# Patient Record
Sex: Female | Born: 1950 | Race: White | Hispanic: No | Marital: Married | Smoking: Never smoker
Health system: Southern US, Community
[De-identification: ages and names within clinical notes are randomized; demographics above are authoritative.]

## PROBLEM LIST (undated history)

## (undated) DIAGNOSIS — E079 Disorder of thyroid, unspecified: Secondary | ICD-10-CM

## (undated) DIAGNOSIS — M722 Plantar fascial fibromatosis: Secondary | ICD-10-CM

## (undated) DIAGNOSIS — F419 Anxiety disorder, unspecified: Secondary | ICD-10-CM

## (undated) DIAGNOSIS — K219 Gastro-esophageal reflux disease without esophagitis: Secondary | ICD-10-CM

## (undated) DIAGNOSIS — M1990 Unspecified osteoarthritis, unspecified site: Secondary | ICD-10-CM

## (undated) DIAGNOSIS — K760 Fatty (change of) liver, not elsewhere classified: Secondary | ICD-10-CM

## (undated) DIAGNOSIS — I10 Essential (primary) hypertension: Secondary | ICD-10-CM

## (undated) DIAGNOSIS — R011 Cardiac murmur, unspecified: Secondary | ICD-10-CM

## (undated) DIAGNOSIS — E785 Hyperlipidemia, unspecified: Secondary | ICD-10-CM

## (undated) DIAGNOSIS — Z8739 Personal history of other diseases of the musculoskeletal system and connective tissue: Secondary | ICD-10-CM

## (undated) HISTORY — PX: OTHER SURGICAL HISTORY: SHX169

## (undated) HISTORY — DX: Disorder of thyroid, unspecified: E07.9

## (undated) HISTORY — DX: Cardiac murmur, unspecified: R01.1

## (undated) HISTORY — DX: Gastro-esophageal reflux disease without esophagitis: K21.9

## (undated) HISTORY — DX: Hyperlipidemia, unspecified: E78.5

## (undated) HISTORY — DX: Fatty (change of) liver, not elsewhere classified: K76.0

## (undated) HISTORY — DX: Anxiety disorder, unspecified: F41.9

## (undated) HISTORY — DX: Plantar fascial fibromatosis: M72.2

## (undated) HISTORY — DX: Personal history of other diseases of the musculoskeletal system and connective tissue: Z87.39

## (undated) HISTORY — DX: Essential (primary) hypertension: I10

## (undated) HISTORY — DX: Unspecified osteoarthritis, unspecified site: M19.90

---

## 1982-09-02 HISTORY — PX: OTHER SURGICAL HISTORY: SHX169

## 1999-01-17 ENCOUNTER — Encounter: Admission: RE | Admit: 1999-01-17 | Discharge: 1999-04-17 | Payer: Self-pay

## 2000-02-05 ENCOUNTER — Other Ambulatory Visit: Admission: RE | Admit: 2000-02-05 | Discharge: 2000-02-05 | Payer: Self-pay | Admitting: Family Medicine

## 2001-09-01 ENCOUNTER — Other Ambulatory Visit: Admission: RE | Admit: 2001-09-01 | Discharge: 2001-09-01 | Payer: Self-pay | Admitting: Family Medicine

## 2005-01-24 ENCOUNTER — Other Ambulatory Visit: Admission: RE | Admit: 2005-01-24 | Discharge: 2005-01-24 | Payer: Self-pay | Admitting: Family Medicine

## 2007-11-19 ENCOUNTER — Encounter: Admission: RE | Admit: 2007-11-19 | Discharge: 2007-11-19 | Payer: Self-pay | Admitting: Otolaryngology

## 2009-08-07 ENCOUNTER — Emergency Department (HOSPITAL_COMMUNITY): Admission: EM | Admit: 2009-08-07 | Discharge: 2009-08-07 | Payer: Self-pay | Admitting: Internal Medicine

## 2009-08-08 ENCOUNTER — Encounter: Admission: RE | Admit: 2009-08-08 | Discharge: 2009-08-08 | Payer: Self-pay | Admitting: Orthopedic Surgery

## 2009-09-02 HISTORY — PX: COLONOSCOPY: SHX174

## 2010-01-17 ENCOUNTER — Emergency Department (HOSPITAL_COMMUNITY): Admission: EM | Admit: 2010-01-17 | Discharge: 2010-01-17 | Payer: Self-pay | Admitting: Emergency Medicine

## 2010-05-14 ENCOUNTER — Encounter (INDEPENDENT_AMBULATORY_CARE_PROVIDER_SITE_OTHER): Payer: Self-pay | Admitting: *Deleted

## 2010-05-16 ENCOUNTER — Encounter (INDEPENDENT_AMBULATORY_CARE_PROVIDER_SITE_OTHER): Payer: Self-pay | Admitting: *Deleted

## 2010-05-17 ENCOUNTER — Ambulatory Visit: Payer: Self-pay | Admitting: Gastroenterology

## 2010-05-28 ENCOUNTER — Telehealth: Payer: Self-pay | Admitting: Gastroenterology

## 2010-05-31 ENCOUNTER — Ambulatory Visit: Payer: Self-pay | Admitting: Gastroenterology

## 2010-06-05 ENCOUNTER — Encounter: Payer: Self-pay | Admitting: Gastroenterology

## 2010-09-02 HISTORY — PX: UPPER GASTROINTESTINAL ENDOSCOPY: SHX188

## 2010-10-02 NOTE — Miscellaneous (Signed)
Summary: [facility]  Clinical Lists Changes  Medications: Added new medication of MOVIPREP 100 GM  SOLR (PEG-KCL-NACL-NASULF-NA ASC-C) As per prep instructions. - Signed Rx of MOVIPREP 100 GM  SOLR (PEG-KCL-NACL-NASULF-NA ASC-C) As per prep instructions.;  #1 x 0;  Signed;  Entered by: Ezra Sites RN;  Authorized by: Meryl Dare MD Clementeen Graham;  Method used: Electron[facility] to [facility] [phone]*, [address] [place], [place], [place]  [address], Ph: [phone] or [phone], Fax: [phone] Allergies: Added new allergy or adverse reaction of SULFA Observations: Added new observation of NKA: F (05/17/2010 14:11)    Prescriptions: MOVIPREP 100 GM  SOLR (PEG-KCL-NACL-NASULF-NA ASC-C) As per prep instructions.  #1 x 0   Entered by:   Ezra Sites RN   Authorized by:   Meryl Dare MD [facility]   Signed by:   Ezra Sites RN on 05/17/2010   Method used:   Electronically to        [facility] [phone][facility] (retail)       [address]       [place], [place]  [address]       Ph: [phone] or [phone]       Fax: [phone]   RxID:   [id number]

## 2010-10-02 NOTE — Progress Notes (Signed)
Summary: doc note  Phone Note Call from Patient Call back at [phone]   Caller: Patient Call For: Dr. Russella Dar Reason for Call: Talk to Nurse Summary of Call: would like a note faxed to [phone] excusing her husband, Kelly Lewis, from work 05/31/2010 to be pt's care partner... please call pt when faxed  Initial call taken by: Vallarie Mare,  May 28, 2010 10:56 AM  Follow-up for Phone Call        Told pt that we can do a work excuse for her husband but it does have to be done the day of the procedure and not ahead of time. The pt has to arrive that day before we can give a work note. Pt verbalized understanding and will get it printed the day of the procedure.  Follow-up by: Christie Nottingham CMA Duncan Dull),  May 28, 2010 12:04 PM

## 2010-10-02 NOTE — Letter (Signed)
Summary: [facility] Instructions  [name]  [address] [place], [place] [address]   Phone: [phone]  Fax: [phone]       Kelly Lewis    1951-01-05    MRN: [id number]        Procedure Day /Date: Thursday 05/31/2010     Arrival Time: 8:00AM     Procedure Time: 9:00AM     Location of Procedure:                    _X_  [facility] (4th Floor)                       PREPARATION FOR COLONOSCOPY WITH MOVIPREP   Starting 5 days prior to your procedure 05/26/2010 do not eat nuts, seeds, popcorn, corn, beans, peas,  salads, or any raw vegetables.  Do not take any fiber supplements (e.g. Metamucil, Citrucel, and Benefiber).  THE DAY BEFORE YOUR PROCEDURE         DATE: 09/28  DAY: Thursday  1.  Drink clear liquids the entire day-NO SOLID FOOD  2.  Do not drink anything colored red or purple.  Avoid juices with pulp.  No orange juice.  3.  Drink at least 64 oz. (8 glasses) of fluid/clear liquids during the day to prevent dehydration and help the prep work efficiently.  CLEAR LIQUIDS INCLUDE: Water Jello Ice Popsicles Tea (sugar ok, no milk/cream) Powdered fruit flavored drinks Coffee (sugar ok, no milk/cream) Gatorade Juice: apple, white grape, white cranberry  Lemonade Clear bullion, consomm, broth Carbonated beverages (any kind) Strained chicken noodle soup Hard Candy                             4.  In the morning, mix first dose of MoviPrep solution:    Empty 1 Pouch A and 1 Pouch B into the disposable container    Add lukewarm drinking water to the top line of the container. Mix to dissolve    Refrigerate (mixed solution should be used within 24 hrs)  5.  Begin drinking the prep at 5:00 p.m. The MoviPrep container is divided by 4 marks.   Every 15 minutes drink the solution down to the next mark (approximately 8 oz) until the full liter is complete.   6.  Follow completed prep with 16 oz of clear liquid of your choice (Nothing  red or purple).  Continue to drink clear liquids until bedtime.  7.  Before going to bed, mix second dose of MoviPrep solution:    Empty 1 Pouch A and 1 Pouch B into the disposable container    Add lukewarm drinking water to the top line of the container. Mix to dissolve    Refrigerate  THE DAY OF YOUR PROCEDURE      DATE: 09/29    DAY: Thursday  Beginning at 4:00AM (5 hours before procedure):         1. Every 15 minutes, drink the solution down to the next mark (approx 8 oz) until the full liter is complete.  2. Follow completed prep with 16 oz. of clear liquid of your choice.    3. You may drink clear liquids until 7:00AM (2 HOURS BEFORE PROCEDURE).   MEDICATION INSTRUCTIONS  Unless otherwise instructed, you should take regular prescription medications with a small sip of water   as early as possible the morning of your procedure.  OTHER INSTRUCTIONS  You will need a responsible adult at least 60 years of age to accompany you and drive you home.   This person must remain in the waiting room during your procedure.  Wear loose fitting clothing that is easily removed.  Leave jewelry and other valuables at home.  However, you may wish to bring a book to read or  an iPod/MP3 player to listen to music as you wait for your procedure to start.  Remove all body piercing jewelry and leave at home.  Total time from sign-in until discharge is approximately 2-3 hours.  You should go home directly after your procedure and rest.  You can resume normal activities the  day after your procedure.  The day of your procedure you should not:   Drive   Make legal decisions   Operate machinery   Drink alcohol   Return to work  You will receive specific instructions about eating, activities and medications before you leave.    The above instructions have been reviewed and explained to me by   Ezra Sites RN  May 17, 2010 2:54 PM    I fully understand and  can verbalize these instructions _____________________________ Date _________

## 2010-10-02 NOTE — Letter (Signed)
Summary: Patient Notice- Polyp Results  [facility]  [address] [place], [place] [address]   Phone: [phone]  Fax: [phone]        June 05, 2010 MRN: [id number]    [facility] [address] [place], [place]  [address]    Dear Kelly Lewis,  I am pleased to inform you that the colon polyp(s) removed during your recent colonoscopy was (were) found to be benign (no cancer detected) upon pathologic examination.  I recommend you have a repeat colonoscopy examination in 5 years to look for recurrent polyps, as having colon polyps increases your risk for having recurrent polyps or even colon cancer in the future.  Should you develop new or worsening symptoms of abdominal pain, bowel habit changes or bleeding from the rectum or bowels, please schedule an evaluation with either your primary care physician or with me.  Continue treatment plan as outlined the day of your exam.  Please call us if you are having persistent problems or have questions about your condition that have not been fully answered at this time.  Sincerely,  Kelly Dare MD [facility]  This letter has been electronically signed by your physician.  Appended Document: Patient Notice- Polyp Results letter mailed

## 2010-10-02 NOTE — Procedures (Signed)
Summary: Colonoscopy  Patient: Kelly Lewis Note: All result statuses are Final unless otherwise noted.  Tests: (1) Colonoscopy (COL)   COL Colonoscopy           DONE     [facility]     [address]     [place], [place]  [address]           COLONOSCOPY PROCEDURE REPORT     PATIENT:  Kelly Lewis, Kelly Lewis  MR#:  [id number]     BIRTHDATE:  01-14-1951, 59 yrs. old  GENDER:  female     ENDOSCOPIST:  Judie Petit T. Russella Dar, MD, [facility]     Referred by:  Paulita Cradle, N.P.     PROCEDURE DATE:  05/31/2010     PROCEDURE:  Colonoscopy with biopsy     ASA CLASS:  Class II     INDICATIONS:  1) Routine Risk Screening     MEDICATIONS:   Fentanyl 75 mcg IV, Versed 8 mg IV     DESCRIPTION OF PROCEDURE:   After the risks benefits and     alternatives of the procedure were thoroughly explained, informed     consent was obtained.  Digital rectal exam was performed and     revealed no abnormalities.   The LB [id number]-[id number] endoscope     was introduced through the anus and advanced to the cecum, which     was identified by both the appendix and ileocecal valve, without     limitations.  The quality of the prep was good, using MoviPrep.     The instrument was then slowly withdrawn as the colon was fully     examined.     <<PROCEDUREIMAGES>>     FINDINGS:  A sessile polyp was found in the ascending colon. It     was 4 mm in size. The polyp was removed using cold biopsy forceps.     A normal appearing cecum, ileocecal valve, and appendiceal orifice     were identified. The hepatic flexure, transverse, splenic flexure,     descending, sigmoid colon, and rectum appeared unremarkable.     Retroflexed views in the rectum revealed no abnormalities.  The     time to cecum =  2  minutes. The scope was then withdrawn (time =     8.5  min) from the patient and the procedure completed.           COMPLICATIONS:  None           ENDOSCOPIC IMPRESSION:     1) 4 mm sessile polyp in the ascending colon        RECOMMENDATIONS:     1) Await pathology results     2) If the polyp removed today is adenomatous (pre-cancerous),     repeat colonoscopy in 5 years. Otherwise you should continue to     follow colorectal cancer screening for "routine risk" patients     with colonoscopy in 10 years.           Venita Lick. Russella Dar, MD, Clementeen Graham           n.     eSIGNED:   Venita Lick. Ocie Stanzione at 05/31/2010 09:17 AM           Burley Saver, [id number]  Note: An exclamation mark (!) indicates a result that was not dispersed into the flowsheet. Document Creation Date: 05/31/2010 9:19 AM _______________________________________________________________________  (1) Order result status: Final Collection or observation date-time: 05/31/2010 09:15 Requested date-time:  Receipt date-time:  Reported date-time:  Referring Physician:   Ordering Physician: Claudette Head [phone]) Specimen Source:  Source: Launa Grill Order Number: [id number] Lab site:   Appended Document: Colonoscopy     Procedures Next Due Date:    Colonoscopy: 05/2015

## 2010-10-02 NOTE — Letter (Signed)
Summary: Pre Visit Letter Revised  [facility]  [address] [place], [place] [address]   Phone: [phone]  Fax: [phone]        05/14/2010 MRN: [id number] [facility] [address] [place], [place]  [address]                               Procedure Date:  05-31-10  Welcome to the Gastroenterology Division at [facility].    You are scheduled to see a nurse for your pre-procedure visit on 05-17-10 at 2:30p.m. on the 3rd floor at [facility], [address]  We ask that you try to arrive at our office 15 minutes prior to your appointment time to allow for check-in.  Please take a minute to review the attached form.  If you answer "Yes" to one or more of the questions on the first page, we ask that you call the person listed at your earliest opportunity.  If you answer "No" to all of the questions, please complete the rest of the form and bring it to your appointment.    Your nurse visit will consist of discussing your medical and surgical history, your immediate family medical history, and your medications.   If you are unable to list all of your medications on the form, please bring the medication bottles to your appointment and we will list them.  We will need to be aware of both prescribed and over the counter drugs.  We will need to know exact dosage information as well.    Please be prepared to read and sign documents such as consent forms, a financial agreement, and acknowledgement forms.  If necessary, and with your consent, a friend or relative is welcome to sit-in on the nurse visit with you.  Please bring your insurance card so that we may make a copy of it.  If your insurance requires a referral to see a specialist, please bring your referral form from your primary care physician.  No co-pay is required for this nurse visit.     If you cannot keep your appointment, please call [phone] to cancel or reschedule prior to your appointment date.  This  allows [place] the opportunity to schedule an appointment for another patient in need of care.    Thank you for choosing [facility] for your medical needs.  We appreciate the opportunity to care for you.  Please visit [place] at our website  to learn more about our practice.  Sincerely, The Gastroenterology Division

## 2010-11-19 LAB — URINE CULTURE

## 2010-11-19 LAB — COMPREHENSIVE METABOLIC PANEL
ALT: 46 U/L — ABNORMAL HIGH (ref 0–35)
AST: 42 U/L — ABNORMAL HIGH (ref 0–37)
BUN: 13 mg/dL (ref 6–23)
CO2: 19 mEq/L (ref 19–32)
Creatinine, Ser: 0.89 mg/dL (ref 0.4–1.2)
Glucose, Bld: 131 mg/dL — ABNORMAL HIGH (ref 70–99)
Total Bilirubin: 1.2 mg/dL (ref 0.3–1.2)
Total Protein: 8.3 g/dL (ref 6.0–8.3)

## 2010-11-19 LAB — URINALYSIS, ROUTINE W REFLEX MICROSCOPIC
Glucose, UA: NEGATIVE mg/dL
Nitrite: POSITIVE — AB
Protein, ur: NEGATIVE mg/dL

## 2010-11-19 LAB — DIFFERENTIAL
Eosinophils Relative: 0 % (ref 0–5)
Lymphs Abs: 2.3 10*3/uL (ref 0.7–4.0)
Neutrophils Relative %: 71 % (ref 43–77)

## 2010-11-19 LAB — CBC
Hemoglobin: 15.1 g/dL — ABNORMAL HIGH (ref 12.0–15.0)
Platelets: 205 10*3/uL (ref 150–400)
RBC: 4.97 MIL/uL (ref 3.87–5.11)

## 2010-11-24 ENCOUNTER — Emergency Department (HOSPITAL_COMMUNITY): Payer: 59

## 2010-11-24 ENCOUNTER — Observation Stay (HOSPITAL_COMMUNITY)
Admission: EM | Admit: 2010-11-24 | Discharge: 2010-11-25 | Disposition: A | Discharge status: Home / Self Care | Payer: 59 | Attending: Internal Medicine | Admitting: Internal Medicine

## 2010-11-24 DIAGNOSIS — I10 Essential (primary) hypertension: Secondary | ICD-10-CM | POA: Insufficient documentation

## 2010-11-24 DIAGNOSIS — R079 Chest pain, unspecified: Principal | ICD-10-CM | POA: Insufficient documentation

## 2010-11-24 DIAGNOSIS — K7689 Other specified diseases of liver: Secondary | ICD-10-CM | POA: Insufficient documentation

## 2010-11-24 DIAGNOSIS — E785 Hyperlipidemia, unspecified: Secondary | ICD-10-CM | POA: Insufficient documentation

## 2010-11-24 DIAGNOSIS — K219 Gastro-esophageal reflux disease without esophagitis: Secondary | ICD-10-CM | POA: Insufficient documentation

## 2010-11-24 DIAGNOSIS — Z79899 Other long term (current) drug therapy: Secondary | ICD-10-CM | POA: Insufficient documentation

## 2010-11-24 DIAGNOSIS — E039 Hypothyroidism, unspecified: Secondary | ICD-10-CM | POA: Insufficient documentation

## 2010-11-24 DIAGNOSIS — R112 Nausea with vomiting, unspecified: Secondary | ICD-10-CM | POA: Insufficient documentation

## 2010-11-24 LAB — POCT I-STAT, CHEM 8
BUN: 12 mg/dL (ref 6–23)
Chloride: 103 mEq/L (ref 96–112)
Creatinine, Ser: 0.9 mg/dL (ref 0.4–1.2)
HCT: 43 % (ref 36.0–46.0)
Hemoglobin: 14.6 g/dL (ref 12.0–15.0)
Potassium: 3.9 mEq/L (ref 3.5–5.1)

## 2010-11-24 LAB — CBC
HCT: 40.4 % (ref 36.0–46.0)
Hemoglobin: 13.6 g/dL (ref 12.0–15.0)
MCH: 30 pg (ref 26.0–34.0)
RDW: 12.7 % (ref 11.5–15.5)
WBC: 8.1 10*3/uL (ref 4.0–10.5)

## 2010-11-24 LAB — DIFFERENTIAL
Basophils Absolute: 0 10*3/uL (ref 0.0–0.1)
Eosinophils Absolute: 0 10*3/uL (ref 0.0–0.7)
Eosinophils Relative: 0 % (ref 0–5)
Lymphocytes Relative: 4 % — ABNORMAL LOW (ref 12–46)
Monocytes Relative: 2 % — ABNORMAL LOW (ref 3–12)
Neutrophils Relative %: 93 % — ABNORMAL HIGH (ref 43–77)

## 2010-11-24 LAB — HEPATIC FUNCTION PANEL
Albumin: 3.9 g/dL (ref 3.5–5.2)
Alkaline Phosphatase: 137 U/L — ABNORMAL HIGH (ref 39–117)
Bilirubin, Direct: 0.1 mg/dL (ref 0.0–0.3)
Indirect Bilirubin: 0.6 mg/dL (ref 0.3–0.9)
Total Protein: 7.5 g/dL (ref 6.0–8.3)

## 2010-11-24 LAB — BRAIN NATRIURETIC PEPTIDE: Pro B Natriuretic peptide (BNP): <30 pg/mL (ref 0.0–100.0)

## 2010-11-24 LAB — POCT CARDIAC MARKERS

## 2010-11-25 ENCOUNTER — Other Ambulatory Visit: Payer: Self-pay | Admitting: Internal Medicine

## 2010-11-25 DIAGNOSIS — R072 Precordial pain: Secondary | ICD-10-CM

## 2010-11-25 LAB — COMPREHENSIVE METABOLIC PANEL
AST: 21 U/L (ref 0–37)
Albumin: 3.2 g/dL — ABNORMAL LOW (ref 3.5–5.2)
CO2: 26 mEq/L (ref 19–32)
Calcium: 8.7 mg/dL (ref 8.4–10.5)
Creatinine, Ser: 0.74 mg/dL (ref 0.4–1.2)
GFR calc non Af Amer: >60 mL/min (ref >60)
Total Protein: 6 g/dL (ref 6.0–8.3)

## 2010-11-25 LAB — CBC
HCT: 35.1 % — ABNORMAL LOW (ref 36.0–46.0)
Hemoglobin: 11.5 g/dL — ABNORMAL LOW (ref 12.0–15.0)
MCV: 90.2 fL (ref 78.0–100.0)
RBC: 3.89 MIL/uL (ref 3.87–5.11)
WBC: 4.7 10*3/uL (ref 4.0–10.5)

## 2010-11-25 LAB — TSH: TSH: 0.399 u[IU]/mL (ref 0.350–4.500)

## 2010-11-25 LAB — TROPONIN I: Troponin I: <0.01 ng/mL (ref 0.00–0.06)

## 2010-11-25 LAB — LIPID PANEL
Cholesterol: 146 mg/dL (ref 0–200)
Triglycerides: 44 mg/dL (ref <150)

## 2010-11-25 LAB — CK TOTAL AND CKMB (NOT AT ARMC)
CK, MB: 0.7 ng/mL (ref 0.3–4.0)
CK, MB: 0.8 ng/mL (ref 0.3–4.0)
Total CK: 66 U/L (ref 7–177)

## 2010-11-25 LAB — PHOSPHORUS: Phosphorus: 3.7 mg/dL (ref 2.3–4.6)

## 2010-11-28 ENCOUNTER — Encounter: Payer: Self-pay | Admitting: Gastroenterology

## 2010-12-04 NOTE — Letter (Signed)
Summary: New Patient letter  [facility]  [address] [place], [place] [address]   Phone: [phone]  Fax: [phone]       11/28/2010 MRN: [id number]  [facility] [address] [place], [place]  [address]  Dear Ms. Snowden,  Welcome to the Gastroenterology Division at [facility].    You are scheduled to see Dr.  Russella Dar on 01-07-11 at 3pm on the 3rd floor at [facility], [address].  We ask that you try to arrive at our office 15 minutes prior to your appointment time to allow for check-in.  We would like you to complete the enclosed self-administered evaluation form prior to your visit and bring it with you on the day of your appointment.  We will review it with you.  Also, please bring a complete list of all your medications or, if you prefer, bring the medication bottles and we will list them.  Please bring your insurance card so that we may make a copy of it.  If your insurance requires a referral to see a specialist, please bring your referral form from your primary care physician.  Co-payments are due at the time of your visit and may be paid by cash, check or credit card.     Your office visit will consist of a consult with your physician (includes a physical exam), any laboratory testing he/she may order, scheduling of any necessary diagnostic testing (e.g. x-ray, ultrasound, CT-scan), and scheduling of a procedure (e.g. Endoscopy, Colonoscopy) if required.  Please allow enough time on your schedule to allow for any/all of these possibilities.    If you cannot keep your appointment, please call [phone] to cancel or reschedule prior to your appointment date.  This allows [place] the opportunity to schedule an appointment for another patient in need of care.  If you do not cancel or reschedule by 5 p.m. the business day prior to your appointment date, you will be charged a $50.00 late cancellation/no-show fee.    Thank you for choosing [facility] for your medical needs.  We appreciate the opportunity to care for you.  Please visit [place] at our website  to learn more about our practice.                     Sincerely,                                                             The Gastroenterology Division

## 2010-12-18 NOTE — H&P (Signed)
Kelly Lewis, Kelly Lewis                 ACCOUNT NO.:  [id number]  MEDICAL RECORD NO.:  [id number]           PATIENT TYPE:  O  LOCATION:  1425                         FACILITY:  [facility]  PHYSICIAN:  Lonia Blood, M.D.      DATE OF BIRTH:  Jan 25, 1951  DATE OF ADMISSION:  11/24/2010 DATE OF DISCHARGE:                             HISTORY & PHYSICAL   PRIMARY CARE PHYSICIAN:  The patient is unassigned to [place].  PRESENTING COMPLAINT:  Chest pain.  HISTORY OF PRESENT ILLNESS:  The patient is a 60 year old female with history of hypertension and hypothyroidism who presented with chest pain plus flutter all of this week.  She saw her primary care physician who performed an EKG at the office that showed it was normal.  She was scheduled to have a 2-D echo this coming week.  Chest pain, however, continued and got worse, so she decided to come in for further management.  She rated the pain as 8/10 at its highest.  It was relieved somehow with some nitroglycerin here in the ED and she is currently chest pain-free.  She denied any diaphoresis.  No radiation.  No dizziness, but she has been having nausea and has had some vomiting even here in the ED.  Chest pain is more discomfort and at this height of her discomfort she did feel it radiate into her shoulder and jaw, but not today.  She has risk factors for coronary artery disease, mainly obesity, hypertension.  PAST MEDICAL HISTORY:  Significant for: 1. Hypothyroidism. 2. Hypertension. 3. GERD. 4. Morbid obesity.  ALLERGIES:  SULFA.  CURRENT MEDICATIONS: 1. Levothyroxine 100 mcg daily. 2. Lansoprazole 600 mg daily. 3. Lorazepam 0.5 mg daily. 4. Lisinopril 5 mg daily.  SOCIAL HISTORY:  She is married and lives in [place] with her husband. She denied any tobacco.  No alcohol.  No IV drug use.  FAMILY HISTORY:  Denied any significant family history for coronary artery disease.  REVIEW OF SYSTEMS:  All systems reviewed are currently negative  except per HPI.  PHYSICAL EXAMINATION:  VITAL SIGNS:  Temperature 98.6, initial blood pressure 138/72 with a pulse 113, respiratory 18, sats 98% room air. GENERAL:  She is awake, alert, oriented, obese woman.  She is in no acute distress. HEENT:  PERRL.  EOMI.  No pallor.  No jaundice.  No rhinorrhea. NECK:  Supple.  No JVD.  No lymphadenopathy. RESPIRATORY:  She has good air entry bilaterally.  No wheezes.  No rales.  No crackles. CARDIOVASCULAR SYSTEM:  She is slightly tachycardic. ABDOMEN:  Obese, soft, full, nontender with positive bowel sounds. EXTREMITIES:  No edema, cyanosis, or clubbing. MUSCULOSKELETAL:  No joint deformities or tenderness. SKIN:  No rashes.  No ulcers.  LABORATORY DATA:  Her white count is 8.1, hemoglobin 13.6 with platelet count 152.  Sodium 137, potassium 3.9, chloride 106, BUN 12, creatinine 0.9, glucose 124.  Her LFTs showed only mildly elevated alkaline phosphatase.  Initial cardiac enzymes are negative.  BNP is less than 30.  Chest x-ray showed negative exam.  Abdominal ultrasound showed fatty infiltration of the liver, but no acute  findings.  EKG showed normal sinus rhythm with a rate of 93, is low voltage EKG, no significant ST-T wave changes.  ASSESSMENT:  This is a 60 year old female presenting with chest pain. The patient has some mild to moderate risk factors for coronary artery disease, but she given a classic cardiac chest pain pattern.  PLAN: 1. Chest pain.  We will admit the patient for observation and rule out     MI.  I will check serial cardiac enzymes.  I will give her     nitroglycerin sublingual as needed and some morphine for pain     control.  Put her on some oxygen as well.  I will put her on some     Lovenox for DVT prophylaxis mainly.  I suspect if this is negative     the patient will need an echo as well as an outpatient stress test. 2. GERD.  We will put her on PPIs.  I will put her on b.i.d. due to     her symptoms does  seem to be GERD related. 3. Hypothyroidism.  Check TSH and continue with her Synthroid. 4. Mild dyslipidemia per patient, which is an additional risk factor.     I will check fasting lipid and if we have to we will start her on a     statin.  She says she is currently on diet control. 5. Hypertension.  Blood pressure is controlled on her home regimen. 6. Nausea, vomiting.  Again this could be related to her GI     symptomatology.  The patient was worried about gallbladder.  So far     her right upper quadrant abdominal ultrasound is negative for any     gallbladder disease.  We will continue with symptomatic management     of her nausea and vomiting.     Lonia Blood, M.D.     Verlin Grills  D:  11/25/2010  T:  11/25/2010  Job:  [id number]  Electronically Signed by Lonia Blood M.D. on 12/18/2010 03:43:43 PM

## 2010-12-27 ENCOUNTER — Other Ambulatory Visit: Payer: Self-pay | Admitting: Gastroenterology

## 2011-01-07 ENCOUNTER — Ambulatory Visit: Payer: 59 | Admitting: Gastroenterology

## 2012-02-22 IMAGING — US US ABDOMEN COMPLETE
1 series · 14 of 25 positions shown · non-contrast
Comparison: None

CLINICAL DATA: Epigastric pain.

COMPLETE ABDOMINAL ULTRASOUND

[Series 1: us abdomen complete · 0.30mm/px · 14 of 59 slices shown]
[im 1/59]
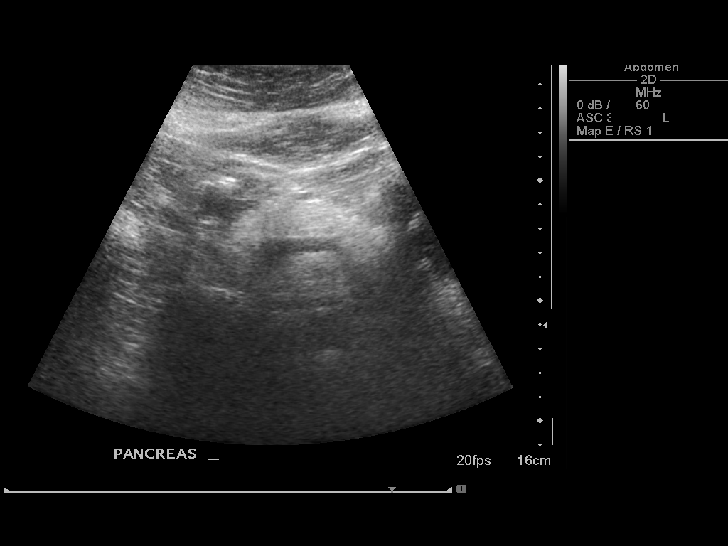
[im 5/59]
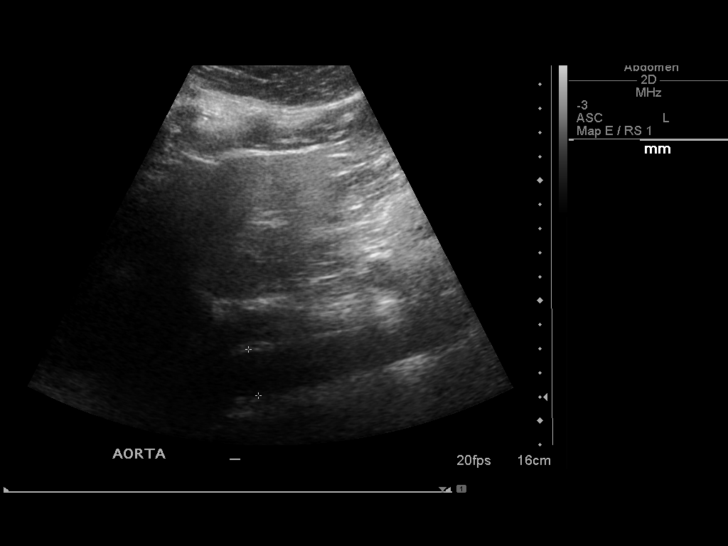
[im 10/59]
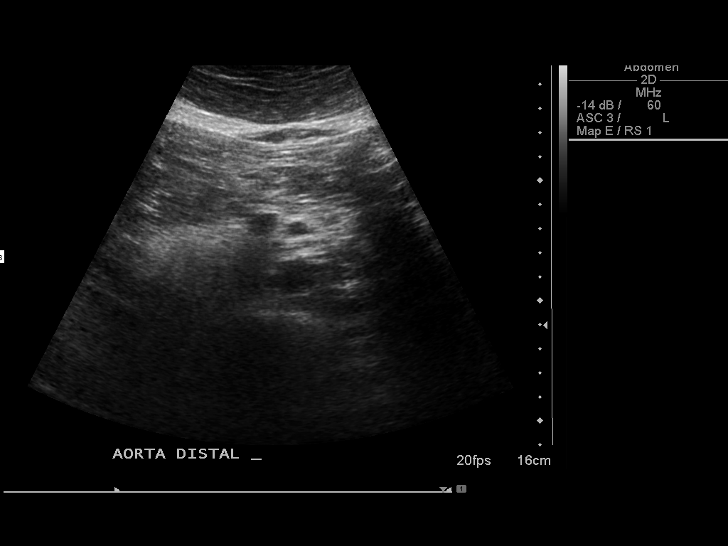
[im 15/59]
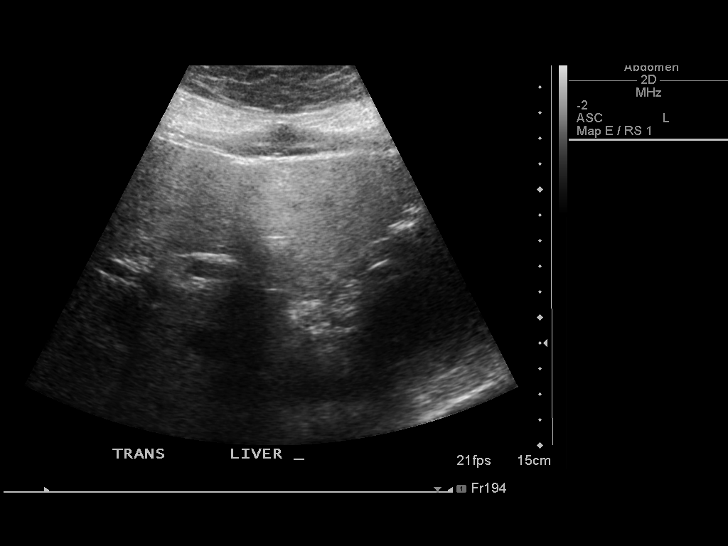
[im 20/59]
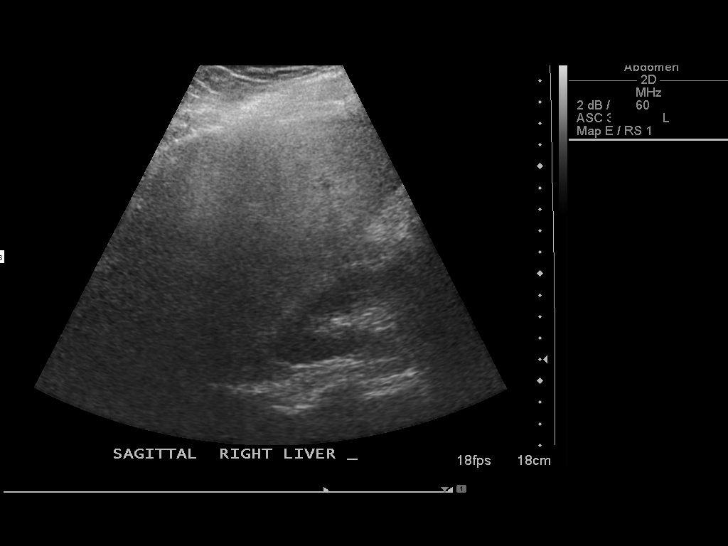
[im 22/59]
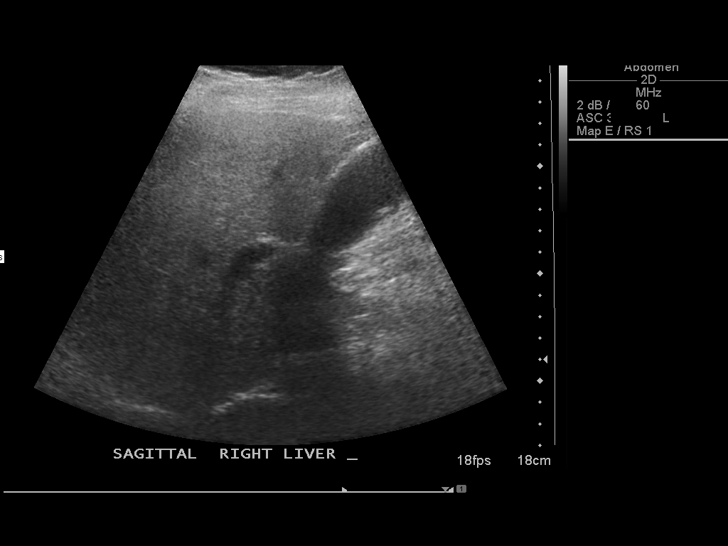
[im 27/59]
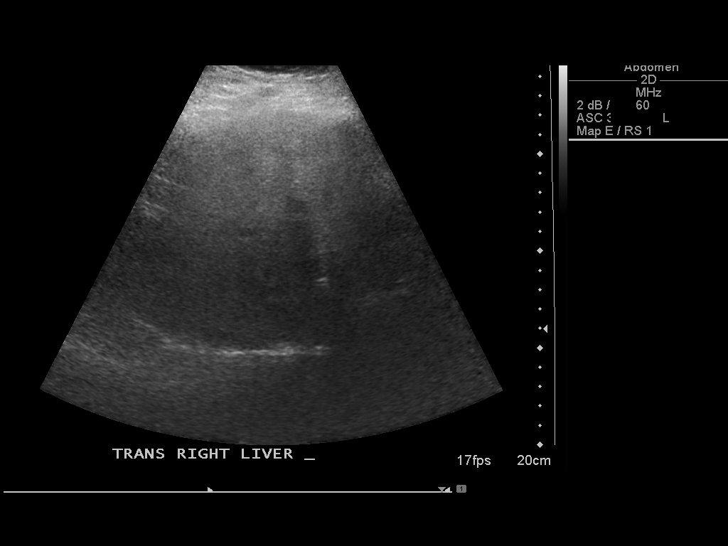
[im 32/59]
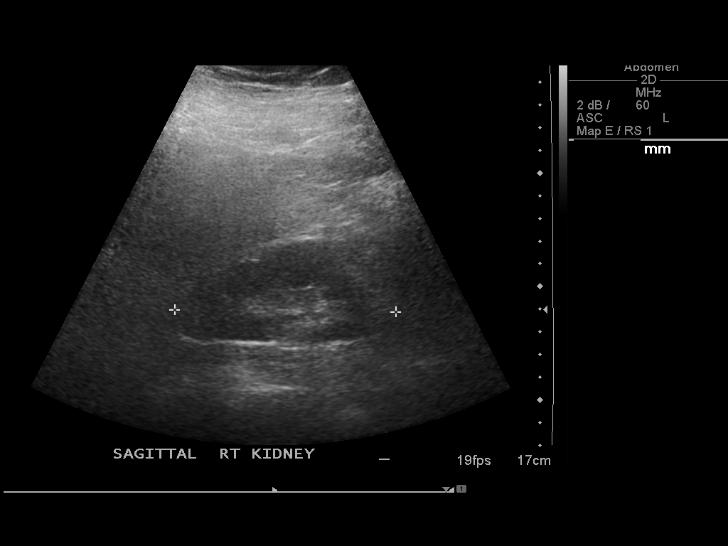
[im 37/59]
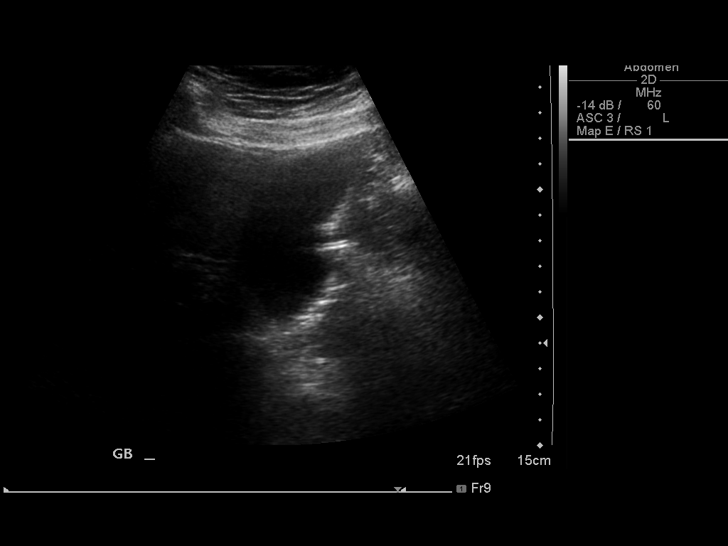
[im 39/59]
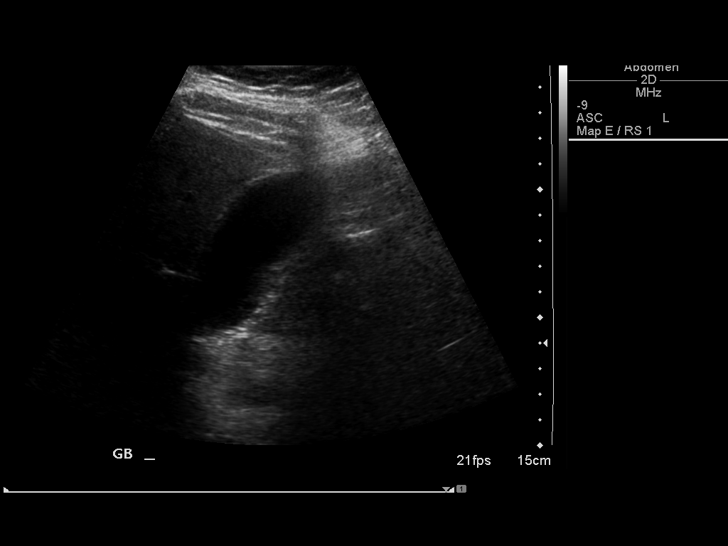
[im 44/59]
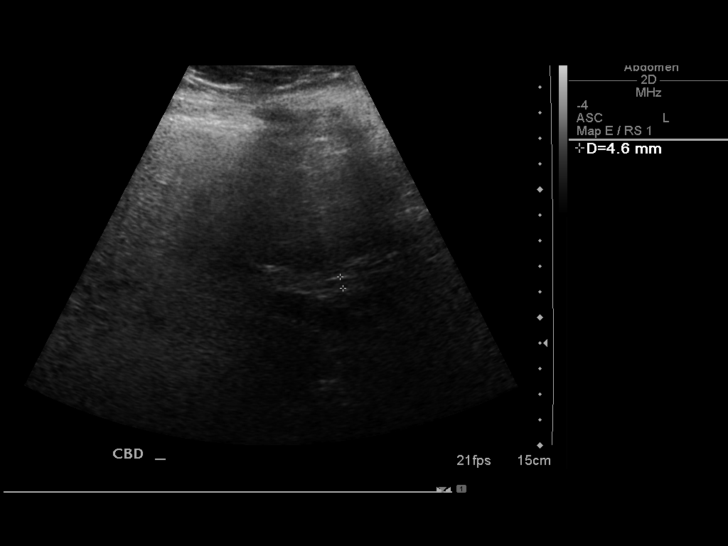
[im 49/59]
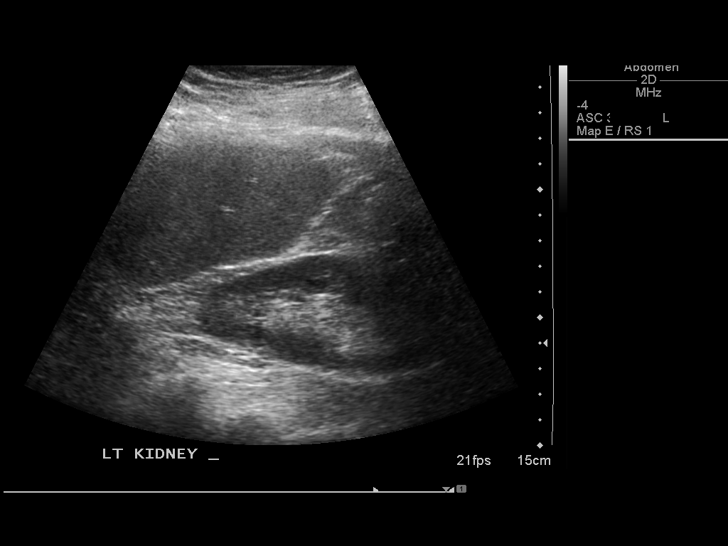
[im 54/59]
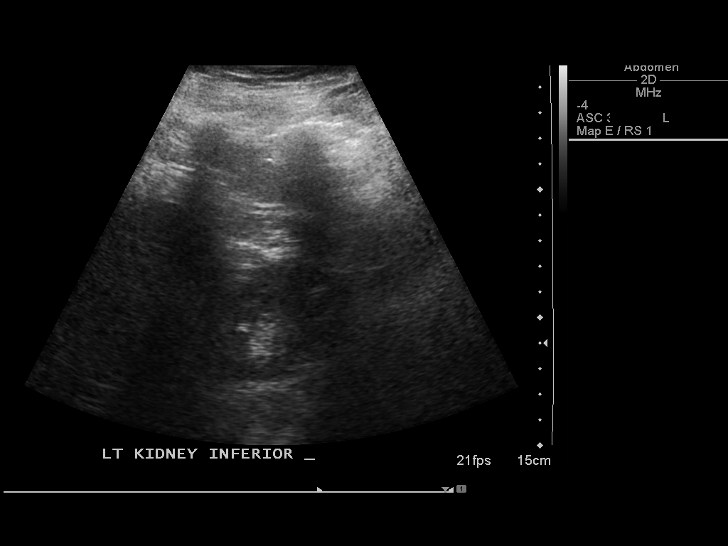
[im 59/59]
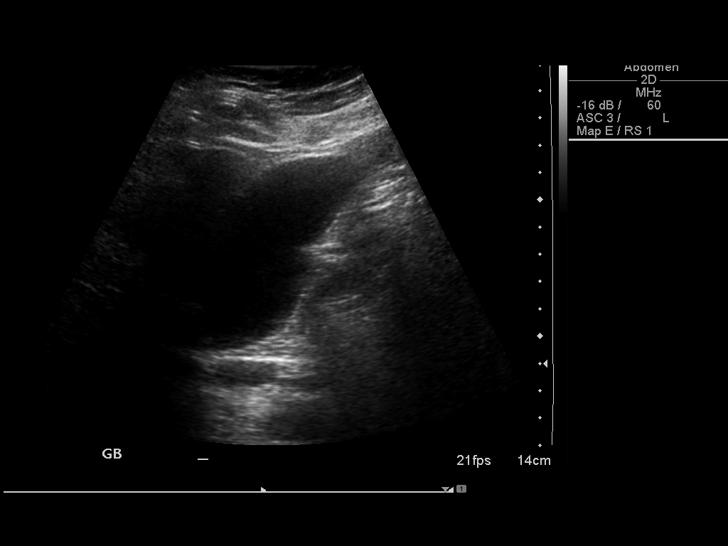

[14 of 25 positions shown; findings below may reference images not displayed]

FINDINGS: Gallbladder:  No gallstones, gallbladder wall thickening, or
pericholecystic fluid.

Common bile duct:   Within normal limits in caliber.

Liver:  Coarsened, increased echotexture compatible with fatty
infiltration or intrinsic liver disease.  There is no biliary
ductal dilatation.

IVC:  Appears normal.

Pancreas:  No focal abnormality seen.

Spleen:  Within normal limits in size and echotexture.

Right Kidney:   Normal in size and parenchymal echogenicity.  No
evidence of mass or hydronephrosis.

Left Kidney:  Normal in size and parenchymal echogenicity.  No
evidence of mass or hydronephrosis.

Abdominal aorta:  No aneurysm identified.
IMPRESSION: Fatty infiltration of the liver.  No acute findings.

## 2012-12-09 ENCOUNTER — Other Ambulatory Visit: Payer: Self-pay | Admitting: *Deleted

## 2012-12-09 MED ORDER — LEVOTHYROXINE SODIUM 75 MCG PO TABS: 75.0000 ug | ORAL_TABLET | Freq: Every day | ORAL | Status: DC

## 2012-12-21 ENCOUNTER — Telehealth: Payer: Self-pay | Admitting: Nurse Practitioner

## 2012-12-21 MED ORDER — DEXLANSOPRAZOLE 60 MG PO CPDR: 60.0000 mg | DELAYED_RELEASE_CAPSULE | Freq: Every day | ORAL | Status: DC

## 2012-12-21 NOTE — Telephone Encounter (Signed)
Done 12/21/12

## 2013-01-07 ENCOUNTER — Encounter: Payer: Self-pay | Admitting: Nurse Practitioner

## 2013-01-07 ENCOUNTER — Ambulatory Visit (INDEPENDENT_AMBULATORY_CARE_PROVIDER_SITE_OTHER): Payer: 59 | Admitting: Nurse Practitioner

## 2013-01-07 VITALS — BP 118/67 | HR 79 | Temp 98.4°F | Ht 66.5 in | Wt 226.0 lb

## 2013-01-07 DIAGNOSIS — E785 Hyperlipidemia, unspecified: Secondary | ICD-10-CM

## 2013-01-07 DIAGNOSIS — I10 Essential (primary) hypertension: Secondary | ICD-10-CM

## 2013-01-07 DIAGNOSIS — K219 Gastro-esophageal reflux disease without esophagitis: Secondary | ICD-10-CM

## 2013-01-07 DIAGNOSIS — E039 Hypothyroidism, unspecified: Secondary | ICD-10-CM | POA: Insufficient documentation

## 2013-01-07 DIAGNOSIS — F411 Generalized anxiety disorder: Secondary | ICD-10-CM

## 2013-01-07 LAB — COMPLETE METABOLIC PANEL WITH GFR
ALT: 46 U/L — ABNORMAL HIGH (ref 0–35)
CO2: 26 mEq/L (ref 19–32)
Calcium: 10.2 mg/dL (ref 8.4–10.5)
Chloride: 100 mEq/L (ref 96–112)
Creat: 0.91 mg/dL (ref 0.50–1.10)
GFR, Est African American: 78 mL/min
Glucose, Bld: 92 mg/dL (ref 70–99)
Total Protein: 7.7 g/dL (ref 6.0–8.3)

## 2013-01-07 LAB — THYROID PANEL WITH TSH: TSH: 8.243 u[IU]/mL — ABNORMAL HIGH (ref 0.350–4.500)

## 2013-01-07 MED ORDER — LISINOPRIL 5 MG PO TABS: 5.0000 mg | ORAL_TABLET | Freq: Every day | ORAL | Status: DC

## 2013-01-07 MED ORDER — LORAZEPAM 0.5 MG PO TABS: 0.5000 mg | ORAL_TABLET | Freq: Two times a day (BID) | ORAL | Status: DC | PRN

## 2013-01-07 MED ORDER — DEXLANSOPRAZOLE 60 MG PO CPDR: 60.0000 mg | DELAYED_RELEASE_CAPSULE | Freq: Every day | ORAL | Status: DC

## 2013-01-07 NOTE — Patient Instructions (Signed)

## 2013-01-07 NOTE — Progress Notes (Signed)
Subjective:    Patient ID: Kelly Lewis, female    DOB: 29-Nov-1950, 62 y.o.   MRN: [id number]  Hypertension This is a chronic problem. The current episode started more than 1 year ago. The problem has been resolved since onset. The problem is controlled. Associated symptoms include anxiety. Pertinent negatives include no blurred vision, chest pain, headaches, palpitations, peripheral edema or shortness of breath. Agents associated with hypertension include thyroid hormones. Risk factors for coronary artery disease include obesity. Past treatments include ACE inhibitors. The current treatment provides significant improvement. Compliance problems include exercise and diet.   Gastrophageal Reflux She reports no chest pain, no coughing, no dysphagia, no heartburn, no sore throat or no wheezing. This is a chronic problem. The current episode started more than 1 year ago. The problem occurs rarely. The problem has been waxing and waning. The symptoms are aggravated by certain foods (spicy). She has tried a histamine-2 antagonist for the symptoms. The treatment provided moderate relief. Past procedures include an EGD. Past procedures do not include H. pylori antibody titer.  hyperlipidemia They wanted patient to start to medication at last check but patient refused. Has been dieting and wants to see if has improved. Hasn't been exercising very often GAD Mother is 60 years old and she is helping take care of her which makes her very anxious Hypothyroidism Levothyroxine daily- Tolerating well- No C/O fatigue.  Review of Systems  HENT: Negative for sore throat.   Eyes: Negative for blurred vision.  Respiratory: Negative for cough, shortness of breath and wheezing.   Cardiovascular: Negative for chest pain and palpitations.  Gastrointestinal: Negative for heartburn and dysphagia.  Neurological: Negative for headaches.  All other systems reviewed and are negative.       Objective:   Physical  Exam  Constitutional: She is oriented to person, place, and time. She appears well-developed and well-nourished.  HENT:  Nose: Nose normal.  Mouth/Throat: Oropharynx is clear and moist.  Eyes: EOM are normal.  Neck: Trachea normal, normal range of motion and full passive range of motion without pain. Neck supple. No JVD present. Carotid bruit is not present. No thyromegaly present.  Cardiovascular: Normal rate, regular rhythm, normal heart sounds and intact distal pulses.  Exam reveals no gallop and no friction rub.   No murmur heard. Pulmonary/Chest: Effort normal and breath sounds normal.  Abdominal: Soft. Bowel sounds are normal. She exhibits no distension and no mass. There is no tenderness.  Musculoskeletal: Normal range of motion.  Lymphadenopathy:    She has no cervical adenopathy.  Neurological: She is alert and oriented to person, place, and time. She has normal reflexes.  Skin: Skin is warm and dry.  Psychiatric: She has a normal mood and affect. Her behavior is normal. Judgment and thought content normal.   BP 118/67  Pulse 79  Temp(Src) 98.4 F (36.9 C) (Oral)  Ht 5' 6.5" (1.689 m)  Wt 226 lb (102.513 kg)  BMI 35.94 kg/m2        Assessment & Plan:  1. GAD (generalized anxiety disorder) Stress management - LORazepam (ATIVAN) 0.5 MG tablet; Take 1 tablet (0.5 mg total) by mouth 2 (two) times daily as needed for anxiety.  Dispense: 30 tablet; Refill: 0  2. Hypertension Low Na+ diet - COMPLETE METABOLIC PANEL WITH GFR - lisinopril (PRINIVIL,ZESTRIL) 5 MG tablet; Take 1 tablet (5 mg total) by mouth daily.  Dispense: 90 tablet; Refill: 1  3. Hypothyroidism  - Thyroid Panel With TSH  4. GERD (  gastroesophageal reflux disease) Avoid spicy and fatty foods Do Not eat 2 hours prior to bedtime - dexlansoprazole (DEXILANT) 60 MG capsule; Take 1 capsule (60 mg total) by mouth daily.  Dispense: 30 capsule; Refill: 5  5. Hyperlipidemia Low fat diet an dexercise - NMR  Lipoprofile with Lipids  Mary-Margaret Daphine Deutscher, FNP

## 2013-01-12 ENCOUNTER — Other Ambulatory Visit: Payer: Self-pay | Admitting: Nurse Practitioner

## 2013-01-12 LAB — NMR LIPOPROFILE WITH LIPIDS
HDL Particle Number: 29.8 umol/L — ABNORMAL LOW (ref >=30.5)
LDL Size: 21.5 nm (ref >20.5)
Large HDL-P: 6.1 umol/L (ref >=4.8)
Small LDL Particle Number: 477 nmol/L (ref <=527)

## 2013-01-12 MED ORDER — LEVOTHYROXINE SODIUM 100 MCG PO TABS: 100.0000 ug | ORAL_TABLET | Freq: Every day | ORAL | Status: DC

## 2013-01-12 MED ORDER — ATORVASTATIN CALCIUM 40 MG PO TABS: 40.0000 mg | ORAL_TABLET | Freq: Every day | ORAL | Status: DC

## 2013-01-13 ENCOUNTER — Telehealth: Payer: Self-pay | Admitting: Nurse Practitioner

## 2013-01-13 NOTE — Telephone Encounter (Signed)
Spoke with patient.

## 2013-01-13 NOTE — Progress Notes (Signed)
We will keep a close eye on her liver- Ok to continue both meds

## 2013-01-20 ENCOUNTER — Encounter: Payer: Self-pay | Admitting: Nurse Practitioner

## 2013-01-20 NOTE — Telephone Encounter (Signed)
Please advise 

## 2013-01-28 ENCOUNTER — Encounter: Payer: Self-pay | Admitting: Nurse Practitioner

## 2013-02-12 ENCOUNTER — Encounter: Payer: Self-pay | Admitting: Nurse Practitioner

## 2013-02-12 ENCOUNTER — Other Ambulatory Visit: Payer: Self-pay | Admitting: *Deleted

## 2013-02-12 DIAGNOSIS — F411 Generalized anxiety disorder: Secondary | ICD-10-CM

## 2013-02-12 MED ORDER — LORAZEPAM 0.5 MG PO TABS: 0.5000 mg | ORAL_TABLET | Freq: Two times a day (BID) | ORAL | Status: DC | PRN

## 2013-02-12 NOTE — Telephone Encounter (Signed)
Message from [facility],   As you requested I am letting you know I need a prescription called in for Lorazepam 0.5. The last prescription was for only 15 days. Thank you and have a good day.   Burley Saver

## 2013-02-12 NOTE — Telephone Encounter (Signed)
Please call in ativan rx with 1 refill 

## 2013-02-12 NOTE — Telephone Encounter (Signed)
Up front 

## 2013-03-02 ENCOUNTER — Other Ambulatory Visit (INDEPENDENT_AMBULATORY_CARE_PROVIDER_SITE_OTHER): Payer: 59

## 2013-03-02 DIAGNOSIS — E039 Hypothyroidism, unspecified: Secondary | ICD-10-CM

## 2013-03-02 LAB — THYROID PANEL WITH TSH
Free Thyroxine Index: 4.4 — ABNORMAL HIGH (ref 1.0–3.9)
T3 Uptake: 29.9 % (ref 22.5–37.0)
T4, Total: 14.8 ug/dL — ABNORMAL HIGH (ref 5.0–12.5)
TSH: 2.809 u[IU]/mL (ref 0.350–4.500)

## 2013-05-11 ENCOUNTER — Other Ambulatory Visit: Payer: Self-pay | Admitting: Nurse Practitioner

## 2013-05-13 NOTE — Telephone Encounter (Signed)
LAST RF 03/31/13. LAST OV 01/07/13. CALL IN [facility] IF APPROVED.

## 2013-05-13 NOTE — Telephone Encounter (Signed)
Please call in xanax rx with 1 refill 

## 2013-05-14 ENCOUNTER — Other Ambulatory Visit: Payer: Self-pay | Admitting: Nurse Practitioner

## 2013-05-14 NOTE — Telephone Encounter (Signed)
[facility] 

## 2013-05-17 NOTE — Telephone Encounter (Signed)
Last seen 01/07/13  MMM   If approved route to nurse to [facility]

## 2013-05-17 NOTE — Telephone Encounter (Signed)
Please call in ativan rx with 1 refill 

## 2013-05-17 NOTE — Telephone Encounter (Signed)
[facility] 

## 2013-07-06 ENCOUNTER — Other Ambulatory Visit: Payer: Self-pay | Admitting: Nurse Practitioner

## 2013-07-08 ENCOUNTER — Other Ambulatory Visit: Payer: Self-pay

## 2013-07-08 ENCOUNTER — Other Ambulatory Visit: Payer: Self-pay | Admitting: Nurse Practitioner

## 2013-07-09 ENCOUNTER — Other Ambulatory Visit: Payer: Self-pay | Admitting: Nurse Practitioner

## 2013-09-22 ENCOUNTER — Other Ambulatory Visit: Payer: Self-pay | Admitting: Nurse Practitioner

## 2013-09-24 ENCOUNTER — Telehealth: Payer: Self-pay | Admitting: Nurse Practitioner

## 2013-09-24 MED ORDER — LORAZEPAM 0.5 MG PO TABS: 0.5000 mg | ORAL_TABLET | Freq: Two times a day (BID) | ORAL | Status: DC

## 2013-09-24 NOTE — Telephone Encounter (Signed)
Please call in lorazepam 0.5 mg 1 po BID prn #60 0 refill- NTBS for follow up

## 2013-09-24 NOTE — Telephone Encounter (Signed)
[facility]

## 2013-10-07 ENCOUNTER — Other Ambulatory Visit: Payer: Self-pay | Admitting: Nurse Practitioner

## 2013-10-11 NOTE — Telephone Encounter (Signed)
ntbs

## 2013-10-11 NOTE — Telephone Encounter (Signed)
Last seen 01/07/13  MMM Requesting a 90 day supply

## 2013-10-15 ENCOUNTER — Ambulatory Visit: Payer: 59 | Admitting: Nurse Practitioner

## 2013-10-19 ENCOUNTER — Ambulatory Visit: Payer: BC Managed Care – PPO | Admitting: Nurse Practitioner

## 2013-10-23 ENCOUNTER — Encounter: Payer: Self-pay | Admitting: Family Medicine

## 2013-11-10 ENCOUNTER — Other Ambulatory Visit: Payer: Self-pay | Admitting: Nurse Practitioner

## 2013-11-11 NOTE — Telephone Encounter (Signed)
Please call in lorazepam with 1 refills 

## 2013-11-11 NOTE — Telephone Encounter (Signed)
rx called into pharmacy

## 2013-11-11 NOTE — Telephone Encounter (Signed)
Patient last seen in office on 01-07-13. Rx last filled on 09-24-13 for #60. Please advise. If approved please route to Pool B so nurse can phone in to pharmacy

## 2014-01-04 ENCOUNTER — Other Ambulatory Visit: Payer: Self-pay | Admitting: *Deleted

## 2014-01-04 MED ORDER — LEVOTHYROXINE SODIUM 100 MCG PO TABS: ORAL_TABLET | ORAL | Status: DC

## 2014-01-17 ENCOUNTER — Other Ambulatory Visit: Payer: Self-pay

## 2014-01-17 MED ORDER — LISINOPRIL 5 MG PO TABS: ORAL_TABLET | ORAL | Status: DC

## 2014-01-17 NOTE — Telephone Encounter (Signed)
Last seen 01/07/13  MMM   Requesting 90 day supply

## 2014-01-17 NOTE — Telephone Encounter (Signed)
Can only have 30 day supply- has not been seen in over a year. Patient NTBS for follow up and lab work

## 2014-02-13 ENCOUNTER — Other Ambulatory Visit: Payer: Self-pay | Admitting: Nurse Practitioner

## 2014-02-15 ENCOUNTER — Telehealth: Payer: Self-pay | Admitting: Nurse Practitioner

## 2014-02-15 ENCOUNTER — Encounter: Payer: Self-pay | Admitting: Nurse Practitioner

## 2014-02-15 NOTE — Telephone Encounter (Signed)
Appt scheduled for 03/21/14. Last seen 01/07/13. Requesting refill on lisinopril and lorazepam to last until scheduled appt.

## 2014-02-16 MED ORDER — LISINOPRIL 5 MG PO TABS: ORAL_TABLET | ORAL | Status: DC

## 2014-02-16 MED ORDER — LORAZEPAM 0.5 MG PO TABS: ORAL_TABLET | ORAL | Status: DC

## 2014-02-16 NOTE — Telephone Encounter (Signed)
Called into [facility]

## 2014-02-16 NOTE — Telephone Encounter (Signed)
Please call in lorazepam 0.5mg  1 po BID #60 with 0 refills

## 2014-02-17 ENCOUNTER — Ambulatory Visit: Payer: 59 | Admitting: Nurse Practitioner

## 2014-03-21 ENCOUNTER — Encounter: Payer: Self-pay | Admitting: Nurse Practitioner

## 2014-03-21 ENCOUNTER — Ambulatory Visit (INDEPENDENT_AMBULATORY_CARE_PROVIDER_SITE_OTHER): Payer: 59 | Admitting: Nurse Practitioner

## 2014-03-21 VITALS — BP 134/80 | HR 84 | Temp 98.8°F | Ht 66.5 in | Wt 223.0 lb

## 2014-03-21 DIAGNOSIS — Z6835 Body mass index (BMI) 35.0-35.9, adult: Secondary | ICD-10-CM

## 2014-03-21 DIAGNOSIS — Z713 Dietary counseling and surveillance: Secondary | ICD-10-CM

## 2014-03-21 DIAGNOSIS — E039 Hypothyroidism, unspecified: Secondary | ICD-10-CM

## 2014-03-21 DIAGNOSIS — F411 Generalized anxiety disorder: Secondary | ICD-10-CM

## 2014-03-21 DIAGNOSIS — I10 Essential (primary) hypertension: Secondary | ICD-10-CM

## 2014-03-21 DIAGNOSIS — K219 Gastro-esophageal reflux disease without esophagitis: Secondary | ICD-10-CM

## 2014-03-21 MED ORDER — OMEPRAZOLE MAGNESIUM 20 MG PO TBEC: 20.0000 mg | DELAYED_RELEASE_TABLET | Freq: Every day | ORAL | Status: DC

## 2014-03-21 MED ORDER — LORAZEPAM 0.5 MG PO TABS: ORAL_TABLET | ORAL | Status: DC

## 2014-03-21 MED ORDER — LISINOPRIL 5 MG PO TABS: ORAL_TABLET | ORAL | Status: DC

## 2014-03-21 MED ORDER — LEVOTHYROXINE SODIUM 100 MCG PO TABS: ORAL_TABLET | ORAL | Status: DC

## 2014-03-21 NOTE — Patient Instructions (Signed)
Exercise to Lose Weight Exercise and a healthy diet may help you lose weight. Your doctor may suggest specific exercises. EXERCISE IDEAS AND TIPS  Choose low-cost things you enjoy doing, such as walking, bicycling, or exercising to workout videos.  Take stairs instead of the elevator.  Walk during your lunch break.  Park your car further away from work or school.  Go to a gym or an exercise class.  Start with 5 to 10 minutes of exercise each day. Build up to 30 minutes of exercise 4 to 6 days a week.  Wear shoes with good support and comfortable clothes.  Stretch before and after working out.  Work out until you breathe harder and your heart beats faster.  Drink extra water when you exercise.  Do not do so much that you hurt yourself, feel dizzy, or get very short of breath. Exercises that burn about 150 calories:  Running 1  miles in 15 minutes.  Playing volleyball for 45 to 60 minutes.  Washing and waxing a car for 45 to 60 minutes.  Playing touch football for 45 minutes.  Walking 1  miles in 35 minutes.  Pushing a stroller 1  miles in 30 minutes.  Playing basketball for 30 minutes.  Raking leaves for 30 minutes.  Bicycling 5 miles in 30 minutes.  Walking 2 miles in 30 minutes.  Dancing for 30 minutes.  Shoveling snow for 15 minutes.  Swimming laps for 20 minutes.  Walking up stairs for 15 minutes.  Bicycling 4 miles in 15 minutes.  Gardening for 30 to 45 minutes.  Jumping rope for 15 minutes.  Washing windows or floors for 45 to 60 minutes. Document Released: 09/21/2010 Document Revised: 11/11/2011 Document Reviewed: 09/21/2010 ExitCare Patient Information 2015 [employer]. This information is not intended to replace advice given to you by your health care provider. Make sure you discuss any questions you have with your health care provider.  

## 2014-03-21 NOTE — Progress Notes (Signed)
Subjective:    Patient ID: Kelly Lewis, female    DOB: 03-13-1951, 63 y.o.   MRN: [id number]  Patient here today for follow up of chronic medical problems.  Hypertension This is a chronic problem. The current episode started more than 1 year ago. The problem has been resolved since onset. The problem is controlled. Associated symptoms include anxiety. Pertinent negatives include no blurred vision, chest pain, headaches, palpitations, peripheral edema or shortness of breath. Agents associated with hypertension include thyroid hormones. Risk factors for coronary artery disease include obesity. Past treatments include ACE inhibitors. The current treatment provides significant improvement. Compliance problems include exercise and diet.   Gastrophageal Reflux She reports no chest pain, no coughing, no dysphagia, no heartburn, no sore throat or no wheezing. This is a chronic problem. The current episode started more than 1 year ago. The problem occurs rarely. The problem has been waxing and waning. The symptoms are aggravated by certain foods (spicy). She has tried a histamine-2 antagonist for the symptoms. The treatment provided moderate relief. Past procedures include an EGD. Past procedures do not include H. pylori antibody titer.  hyperlipidemia They wanted patient to start to medication at last check but patient refused. Has been dieting and wants to see if has improved. Hasn't been exercising very often GAD Mother is 34 years old and she is helping take care of her which makes her very anxious Hypothyroidism Levothyroxine 11mcg daily- Tolerating well- No C/O fatigue.  Review of Systems  HENT: Negative for sore throat.   Eyes: Negative for blurred vision.  Respiratory: Negative for cough, shortness of breath and wheezing.   Cardiovascular: Negative for chest pain and palpitations.  Gastrointestinal: Negative for heartburn and dysphagia.  Neurological: Negative for headaches.  All other  systems reviewed and are negative.      Objective:   Physical Exam  Constitutional: She is oriented to person, place, and time. She appears well-developed and well-nourished.  HENT:  Nose: Nose normal.  Mouth/Throat: Oropharynx is clear and moist.  Eyes: EOM are normal.  Neck: Trachea normal, normal range of motion and full passive range of motion without pain. Neck supple. No JVD present. Carotid bruit is not present. No thyromegaly present.  Cardiovascular: Normal rate, regular rhythm, normal heart sounds and intact distal pulses.  Exam reveals no gallop and no friction rub.   No murmur heard. Pulmonary/Chest: Effort normal and breath sounds normal.  Abdominal: Soft. Bowel sounds are normal. She exhibits no distension and no mass. There is no tenderness.  Musculoskeletal: Normal range of motion.  Lymphadenopathy:    She has no cervical adenopathy.  Neurological: She is alert and oriented to person, place, and time. She has normal reflexes.  Skin: Skin is warm and dry.  Psychiatric: She has a normal mood and affect. Her behavior is normal. Judgment and thought content normal.   BP 134/80  Pulse 84  Temp(Src) 98.8 F (37.1 C) (Oral)  Ht 5' 6.5" (1.689 m)  Wt 223 lb (101.152 kg)  BMI 35.46 kg/m2        Assessment & Plan:   1. Hypothyroidism, unspecified hypothyroidism type   2. Essential hypertension   3. Gastroesophageal reflux disease without esophagitis   4. GAD (generalized anxiety disorder)   5. BMI 35.0-35.9,adult   6. Weight loss counseling, encounter for    Orders Placed This Encounter  Procedures  . CMP14+EGFR  . NMR, lipoprofile  . Thyroid Panel With TSH   Meds ordered this encounter  Medications  .  DISCONTD: omeprazole (PRILOSEC OTC) 20 MG tablet    Sig: Take 20 mg by mouth daily.  Marland Kitchen omeprazole (PRILOSEC OTC) 20 MG tablet    Sig: Take 1 tablet (20 mg total) by mouth daily.    Dispense:  90 tablet    Refill:  1    Order Specific Question:   Supervising Provider    Answer:  Chipper Herb [[id number]]  . lisinopril (PRINIVIL,ZESTRIL) 5 MG tablet    Sig: TAKE 1 TABLET (5 MG TOTAL) BY MOUTH DAILY.    Dispense:  90 tablet    Refill:  1    Order Specific Question:  Supervising Provider    Answer:  Chipper Herb [[id number]]  . levothyroxine (SYNTHROID, LEVOTHROID) 100 MCG tablet    Sig: TAKE 1 TABLET (100 MCG TOTAL) BY MOUTH DAILY.    Dispense:  90 tablet    Refill:  1    Order Specific Question:  Supervising Provider    Answer:  Chipper Herb [[id number]]  . LORazepam (ATIVAN) 0.5 MG tablet    Sig: TAKE 1 TABLET TWICE A DAY    Dispense:  60 tablet    Refill:  1    Order Specific Question:  Supervising Provider    Answer:  Chipper Herb [[id number]]   Patient will make appointment for nmammogram Labs pending Health maintenance reviewed Diet and exercise encouraged Continue all meds Follow up  In 3 month   [place], FNP

## 2014-03-22 LAB — CMP14+EGFR
ALBUMIN: 4.6 g/dL (ref 3.6–4.8)
ALK PHOS: 182 IU/L — AB (ref 39–117)
ALT: 40 IU/L — ABNORMAL HIGH (ref 0–32)
AST: 46 IU/L — ABNORMAL HIGH (ref 0–40)
Albumin/Globulin Ratio: 1.7 (ref 1.1–2.5)
BILIRUBIN TOTAL: 0.6 mg/dL (ref 0.0–1.2)
BUN / CREAT RATIO: 14 (ref 11–26)
BUN: 11 mg/dL (ref 8–27)
CHLORIDE: 100 mmol/L (ref 97–108)
CO2: 20 mmol/L (ref 18–29)
CREATININE: 0.79 mg/dL (ref 0.57–1.00)
Calcium: 10.3 mg/dL (ref 8.7–10.3)
GFR, EST AFRICAN AMERICAN: 92 mL/min/{1.73_m2} (ref >59)
GFR, EST NON AFRICAN AMERICAN: 80 mL/min/{1.73_m2} (ref >59)
GLOBULIN, TOTAL: 2.7 g/dL (ref 1.5–4.5)
GLUCOSE: 86 mg/dL (ref 65–99)
Potassium: 4.8 mmol/L (ref 3.5–5.2)
Sodium: 137 mmol/L (ref 134–144)
TOTAL PROTEIN: 7.3 g/dL (ref 6.0–8.5)

## 2014-03-22 LAB — NMR, LIPOPROFILE
CHOLESTEROL: 204 mg/dL — AB (ref 100–199)
HDL Cholesterol by NMR: 49 mg/dL (ref >39)
HDL PARTICLE NUMBER: 26.3 umol/L — AB (ref >=30.5)
LDL PARTICLE NUMBER: 1491 nmol/L — AB (ref <1000)
LDL SIZE: 21.3 nm (ref >20.5)
LDLC SERPL CALC-MCNC: 133 mg/dL — ABNORMAL HIGH (ref 0–99)
LP-IR SCORE: 37 (ref <=45)
Small LDL Particle Number: 613 nmol/L — ABNORMAL HIGH (ref <=527)
TRIGLYCERIDES BY NMR: 108 mg/dL (ref 0–149)

## 2014-03-22 LAB — THYROID PANEL WITH TSH
Free Thyroxine Index: 3.4 (ref 1.2–4.9)
T3 Uptake Ratio: 22 % — ABNORMAL LOW (ref 24–39)
T4, Total: 15.4 ug/dL — ABNORMAL HIGH (ref 4.5–12.0)
TSH: 0.848 u[IU]/mL (ref 0.450–4.500)

## 2014-07-04 ENCOUNTER — Other Ambulatory Visit: Payer: Self-pay | Admitting: Nurse Practitioner

## 2014-07-05 NOTE — Telephone Encounter (Signed)
rx called into pharmacy for lorazepam

## 2014-07-05 NOTE — Telephone Encounter (Signed)
Please call in ativan with 1 refills 

## 2014-07-05 NOTE — Telephone Encounter (Signed)
Patient last seen in office on 03-21-14. Rx last filled on 05-20-14 for #60. Please advise

## 2014-09-13 ENCOUNTER — Other Ambulatory Visit: Payer: Self-pay | Admitting: Nurse Practitioner

## 2014-09-17 ENCOUNTER — Other Ambulatory Visit: Payer: Self-pay | Admitting: Nurse Practitioner

## 2014-09-20 ENCOUNTER — Other Ambulatory Visit: Payer: Self-pay | Admitting: Nurse Practitioner

## 2014-09-27 ENCOUNTER — Other Ambulatory Visit: Payer: Self-pay | Admitting: Nurse Practitioner

## 2014-09-28 NOTE — Telephone Encounter (Signed)
Last seen 03/21/14 MMM No upcoming appt

## 2014-09-28 NOTE — Telephone Encounter (Signed)
Please call in ativan with 1 refills 

## 2014-09-28 NOTE — Telephone Encounter (Signed)
rx called into pharmacy

## 2014-10-23 ENCOUNTER — Other Ambulatory Visit: Payer: Self-pay | Admitting: Nurse Practitioner

## 2014-11-29 ENCOUNTER — Other Ambulatory Visit: Payer: Self-pay | Admitting: Nurse Practitioner

## 2014-12-26 ENCOUNTER — Ambulatory Visit (INDEPENDENT_AMBULATORY_CARE_PROVIDER_SITE_OTHER): Payer: 59 | Admitting: Nurse Practitioner

## 2014-12-26 ENCOUNTER — Encounter: Payer: Self-pay | Admitting: Nurse Practitioner

## 2014-12-26 VITALS — BP 122/82 | HR 85 | Temp 97.2°F

## 2014-12-26 DIAGNOSIS — I10 Essential (primary) hypertension: Secondary | ICD-10-CM | POA: Diagnosis not present

## 2014-12-26 DIAGNOSIS — E039 Hypothyroidism, unspecified: Secondary | ICD-10-CM | POA: Diagnosis not present

## 2014-12-26 DIAGNOSIS — F411 Generalized anxiety disorder: Secondary | ICD-10-CM | POA: Diagnosis not present

## 2014-12-26 DIAGNOSIS — K219 Gastro-esophageal reflux disease without esophagitis: Secondary | ICD-10-CM | POA: Diagnosis not present

## 2014-12-26 MED ORDER — LEVOTHYROXINE SODIUM 100 MCG PO TABS: ORAL_TABLET | ORAL | Status: DC

## 2014-12-26 MED ORDER — OMEPRAZOLE 20 MG PO CPDR: DELAYED_RELEASE_CAPSULE | ORAL | Status: DC

## 2014-12-26 MED ORDER — LISINOPRIL 5 MG PO TABS: ORAL_TABLET | ORAL | Status: DC

## 2014-12-26 MED ORDER — LORAZEPAM 0.5 MG PO TABS
0.5000 mg | ORAL_TABLET | Freq: Two times a day (BID) | ORAL | Status: DC
Start: 2014-12-26 — End: 2015-03-28

## 2014-12-26 NOTE — Progress Notes (Signed)
Subjective:    Patient ID: Kelly Lewis, female    DOB: Jan 04, 1951, 64 y.o.   MRN: [id number]  Patient here today for follow up of chronic medical problems.  Hypertension This is a chronic problem. The current episode started more than 1 year ago. The problem is unchanged. The problem is controlled (251-898 systolic). Pertinent negatives include no chest pain, headaches, palpitations or shortness of breath. Risk factors for coronary artery disease include dyslipidemia, post-menopausal state and sedentary lifestyle. Past treatments include ACE inhibitors. The current treatment provides moderate improvement. Compliance problems include diet and exercise.   Gastrophageal Reflux She reports no chest pain, no coughing, no sore throat or no wheezing.  hyperlipidemia They wanted patient to start to medication at last check but patient refused. Has been dieting and wants to see if has improved. Hasn't been exercising very often GAD Mother is 65 years old and she is helping take care of her which makes her very anxious Hypothyroidism Levothyroxine 42mcg daily- Tolerating well- No C/O fatigue.  Review of Systems  Constitutional: Negative.   HENT: Negative.  Negative for sore throat.   Respiratory: Negative for cough, shortness of breath and wheezing.   Cardiovascular: Negative for chest pain and palpitations.  Genitourinary: Negative.   Neurological: Negative for headaches.  Hematological: Bruises/bleeds easily.  Psychiatric/Behavioral: Negative.   All other systems reviewed and are negative.      Objective:   Physical Exam  Constitutional: She is oriented to person, place, and time. She appears well-developed and well-nourished.  HENT:  Nose: Nose normal.  Mouth/Throat: Oropharynx is clear and moist.  Eyes: EOM are normal.  Neck: Trachea normal, normal range of motion and full passive range of motion without pain. Neck supple. No JVD present. Carotid bruit is not present. No thyromegaly  present.  Cardiovascular: Normal rate, regular rhythm, normal heart sounds and intact distal pulses.  Exam reveals no gallop and no friction rub.   No murmur heard. Pulmonary/Chest: Effort normal and breath sounds normal.  Abdominal: Soft. Bowel sounds are normal. She exhibits no distension and no mass. There is no tenderness.  Musculoskeletal: Normal range of motion.  Lymphadenopathy:    She has no cervical adenopathy.  Neurological: She is alert and oriented to person, place, and time. She has normal reflexes.  Skin: Skin is warm and dry.  Psychiatric: She has a normal mood and affect. Her behavior is normal. Judgment and thought content normal.    BP 122/82 mmHg  Pulse 85  Temp(Src) 97.2 F (36.2 C) (Oral)        Assessment & Plan:   1. Essential hypertension Do not add salt to diet - lisinopril (PRINIVIL,ZESTRIL) 5 MG tablet; TAKE 1 TABLET (5 MG TOTAL) BY MOUTH DAILY.  Dispense: 90 tablet; Refill: 0 - CMP14+EGFR - NMR, lipoprofile  2. Gastroesophageal reflux disease without esophagitis Avoid spicy foods Do not eat 2 hours prior to bedtime - omeprazole (PRILOSEC) 20 MG capsule; TAKE 1 TABLET (20 MG TOTAL) BY MOUTH DAILY.  Dispense: 30 capsule; Refill: 5  3. Hypothyroidism, unspecified hypothyroidism type - levothyroxine (SYNTHROID, LEVOTHROID) 100 MCG tablet; TAKE 1 TABLET (100 MCG TOTAL) BY MOUTH DAILY.  Dispense: 90 tablet; Refill: 1 - Thyroid Panel With TSH  4. GAD (generalized anxiety disorder) Stress management - LORazepam (ATIVAN) 0.5 MG tablet; Take 1 tablet (0.5 mg total) by mouth 2 (two) times daily.  Dispense: 60 tablet; Refill: 1   Will schedule pap for July- will do dexa scan that day Labs pending  Health maintenance reviewed Diet and exercise encouraged Continue all meds Follow up  In 6 months   [place], FNP

## 2014-12-26 NOTE — Patient Instructions (Signed)
Exercise to Stay Healthy Exercise helps you become and stay healthy. EXERCISE IDEAS AND TIPS Choose exercises that:  You enjoy.  Fit into your day. You do not need to exercise really hard to be healthy. You can do exercises at a slow or medium level and stay healthy. You can:  Stretch before and after working out.  Try yoga, Pilates, or tai chi.  Lift weights.  Walk fast, swim, jog, run, climb stairs, bicycle, dance, or rollerskate.  Take aerobic classes. Exercises that burn about 150 calories:  Running 1  miles in 15 minutes.  Playing volleyball for 45 to 60 minutes.  Washing and waxing a car for 45 to 60 minutes.  Playing touch football for 45 minutes.  Walking 1  miles in 35 minutes.  Pushing a stroller 1  miles in 30 minutes.  Playing basketball for 30 minutes.  Raking leaves for 30 minutes.  Bicycling 5 miles in 30 minutes.  Walking 2 miles in 30 minutes.  Dancing for 30 minutes.  Shoveling snow for 15 minutes.  Swimming laps for 20 minutes.  Walking up stairs for 15 minutes.  Bicycling 4 miles in 15 minutes.  Gardening for 30 to 45 minutes.  Jumping rope for 15 minutes.  Washing windows or floors for 45 to 60 minutes. Document Released: 09/21/2010 Document Revised: 11/11/2011 Document Reviewed: 09/21/2010 ExitCare Patient Information 2015 [employer]. This information is not intended to replace advice given to you by your health care provider. Make sure you discuss any questions you have with your health care provider.  

## 2014-12-27 LAB — THYROID PANEL WITH TSH
FREE THYROXINE INDEX: 3.1 (ref 1.2–4.9)
T3 Uptake Ratio: 22 % — ABNORMAL LOW (ref 24–39)
T4, Total: 14.1 ug/dL — ABNORMAL HIGH (ref 4.5–12.0)
TSH: 0.923 u[IU]/mL (ref 0.450–4.500)

## 2014-12-27 LAB — NMR, LIPOPROFILE
Cholesterol: 213 mg/dL — ABNORMAL HIGH (ref 100–199)
HDL Cholesterol by NMR: 49 mg/dL (ref >39)
HDL Particle Number: 25.4 umol/L — ABNORMAL LOW (ref >=30.5)
LDL Particle Number: 1766 nmol/L — ABNORMAL HIGH (ref <1000)
LDL Size: 21.6 nm (ref >20.5)
LDL-C: 144 mg/dL — ABNORMAL HIGH (ref 0–99)
LP-IR SCORE: 43 (ref <=45)
SMALL LDL PARTICLE NUMBER: 521 nmol/L (ref <=527)
Triglycerides by NMR: 99 mg/dL (ref 0–149)

## 2014-12-27 LAB — CMP14+EGFR
ALT: 31 IU/L (ref 0–32)
AST: 43 IU/L — AB (ref 0–40)
Albumin/Globulin Ratio: 1.5 (ref 1.1–2.5)
Albumin: 4.3 g/dL (ref 3.6–4.8)
Alkaline Phosphatase: 161 IU/L — ABNORMAL HIGH (ref 39–117)
BUN/Creatinine Ratio: 13 (ref 11–26)
BUN: 12 mg/dL (ref 8–27)
Bilirubin Total: 0.5 mg/dL (ref 0.0–1.2)
CHLORIDE: 103 mmol/L (ref 97–108)
CO2: 23 mmol/L (ref 18–29)
Calcium: 10.2 mg/dL (ref 8.7–10.3)
Creatinine, Ser: 0.89 mg/dL (ref 0.57–1.00)
GFR, EST AFRICAN AMERICAN: 79 mL/min/{1.73_m2} (ref >59)
GFR, EST NON AFRICAN AMERICAN: 69 mL/min/{1.73_m2} (ref >59)
Globulin, Total: 2.8 g/dL (ref 1.5–4.5)
Glucose: 93 mg/dL (ref 65–99)
POTASSIUM: 4.8 mmol/L (ref 3.5–5.2)
SODIUM: 141 mmol/L (ref 134–144)
TOTAL PROTEIN: 7.1 g/dL (ref 6.0–8.5)

## 2015-03-28 ENCOUNTER — Ambulatory Visit (INDEPENDENT_AMBULATORY_CARE_PROVIDER_SITE_OTHER): Payer: Commercial Managed Care - HMO

## 2015-03-28 ENCOUNTER — Ambulatory Visit (INDEPENDENT_AMBULATORY_CARE_PROVIDER_SITE_OTHER): Payer: Commercial Managed Care - HMO | Admitting: Nurse Practitioner

## 2015-03-28 ENCOUNTER — Encounter: Payer: Self-pay | Admitting: Nurse Practitioner

## 2015-03-28 VITALS — BP 126/80 | HR 79 | Temp 97.6°F | Ht 66.0 in | Wt 223.8 lb

## 2015-03-28 DIAGNOSIS — F411 Generalized anxiety disorder: Secondary | ICD-10-CM | POA: Diagnosis not present

## 2015-03-28 DIAGNOSIS — K219 Gastro-esophageal reflux disease without esophagitis: Secondary | ICD-10-CM | POA: Diagnosis not present

## 2015-03-28 DIAGNOSIS — I10 Essential (primary) hypertension: Secondary | ICD-10-CM | POA: Diagnosis not present

## 2015-03-28 DIAGNOSIS — Z6836 Body mass index (BMI) 36.0-36.9, adult: Secondary | ICD-10-CM | POA: Diagnosis not present

## 2015-03-28 DIAGNOSIS — E039 Hypothyroidism, unspecified: Secondary | ICD-10-CM | POA: Diagnosis not present

## 2015-03-28 DIAGNOSIS — Z0000 Encounter for general adult medical examination without abnormal findings: Secondary | ICD-10-CM | POA: Diagnosis not present

## 2015-03-28 DIAGNOSIS — Z01419 Encounter for gynecological examination (general) (routine) without abnormal findings: Secondary | ICD-10-CM | POA: Diagnosis not present

## 2015-03-28 DIAGNOSIS — Z6834 Body mass index (BMI) 34.0-34.9, adult: Secondary | ICD-10-CM | POA: Insufficient documentation

## 2015-03-28 LAB — POCT CBC
Granulocyte percent: 66.5 %G (ref 37–80)
HCT, POC: 39.7 % (ref 37.7–47.9)
Hemoglobin: 13.1 g/dL (ref 12.2–16.2)
LYMPH, POC: 1.6 (ref 0.6–3.4)
MCH, POC: 28.5 pg (ref 27–31.2)
MCHC: 32.9 g/dL (ref 31.8–35.4)
MCV: 86.5 fL (ref 80–97)
MPV: 9.8 fL (ref 0–99.8)
POC GRANULOCYTE: 3.9 (ref 2–6.9)
POC LYMPH PERCENT: 27.7 %L (ref 10–50)
Platelet Count, POC: 132 10*3/uL — AB (ref 142–424)
RBC: 4.59 M/uL (ref 4.04–5.48)
RDW, POC: 13.4 %
WBC: 5.9 10*3/uL (ref 4.6–10.2)

## 2015-03-28 LAB — POCT UA - MICROSCOPIC ONLY
Casts, Ur, LPF, POC: NEGATIVE
Crystals, Ur, HPF, POC: NEGATIVE
MUCUS UA: NEGATIVE
RBC, urine, microscopic: NEGATIVE
WBC, UR, HPF, POC: NEGATIVE
YEAST UA: NEGATIVE

## 2015-03-28 LAB — POCT URINALYSIS DIPSTICK
BILIRUBIN UA: NEGATIVE
Glucose, UA: NEGATIVE
Ketones, UA: NEGATIVE
LEUKOCYTES UA: NEGATIVE
Nitrite, UA: POSITIVE
PROTEIN UA: NEGATIVE
Spec Grav, UA: 1.01
UROBILINOGEN UA: NEGATIVE
pH, UA: 7

## 2015-03-28 MED ORDER — LISINOPRIL 5 MG PO TABS: ORAL_TABLET | ORAL | Status: DC

## 2015-03-28 MED ORDER — LEVOTHYROXINE SODIUM 100 MCG PO TABS: ORAL_TABLET | ORAL | Status: DC

## 2015-03-28 MED ORDER — LORAZEPAM 0.5 MG PO TABS: 0.5000 mg | ORAL_TABLET | Freq: Two times a day (BID) | ORAL | Status: DC

## 2015-03-28 MED ORDER — OMEPRAZOLE 20 MG PO CPDR: DELAYED_RELEASE_CAPSULE | ORAL | Status: DC

## 2015-03-28 NOTE — Patient Instructions (Signed)
Bone Health Our bones do many things. They provide structure, protect organs, anchor muscles, and store calcium. Adequate calcium in your diet and weight-bearing physical activity help build strong bones, improve bone amounts, and may reduce the risk of weakening of bones (osteoporosis) later in life. PEAK BONE MASS By age 64, the average woman has acquired most of her skeletal bone mass. A large decline occurs in older adults which increases the risk of osteoporosis. In women this occurs around the time of menopause. It is important for young girls to reach their peak bone mass in order to maintain bone health throughout life. A person with high bone mass as a young adult will be more likely to have a higher bone mass later in life. Not enough calcium consumption and physical activity early on could result in a failure to achieve optimum bone mass in adulthood. OSTEOPOROSIS Osteoporosis is a disease of the bones. It is defined as low bone mass with deterioration of bone structure. Osteoporosis leads to an increase risk of fractures with falls. These fractures commonly happen in the wrist, hip, and spine. While men and women of all ages and background can develop osteoporosis, some of the risk factors for osteoporosis are:  Female.  White.  Postmenopausal.  Older adults.  Small in body size.  Eating a diet low in calcium.  Physically inactive.  Smoking.  Use of some medications.  Family history. CALCIUM Calcium is a mineral needed by the body for healthy bones, teeth, and proper function of the heart, muscles, and nerves. The body cannot produce calcium so it must be absorbed through food. Good sources of calcium include:  Dairy products (low fat or nonfat milk, cheese, and yogurt).  Dark green leafy vegetables (bok choy and broccoli).  Calcium fortified foods (orange juice, cereal, bread, soy beverages, and tofu products).  Nuts (almonds). Recommended amounts of calcium vary  for individuals. RECOMMENDED CALCIUM INTAKES Age and Amount in mg per day  Children 1 to 3 years / 700 mg  Children 4 to 8 years / 1,000 mg  Children 9 to 13 years / 1,300 mg  Teens 14 to 18 years / 1,300 mg  Adults 19 to 50 years / 1,000 mg  Adult women 51 to 70 years / 1,200 mg  Adults 71 years and older / 1,200 mg  Pregnant and breastfeeding teens / 1,300 mg  Pregnant and breastfeeding adults / 1,000 mg Vitamin D also plays an important role in healthy bone development. Vitamin D helps in the absorption of calcium. WEIGHT-BEARING PHYSICAL ACTIVITY Regular physical activity has many positive health benefits. Benefits include strong bones. Weight-bearing physical activity early in life is important in reaching peak bone mass. Weight-bearing physical activities cause muscles and bones to work against gravity. Some examples of weight bearing physical activities include:  Walking, jogging, or running.  Field Hockey.  Jumping rope.  Dancing.  Soccer.  Tennis or Racquetball.  Stair climbing.  Basketball.  Hiking.  Weight lifting.  Aerobic fitness classes. Including weight-bearing physical activity into an exercise plan is a great way to keep bones healthy. Adults: Engage in at least 30 minutes of moderate physical activity on most, preferably all, days of the week. Children: Engage in at least 60 minutes of moderate physical activity on most, preferably all, days of the week. FOR MORE INFORMATION [place] Department of Agriculture, Center for Nutrition Policy and Promotion: www.cnpp.usda.gov National Osteoporosis Foundation: www.nof.org Document Released: 11/09/2003 Document Revised: 12/14/2012 Document Reviewed: 02/08/2009 ExitCare Patient Information   2015 [employer]. This information is not intended to replace advice given to you by your health care provider. Make sure you discuss any questions you have with your health care provider.  

## 2015-03-28 NOTE — Progress Notes (Signed)
Subjective:    Patient ID: Kelly Lewis, female    DOB: 10-Apr-1951, 64 y.o.   MRN: [id number]  Patient here today for follow up of chronic medical problems.  Hypertension This is a chronic problem. The current episode started more than 1 year ago. The problem is unchanged. The problem is controlled (326-712 systolic). Pertinent negatives include no chest pain, headaches, palpitations or shortness of breath. Risk factors for coronary artery disease include dyslipidemia, post-menopausal state and sedentary lifestyle. Past treatments include ACE inhibitors. The current treatment provides moderate improvement. Compliance problems include diet and exercise.   Gastrophageal Reflux She reports no chest pain, no coughing, no sore throat or no wheezing.  hyperlipidemia They wanted patient to start to medication at last check but patient refused. Has been dieting and wants to see if has improved. Hasn't been exercising very often GAD Mother is 42 years old and she is helping take care of her which makes her very anxious Hypothyroidism Levothyroxine 85mcg daily- Tolerating well- No C/O fatigue.  Review of Systems  Constitutional: Negative.   HENT: Negative.  Negative for sore throat.   Respiratory: Negative for cough, shortness of breath and wheezing.   Cardiovascular: Negative for chest pain and palpitations.  Genitourinary: Negative.   Neurological: Negative for headaches.  Hematological: Bruises/bleeds easily.  Psychiatric/Behavioral: Negative.   All other systems reviewed and are negative.      Objective:   Physical Exam  Constitutional: She is oriented to person, place, and time. She appears well-developed and well-nourished.  HENT:  Head: Normocephalic.  Right Ear: Hearing, tympanic membrane, external ear and ear canal normal.  Left Ear: Hearing, tympanic membrane, external ear and ear canal normal.  Nose: Nose normal.  Mouth/Throat: Uvula is midline and oropharynx is clear and  moist.  Eyes: Conjunctivae and EOM are normal. Pupils are equal, round, and reactive to light.  Neck: Trachea normal, normal range of motion and full passive range of motion without pain. Neck supple. No JVD present. Carotid bruit is not present. No thyroid mass and no thyromegaly present.  Cardiovascular: Normal rate, regular rhythm, normal heart sounds and intact distal pulses.  Exam reveals no gallop and no friction rub.   No murmur heard. Pulmonary/Chest: Effort normal and breath sounds normal. Right breast exhibits no inverted nipple, no mass, no nipple discharge, no skin change and no tenderness. Left breast exhibits no inverted nipple, no mass, no nipple discharge, no skin change and no tenderness.  Abdominal: Soft. Bowel sounds are normal. She exhibits no distension and no mass. There is no tenderness.  Genitourinary: Vagina normal and uterus normal. No breast swelling, tenderness, discharge or bleeding.  bimanual exam-No adnexal masses or tenderness. Large cystocele with slight uterine prolapse Cervix parous and pink no vaginal discharge  Musculoskeletal: Normal range of motion.  Lymphadenopathy:    She has no cervical adenopathy.  Neurological: She is alert and oriented to person, place, and time. She has normal reflexes.  Skin: Skin is warm and dry.  Psychiatric: She has a normal mood and affect. Her behavior is normal. Judgment and thought content normal.    BP 126/80 mmHg  Pulse 79  Temp(Src) 97.6 F (36.4 C) (Oral)  Ht 5\' 6"  (1.676 m)  Wt 223 lb 12.8 oz (101.515 kg)  BMI 36.14 kg/m2  EKG- NSR-Mary-Margaret Hassell Done, FNP   Chest x ray   - normal cardiopulmonary findings-Preliminary reading by Ronnald Collum, FNP  [facility]  Assessment & Plan:   1. Annual physical exam - POCT urinalysis dipstick - POCT UA - Microscopic Only - POCT CBC  2. Encounter for routine gynecological examination - Pap IG (Image  Guided)  3. Essential hypertension Do not add salt o diet - lisinopril (PRINIVIL,ZESTRIL) 5 MG tablet; TAKE 1 TABLET (5 MG TOTAL) BY MOUTH DAILY.  Dispense: 90 tablet; Refill: 0 - DG Chest 2 View; Future - Pap IG w/ reflex to HPV when ASC-U  4. Gastroesophageal reflux disease without esophagitis Avoid spicy foods Do not eat 2 hours prior to bedtime - omeprazole (PRILOSEC) 20 MG capsule; TAKE 1 TABLET (20 MG TOTAL) BY MOUTH DAILY.  Dispense: 30 capsule; Refill: 5  5. Hypothyroidism, unspecified hypothyroidism type - levothyroxine (SYNTHROID, LEVOTHROID) 100 MCG tablet; TAKE 1 TABLET (100 MCG TOTAL) BY MOUTH DAILY.  Dispense: 90 tablet; Refill: 1 - Thyroid Panel With TSH  6. GAD (generalized anxiety disorder) Stress management - LORazepam (ATIVAN) 0.5 MG tablet; Take 1 tablet (0.5 mg total) by mouth 2 (two) times daily.  Dispense: 60 tablet; Refill: 1  7. BMI 36.0-36.9,adult Discussed diet and exercise for person with BMI >25 Will recheck weight in 3-6 months     Labs pending Health maintenance reviewed Diet and exercise encouraged Continue all meds Follow up  In 6 months   Story, FNP

## 2015-03-29 LAB — THYROID PANEL WITH TSH
FREE THYROXINE INDEX: 3.4 (ref 1.2–4.9)
T3 Uptake Ratio: 23 % — ABNORMAL LOW (ref 24–39)
T4 TOTAL: 14.9 ug/dL — AB (ref 4.5–12.0)
TSH: 0.979 u[IU]/mL (ref 0.450–4.500)

## 2015-03-30 ENCOUNTER — Telehealth: Payer: Self-pay | Admitting: Nurse Practitioner

## 2015-03-30 DIAGNOSIS — D696 Thrombocytopenia, unspecified: Secondary | ICD-10-CM

## 2015-03-30 LAB — PAP IG W/ RFLX HPV ASCU: PAP SMEAR COMMENT: 0

## 2015-03-30 NOTE — Telephone Encounter (Signed)
Pt aware of notes

## 2015-03-31 ENCOUNTER — Other Ambulatory Visit: Payer: Self-pay | Admitting: Nurse Practitioner

## 2015-04-03 ENCOUNTER — Other Ambulatory Visit (INDEPENDENT_AMBULATORY_CARE_PROVIDER_SITE_OTHER): Payer: Commercial Managed Care - HMO

## 2015-04-03 DIAGNOSIS — D696 Thrombocytopenia, unspecified: Secondary | ICD-10-CM

## 2015-04-03 LAB — POCT CBC
Granulocyte percent: 60.4 %G (ref 37–80)
HCT, POC: 39.1 % (ref 37.7–47.9)
HEMOGLOBIN: 12.6 g/dL (ref 12.2–16.2)
LYMPH, POC: 1.6 (ref 0.6–3.4)
MCH, POC: 27.9 pg (ref 27–31.2)
MCHC: 32.3 g/dL (ref 31.8–35.4)
MCV: 86.5 fL (ref 80–97)
MPV: 9.4 fL (ref 0–99.8)
PLATELET COUNT, POC: 115 10*3/uL — AB (ref 142–424)
POC Granulocyte: 2.8 (ref 2–6.9)
POC LYMPH PERCENT: 34.1 %L (ref 10–50)
RBC: 4.52 M/uL (ref 4.04–5.48)
RDW, POC: 13 %
WBC: 4.7 10*3/uL (ref 4.6–10.2)

## 2015-04-03 NOTE — Progress Notes (Signed)
Lab only 

## 2015-04-05 ENCOUNTER — Encounter: Payer: Self-pay | Admitting: Gastroenterology

## 2015-04-07 ENCOUNTER — Encounter: Payer: Self-pay | Admitting: Gastroenterology

## 2015-04-21 ENCOUNTER — Encounter: Payer: Self-pay | Admitting: Nurse Practitioner

## 2015-06-21 ENCOUNTER — Other Ambulatory Visit: Payer: Self-pay | Admitting: Nurse Practitioner

## 2015-06-21 NOTE — Telephone Encounter (Signed)
rx called into pharmacy

## 2015-06-21 NOTE — Telephone Encounter (Signed)
Last seen and last thyroid 03/28/15  MMM  If approved route to nurse to call into [facility]

## 2015-06-21 NOTE — Telephone Encounter (Signed)
Please call in lorazepam with 1 refills 

## 2015-06-24 ENCOUNTER — Other Ambulatory Visit: Payer: Self-pay | Admitting: Nurse Practitioner

## 2015-08-03 LAB — HM MAMMOGRAPHY: HM MAMMO: UNDETERMINED

## 2015-08-07 ENCOUNTER — Encounter: Payer: Self-pay | Admitting: *Deleted

## 2015-09-05 ENCOUNTER — Other Ambulatory Visit: Payer: Self-pay | Admitting: Nurse Practitioner

## 2015-09-05 NOTE — Telephone Encounter (Signed)
Please call in ativan with 1 refills 

## 2015-09-05 NOTE — Telephone Encounter (Signed)
rx called into pharmacy

## 2015-09-05 NOTE — Telephone Encounter (Signed)
Last filled 08/04/15, last seen 03/28/15. Call in at

## 2015-09-23 ENCOUNTER — Other Ambulatory Visit: Payer: Self-pay | Admitting: Nurse Practitioner

## 2015-11-20 ENCOUNTER — Ambulatory Visit (INDEPENDENT_AMBULATORY_CARE_PROVIDER_SITE_OTHER): Payer: Commercial Managed Care - HMO

## 2015-11-20 ENCOUNTER — Ambulatory Visit (INDEPENDENT_AMBULATORY_CARE_PROVIDER_SITE_OTHER): Payer: Commercial Managed Care - HMO | Admitting: Nurse Practitioner

## 2015-11-20 ENCOUNTER — Encounter: Payer: Self-pay | Admitting: Nurse Practitioner

## 2015-11-20 VITALS — BP 121/79 | HR 89 | Temp 97.1°F | Ht 66.0 in | Wt 223.0 lb

## 2015-11-20 DIAGNOSIS — E039 Hypothyroidism, unspecified: Secondary | ICD-10-CM | POA: Diagnosis not present

## 2015-11-20 DIAGNOSIS — Z23 Encounter for immunization: Secondary | ICD-10-CM

## 2015-11-20 DIAGNOSIS — S39012A Strain of muscle, fascia and tendon of lower back, initial encounter: Secondary | ICD-10-CM | POA: Diagnosis not present

## 2015-11-20 DIAGNOSIS — Z1159 Encounter for screening for other viral diseases: Secondary | ICD-10-CM

## 2015-11-20 DIAGNOSIS — Z780 Asymptomatic menopausal state: Secondary | ICD-10-CM | POA: Diagnosis not present

## 2015-11-20 DIAGNOSIS — N3000 Acute cystitis without hematuria: Secondary | ICD-10-CM | POA: Diagnosis not present

## 2015-11-20 DIAGNOSIS — F411 Generalized anxiety disorder: Secondary | ICD-10-CM | POA: Diagnosis not present

## 2015-11-20 DIAGNOSIS — K219 Gastro-esophageal reflux disease without esophagitis: Secondary | ICD-10-CM

## 2015-11-20 DIAGNOSIS — Z6834 Body mass index (BMI) 34.0-34.9, adult: Secondary | ICD-10-CM | POA: Diagnosis not present

## 2015-11-20 DIAGNOSIS — R300 Dysuria: Secondary | ICD-10-CM

## 2015-11-20 DIAGNOSIS — I10 Essential (primary) hypertension: Secondary | ICD-10-CM

## 2015-11-20 LAB — URINALYSIS, COMPLETE
BILIRUBIN UA: NEGATIVE
GLUCOSE, UA: NEGATIVE
KETONES UA: NEGATIVE
Nitrite, UA: POSITIVE — AB
PROTEIN UA: NEGATIVE
SPEC GRAV UA: 1.015 (ref 1.005–1.030)
UUROB: 0.2 mg/dL (ref 0.2–1.0)
pH, UA: 5.5 (ref 5.0–7.5)

## 2015-11-20 LAB — MICROSCOPIC EXAMINATION
RBC, UA: NONE SEEN /hpf (ref 0–?)
RENAL EPITHEL UA: NONE SEEN /HPF

## 2015-11-20 MED ORDER — LISINOPRIL 5 MG PO TABS: ORAL_TABLET | ORAL | Status: DC

## 2015-11-20 MED ORDER — OMEPRAZOLE 20 MG PO CPDR: DELAYED_RELEASE_CAPSULE | ORAL | Status: DC

## 2015-11-20 MED ORDER — LEVOTHYROXINE SODIUM 100 MCG PO TABS
ORAL_TABLET | ORAL | Status: DC
Start: 2015-11-20 — End: 2016-03-08

## 2015-11-20 MED ORDER — CIPROFLOXACIN HCL 500 MG PO TABS: 500.0000 mg | ORAL_TABLET | Freq: Two times a day (BID) | ORAL | Status: DC

## 2015-11-20 MED ORDER — CYCLOBENZAPRINE HCL 10 MG PO TABS: 10.0000 mg | ORAL_TABLET | Freq: Three times a day (TID) | ORAL | Status: DC | PRN

## 2015-11-20 NOTE — Addendum Note (Signed)
Addended by: Chevis Pretty on: 11/20/2015 10:02 AM   Modules accepted: Orders

## 2015-11-20 NOTE — Progress Notes (Addendum)
Subjective:    Patient ID: Kelly Lewis, female    DOB: 02-Feb-1951, 65 y.o.   MRN: [id number]  Patient here today for follow up of chronic medical problems. Has had lower back pain for years but has had recent flare up due to having to clean deceased mothers home and doing more than usual.   Current Outpatient Prescriptions on File Prior to Visit  Medication Sig Dispense Refill  . Cholecalciferol (VITAMIN D3) 2000 UNITS TABS Take by mouth.    . levothyroxine (SYNTHROID, LEVOTHROID) 100 MCG tablet TAKE 1 TABLET (100 MCG TOTAL) BY MOUTH DAILY. 90 tablet 2  . lisinopril (PRINIVIL,ZESTRIL) 5 MG tablet TAKE 1 TABLET (5 MG TOTAL) BY MOUTH DAILY. 90 tablet 1  . LORazepam (ATIVAN) 0.5 MG tablet TAKE 1 TABLET TWICE A DAY AS NEEDED 60 tablet 0  . omeprazole (PRILOSEC) 20 MG capsule TAKE 1 TABLET (20 MG TOTAL) BY MOUTH DAILY. 30 capsule 5   No current facility-administered medications on file prior to visit.    Hypertension This is a chronic problem. The current episode started more than 1 year ago. The problem is unchanged. The problem is controlled (518-841 systolic). Pertinent negatives include no chest pain, headaches, palpitations or shortness of breath. Risk factors for coronary artery disease include dyslipidemia, post-menopausal state and sedentary lifestyle. Past treatments include ACE inhibitors. The current treatment provides moderate improvement. Compliance problems include diet and exercise.   Gastroesophageal Reflux She reports no chest pain, no coughing, no sore throat or no wheezing. This is a chronic problem. The current episode started more than 1 year ago. The problem has been gradually worsening. The symptoms are aggravated by bending and stress. She has tried a PPI for the symptoms.  hyperlipidemia They wanted patient to start to medication at last check but patient refused. Has been dieting and wants to see if has improved. Hasn't been exercising very often GAD Mother is 29 years  old and she is helping take care of her which makes her very anxious Hypothyroidism Levothyroxine 48mg daily- Tolerating well- No C/O fatigue.  Review of Systems  Constitutional: Negative.   HENT: Negative.  Negative for sore throat.        Eye exam January 2017  Eyes: Negative.   Respiratory: Negative.  Negative for cough, shortness of breath and wheezing.   Cardiovascular: Negative.  Negative for chest pain and palpitations.  Gastrointestinal: Negative.   Endocrine: Negative.   Genitourinary: Negative.   Musculoskeletal: Positive for back pain.       Lower back pain with severe spasms on the right side if she does too much  Skin: Negative.   Allergic/Immunologic: Negative.   Neurological: Negative.  Negative for headaches.  Hematological: Negative.   Psychiatric/Behavioral: Negative.   All other systems reviewed and are negative.      Objective:   Physical Exam  Constitutional: She is oriented to person, place, and time. She appears well-developed and well-nourished.  HENT:  Nose: Nose normal.  Mouth/Throat: Oropharynx is clear and moist.  Eyes: EOM are normal.  Neck: Trachea normal, normal range of motion and full passive range of motion without pain. Neck supple. No JVD present. Carotid bruit is not present. No thyromegaly present.  Cardiovascular: Normal rate, regular rhythm, normal heart sounds and intact distal pulses.  Exam reveals no gallop and no friction rub.   No murmur heard. Pulmonary/Chest: Effort normal and breath sounds normal.  Abdominal: Soft. Bowel sounds are normal. She exhibits no distension and no mass. There  is no tenderness.  Musculoskeletal: Normal range of motion.  Lymphadenopathy:    She has no cervical adenopathy.  Neurological: She is alert and oriented to person, place, and time. She has normal reflexes.  Skin: Skin is warm and dry.  Psychiatric: She has a normal mood and affect. Her behavior is normal. Judgment and thought content normal.    Needs Prevnar vaccine today  BP 121/79 mmHg  Pulse 89  Temp(Src) 97.1 F (36.2 C) (Oral)  Ht '5\' 6"'$  (1.676 m)  Wt 223 lb (101.152 kg)  BMI 36.01 kg/m2    Assessment & Plan:    1. Dysuria Increase water intake - Urinalysis, Complete  2. Acute cystitis without hematuria Take medication as prescribe Cotton underwear Take shower not bath Cranberry juice, yogurt Force fluids AZO over the counter X2 days Culture pending RTO prn - ciprofloxacin (CIPRO) 500 MG tablet; Take 1 tablet (500 mg total) by mouth 2 (two) times daily.  Dispense: 20 tablet; Refill: 0  3. Essential hypertension Decrease salt intake in diet - lisinopril (PRINIVIL,ZESTRIL) 5 MG tablet; TAKE 1 TABLET (5 MG TOTAL) BY MOUTH DAILY.  Dispense: 90 tablet; Refill: 1 - CMP14+EGFR - Lipid panel  4. Gastroesophageal reflux disease without esophagitis No meals up to 2 hours prior to bedtime. No spicy foods - omeprazole (PRILOSEC) 20 MG capsule; TAKE 1 TABLET (20 MG TOTAL) BY MOUTH DAILY.  Dispense: 30 capsule; Refill: 5  5. Hypothyroidism, unspecified hypothyroidism type - levothyroxine (SYNTHROID, LEVOTHROID) 100 MCG tablet; TAKE 1 TABLET (100 MCG TOTAL) BY MOUTH DAILY.  Dispense: 90 tablet; Refill: 2 - Thyroid Panel With TSH  6. GAD (generalized anxiety disorder) Stress management. Continue massage therapy  7. BMI 34.0-34.9,adult Discussed low fat diet   8. Low back strain, initial encounter Muscle relaxer at night only when needed - cyclobenzaprine (FLEXERIL) 10 MG tablet; Take 1 tablet (10 mg total) by mouth 3 (three) times daily as needed for muscle spasms.  Dispense: 30 tablet; Refill: 1  9. Need for hepatitis C screening test - Hepatitis C antibody  10. Asymptomatic menopausal state - DG Bone Density; Future   Continue all meds Labs pending Health Maintenance reviewed Diet and exercise encouraged RTO 6 months  Rosalio Loud FNP Student Mary-Margaret Hassell Done, FNP

## 2015-11-20 NOTE — Addendum Note (Signed)
Addended by: Rolena Infante on: 11/20/2015 10:25 AM   Modules accepted: Orders

## 2015-11-20 NOTE — Patient Instructions (Signed)

## 2015-11-21 LAB — LIPID PANEL
CHOLESTEROL TOTAL: 214 mg/dL — AB (ref 100–199)
Chol/HDL Ratio: 3.6 ratio units (ref 0.0–4.4)
HDL: 59 mg/dL (ref >39)
LDL CALC: 135 mg/dL — AB (ref 0–99)
TRIGLYCERIDES: 99 mg/dL (ref 0–149)
VLDL CHOLESTEROL CAL: 20 mg/dL (ref 5–40)

## 2015-11-21 LAB — CMP14+EGFR
ALK PHOS: 187 IU/L — AB (ref 39–117)
ALT: 27 IU/L (ref 0–32)
AST: 33 IU/L (ref 0–40)
Albumin/Globulin Ratio: 1.7 (ref 1.2–2.2)
Albumin: 4.8 g/dL (ref 3.6–4.8)
BUN/Creatinine Ratio: 12 (ref 11–26)
BUN: 11 mg/dL (ref 8–27)
Bilirubin Total: 0.5 mg/dL (ref 0.0–1.2)
CALCIUM: 10.3 mg/dL (ref 8.7–10.3)
CO2: 24 mmol/L (ref 18–29)
CREATININE: 0.91 mg/dL (ref 0.57–1.00)
Chloride: 99 mmol/L (ref 96–106)
GFR calc Af Amer: 77 mL/min/{1.73_m2} (ref >59)
GFR, EST NON AFRICAN AMERICAN: 66 mL/min/{1.73_m2} (ref >59)
GLOBULIN, TOTAL: 2.8 g/dL (ref 1.5–4.5)
GLUCOSE: 103 mg/dL — AB (ref 65–99)
Potassium: 4.6 mmol/L (ref 3.5–5.2)
SODIUM: 137 mmol/L (ref 134–144)
Total Protein: 7.6 g/dL (ref 6.0–8.5)

## 2015-11-21 LAB — THYROID PANEL WITH TSH
Free Thyroxine Index: 2.5 (ref 1.2–4.9)
T3 Uptake Ratio: 20 % — ABNORMAL LOW (ref 24–39)
T4 TOTAL: 12.7 ug/dL — AB (ref 4.5–12.0)
TSH: 1.81 u[IU]/mL (ref 0.450–4.500)

## 2015-11-21 LAB — HCV ANTIBODY: Hep C Virus Ab: 0.2 s/co ratio (ref 0.0–0.9)

## 2015-11-23 LAB — URINE CULTURE

## 2015-11-27 ENCOUNTER — Ambulatory Visit (INDEPENDENT_AMBULATORY_CARE_PROVIDER_SITE_OTHER): Payer: Commercial Managed Care - HMO | Admitting: Family Medicine

## 2015-11-27 ENCOUNTER — Other Ambulatory Visit: Payer: Self-pay | Admitting: Nurse Practitioner

## 2015-11-27 ENCOUNTER — Encounter: Payer: Self-pay | Admitting: Family Medicine

## 2015-11-27 VITALS — BP 144/86 | HR 82 | Temp 97.0°F | Ht 66.0 in | Wt 223.6 lb

## 2015-11-27 DIAGNOSIS — R300 Dysuria: Secondary | ICD-10-CM

## 2015-11-27 DIAGNOSIS — N3000 Acute cystitis without hematuria: Secondary | ICD-10-CM | POA: Diagnosis not present

## 2015-11-27 LAB — URINALYSIS, COMPLETE
Bilirubin, UA: NEGATIVE
GLUCOSE, UA: NEGATIVE
KETONES UA: NEGATIVE
Nitrite, UA: NEGATIVE
PROTEIN UA: NEGATIVE
Urobilinogen, Ur: 0.2 mg/dL (ref 0.2–1.0)
pH, UA: 5.5 (ref 5.0–7.5)

## 2015-11-27 LAB — MICROSCOPIC EXAMINATION

## 2015-11-27 MED ORDER — FLUCONAZOLE 150 MG PO TABS: 150.0000 mg | ORAL_TABLET | Freq: Once | ORAL | Status: DC

## 2015-11-27 MED ORDER — CEPHALEXIN 500 MG PO CAPS: 500.0000 mg | ORAL_CAPSULE | Freq: Three times a day (TID) | ORAL | Status: DC

## 2015-11-27 NOTE — Telephone Encounter (Signed)
Last filled 10/16/15, last seen 11/20/15. [facility]

## 2015-11-27 NOTE — Patient Instructions (Signed)
Great to meet you!  Come back if you have any concerns  We are sending your urine for a culture  Complete all antibiotics

## 2015-11-27 NOTE — Progress Notes (Signed)
   HPI  Patient presents today here with concern for ciprofloxacin allergic reaction.  Patient explains that she was treated last week with Cipro, her urine culture showed Klebsiella pneumonia, this Cipro sensitive. Patient finished 500 mg twice daily of Cipro on 3 separate days last week, on one day in between she took one dose, another day she took one half dose twice daily She had abdominal pain, increasing anxiety throughout the week, and then a red rash on her face 5 days after starting. The rash and abdominal pain have improved.  She has some mild vulvar irritation and vaginal discharge she thinks is a yeast infection  PMH: Smoking status noted ROS: Per HPI  Objective: BP 144/86 mmHg  Pulse 82  Temp(Src) 97 F (36.1 C) (Oral)  Ht 5\' 6"  (1.676 m)  Wt 223 lb 9.6 oz (101.424 kg)  BMI 36.11 kg/m2 Gen: NAD, alert, cooperative with exam HEENT: NCAT CV: RRR, good S1/S2, no murmur Resp: CTABL, no wheezes, non-labored Ext: No edema, warm Neuro: Alert and oriented, No gross deficits  Assessment and plan:  # UTI With interrupted course and indeterminate UA I will treat with keflex- given option of waiting for culture and she would like to treat Diflucan for yeast RTC with any concerns Culture pending    Orders Placed This Encounter  Procedures  . Urine culture  . Urinalysis, Complete    Meds ordered this encounter  Medications  . fluconazole (DIFLUCAN) 150 MG tablet    Sig: Take 1 tablet (150 mg total) by mouth once. Repeat 3 days later    Dispense:  2 tablet    Refill:  0  . cephALEXin (KEFLEX) 500 MG capsule    Sig: Take 1 capsule (500 mg total) by mouth 3 (three) times daily.    Dispense:  21 capsule    Refill:  [place], MD [facility] 11/27/2015, 9:38 AM

## 2015-11-28 NOTE — Telephone Encounter (Signed)
Please call in xanax with 1 refills 

## 2015-11-29 LAB — URINE CULTURE

## 2015-12-23 ENCOUNTER — Other Ambulatory Visit: Payer: Self-pay | Admitting: Nurse Practitioner

## 2015-12-27 DIAGNOSIS — L718 Other rosacea: Secondary | ICD-10-CM | POA: Diagnosis not present

## 2015-12-27 DIAGNOSIS — B9689 Other specified bacterial agents as the cause of diseases classified elsewhere: Secondary | ICD-10-CM | POA: Diagnosis not present

## 2015-12-27 DIAGNOSIS — L0202 Furuncle of face: Secondary | ICD-10-CM | POA: Diagnosis not present

## 2016-02-06 ENCOUNTER — Other Ambulatory Visit: Payer: Self-pay | Admitting: Nurse Practitioner

## 2016-02-06 NOTE — Telephone Encounter (Signed)
Please call in ativan with 1 refills 

## 2016-02-06 NOTE — Telephone Encounter (Signed)
Last seen 11/27/15  Dr Wendi Snipes  MMM PCP  If approved route to nurse to [facility] into [facility]

## 2016-02-06 NOTE — Telephone Encounter (Signed)
rx called into pharmacy

## 2016-03-08 ENCOUNTER — Encounter: Payer: Self-pay | Admitting: Nurse Practitioner

## 2016-03-08 ENCOUNTER — Ambulatory Visit (INDEPENDENT_AMBULATORY_CARE_PROVIDER_SITE_OTHER): Payer: Commercial Managed Care - HMO | Admitting: Nurse Practitioner

## 2016-03-08 VITALS — BP 118/70 | HR 70 | Temp 97.8°F | Ht 66.0 in | Wt 220.0 lb

## 2016-03-08 DIAGNOSIS — I10 Essential (primary) hypertension: Secondary | ICD-10-CM | POA: Diagnosis not present

## 2016-03-08 DIAGNOSIS — F411 Generalized anxiety disorder: Secondary | ICD-10-CM

## 2016-03-08 DIAGNOSIS — Z6834 Body mass index (BMI) 34.0-34.9, adult: Secondary | ICD-10-CM

## 2016-03-08 DIAGNOSIS — K219 Gastro-esophageal reflux disease without esophagitis: Secondary | ICD-10-CM

## 2016-03-08 DIAGNOSIS — E039 Hypothyroidism, unspecified: Secondary | ICD-10-CM | POA: Diagnosis not present

## 2016-03-08 MED ORDER — LISINOPRIL 5 MG PO TABS: ORAL_TABLET | ORAL | Status: DC

## 2016-03-08 MED ORDER — LEVOTHYROXINE SODIUM 100 MCG PO TABS: ORAL_TABLET | ORAL | Status: DC

## 2016-03-08 MED ORDER — OMEPRAZOLE 20 MG PO CPDR: DELAYED_RELEASE_CAPSULE | ORAL | Status: DC

## 2016-03-08 NOTE — Progress Notes (Signed)
Subjective:    Patient ID: Kelly Lewis, female    DOB: Feb 22, 1951, 65 y.o.   MRN: [id number]  Patient here today for follow up of chronic medical problems. No complaints today  Outpatient Encounter Prescriptions as of 03/08/2016  Medication Sig  . Cholecalciferol (VITAMIN D3) 2000 UNITS TABS Take by mouth.  . cyclobenzaprine (FLEXERIL) 10 MG tablet Take 1 tablet (10 mg total) by mouth 3 (three) times daily as needed for muscle spasms.  Marland Kitchen levothyroxine (SYNTHROID, LEVOTHROID) 100 MCG tablet TAKE 1 TABLET (100 MCG TOTAL) BY MOUTH DAILY.  Marland Kitchen lisinopril (PRINIVIL,ZESTRIL) 5 MG tablet TAKE 1 TABLET (5 MG TOTAL) BY MOUTH DAILY.  Marland Kitchen LORazepam (ATIVAN) 0.5 MG tablet TAKE 1 TABLET 2 TIMES A DAY AS NEEDED  . omeprazole (PRILOSEC) 20 MG capsule TAKE 1 TABLET (20 MG TOTAL) BY MOUTH DAILY.    Hypertension This is a chronic problem. The current episode started more than 1 year ago. The problem is unchanged. The problem is controlled (824-235 systolic). Pertinent negatives include no chest pain, headaches, palpitations or shortness of breath. Risk factors for coronary artery disease include dyslipidemia, post-menopausal state and sedentary lifestyle. Past treatments include ACE inhibitors. The current treatment provides moderate improvement. Compliance problems include diet and exercise.   Gastroesophageal Reflux She reports no chest pain, no coughing, no sore throat or no wheezing.  hyperlipidemia They wanted patient to start to medication at last check but patient refused. Has been dieting and wants to see if has improved. Hasn't been exercising very often GAD Mother is 1 years old and she is helping take care of her which makes her very anxious Hypothyroidism Levothyroxine 7mg daily- Tolerating well- No C/O fatigue.  Review of Systems  Constitutional: Negative.   HENT: Negative.  Negative for sore throat.   Respiratory: Negative for cough, shortness of breath and wheezing.   Cardiovascular:  Negative for chest pain and palpitations.  Genitourinary: Negative.   Neurological: Negative for headaches.  Hematological: Bruises/bleeds easily.  Psychiatric/Behavioral: Negative.   All other systems reviewed and are negative.      Objective:   Physical Exam  Constitutional: She is oriented to person, place, and time. She appears well-developed and well-nourished.  HENT:  Nose: Nose normal.  Mouth/Throat: Oropharynx is clear and moist.  Eyes: EOM are normal.  Neck: Trachea normal, normal range of motion and full passive range of motion without pain. Neck supple. No JVD present. Carotid bruit is not present. No thyromegaly present.  Cardiovascular: Normal rate, regular rhythm, normal heart sounds and intact distal pulses.  Exam reveals no gallop and no friction rub.   No murmur heard. Pulmonary/Chest: Effort normal and breath sounds normal.  Abdominal: Soft. Bowel sounds are normal. She exhibits no distension and no mass. There is no tenderness.  Musculoskeletal: Normal range of motion.  Lymphadenopathy:    She has no cervical adenopathy.  Neurological: She is alert and oriented to person, place, and time. She has normal reflexes.  Skin: Skin is warm and dry.  Psychiatric: She has a normal mood and affect. Her behavior is normal. Judgment and thought content normal.    BP 118/70 mmHg  Pulse 70  Temp(Src) 97.8 F (36.6 C) (Oral)  Ht '5\' 6"'$  (1.676 m)  Wt 220 lb (99.791 kg)  BMI 35.53 kg/m2        Assessment & Plan:   1. Essential hypertension Do not add salt to diet - lisinopril (PRINIVIL,ZESTRIL) 5 MG tablet; TAKE 1 TABLET (5 MG TOTAL) BY MOUTH  DAILY.  Dispense: 90 tablet; Refill: 1 - CMP14+EGFR - Lipid panel  2. Gastroesophageal reflux disease without esophagitis Avoid spicy foods Do not eat 2 hours prior to bedtime - omeprazole (PRILOSEC) 20 MG capsule; TAKE 1 TABLET (20 MG TOTAL) BY MOUTH DAILY.  Dispense: 30 capsule; Refill: 5  3. Hypothyroidism, unspecified  hypothyroidism type  - levothyroxine (SYNTHROID, LEVOTHROID) 100 MCG tablet; TAKE 1 TABLET (100 MCG TOTAL) BY MOUTH DAILY.  Dispense: 90 tablet; Refill: 2  4. GAD (generalized anxiety disorder) Stress management  5. BMI 34.0-34.9,adult Discussed diet and exercise for person with BMI >25 Will recheck weight in 3-6 months     Labs pending Health maintenance reviewed Diet and exercise encouraged Continue all meds Follow up  In 6 month   [place], FNP

## 2016-03-08 NOTE — Patient Instructions (Signed)

## 2016-03-09 LAB — LIPID PANEL
Chol/HDL Ratio: 3.8 ratio units (ref 0.0–4.4)
Cholesterol, Total: 204 mg/dL — ABNORMAL HIGH (ref 100–199)
HDL: 53 mg/dL (ref >39)
LDL Calculated: 132 mg/dL — ABNORMAL HIGH (ref 0–99)
Triglycerides: 93 mg/dL (ref 0–149)
VLDL Cholesterol Cal: 19 mg/dL (ref 5–40)

## 2016-03-09 LAB — ANEMIA PROFILE B
BASOS ABS: 0 10*3/uL (ref 0.0–0.2)
Basos: 0 %
EOS (ABSOLUTE): 0.1 10*3/uL (ref 0.0–0.4)
Eos: 1 %
Ferritin: 53 ng/mL (ref 15–150)
Folate: 4.8 ng/mL (ref >3.0)
Hematocrit: 40.6 % (ref 34.0–46.6)
Hemoglobin: 13 g/dL (ref 11.1–15.9)
IMMATURE GRANS (ABS): 0 10*3/uL (ref 0.0–0.1)
IMMATURE GRANULOCYTES: 0 %
IRON: 117 ug/dL (ref 27–139)
Iron Saturation: 35 % (ref 15–55)
LYMPHS: 34 %
Lymphocytes Absolute: 1.9 10*3/uL (ref 0.7–3.1)
MCH: 29.3 pg (ref 26.6–33.0)
MCHC: 32 g/dL (ref 31.5–35.7)
MCV: 92 fL (ref 79–97)
MONOCYTES: 8 %
Monocytes Absolute: 0.5 10*3/uL (ref 0.1–0.9)
NEUTROS ABS: 3.2 10*3/uL (ref 1.4–7.0)
NEUTROS PCT: 57 %
PLATELETS: 157 10*3/uL (ref 150–379)
RBC: 4.43 x10E6/uL (ref 3.77–5.28)
RDW: 13.8 % (ref 12.3–15.4)
RETIC CT PCT: 1.1 % (ref 0.6–2.6)
TIBC: 337 ug/dL (ref 250–450)
UIBC: 220 ug/dL (ref 118–369)
VITAMIN B 12: 292 pg/mL (ref 211–946)
WBC: 5.6 10*3/uL (ref 3.4–10.8)

## 2016-03-09 LAB — CMP14+EGFR
A/G RATIO: 1.7 (ref 1.2–2.2)
ALT: 19 IU/L (ref 0–32)
AST: 24 IU/L (ref 0–40)
Albumin: 4.5 g/dL (ref 3.6–4.8)
Alkaline Phosphatase: 154 IU/L — ABNORMAL HIGH (ref 39–117)
BILIRUBIN TOTAL: 0.6 mg/dL (ref 0.0–1.2)
BUN/Creatinine Ratio: 14 (ref 12–28)
BUN: 12 mg/dL (ref 8–27)
CHLORIDE: 98 mmol/L (ref 96–106)
CO2: 21 mmol/L (ref 18–29)
Calcium: 10 mg/dL (ref 8.7–10.3)
Creatinine, Ser: 0.87 mg/dL (ref 0.57–1.00)
GFR calc non Af Amer: 70 mL/min/{1.73_m2} (ref >59)
GFR, EST AFRICAN AMERICAN: 81 mL/min/{1.73_m2} (ref >59)
GLOBULIN, TOTAL: 2.6 g/dL (ref 1.5–4.5)
Glucose: 96 mg/dL (ref 65–99)
POTASSIUM: 4.6 mmol/L (ref 3.5–5.2)
SODIUM: 134 mmol/L (ref 134–144)
TOTAL PROTEIN: 7.1 g/dL (ref 6.0–8.5)

## 2016-03-23 ENCOUNTER — Other Ambulatory Visit: Payer: Self-pay | Admitting: Nurse Practitioner

## 2016-05-01 ENCOUNTER — Other Ambulatory Visit: Payer: Self-pay | Admitting: Nurse Practitioner

## 2016-05-02 NOTE — Telephone Encounter (Signed)
Patient aware, script for ativan is ready.

## 2016-06-25 IMAGING — CR DG CHEST 2V
2 series · 2 of 2 positions shown · non-contrast
Comparison: 11/24/2010.

CLINICAL DATA: Yearly physical examination. Essential hypertension.

EXAM:
CHEST  2 VIEW

[view not recorded (1 of 2)]
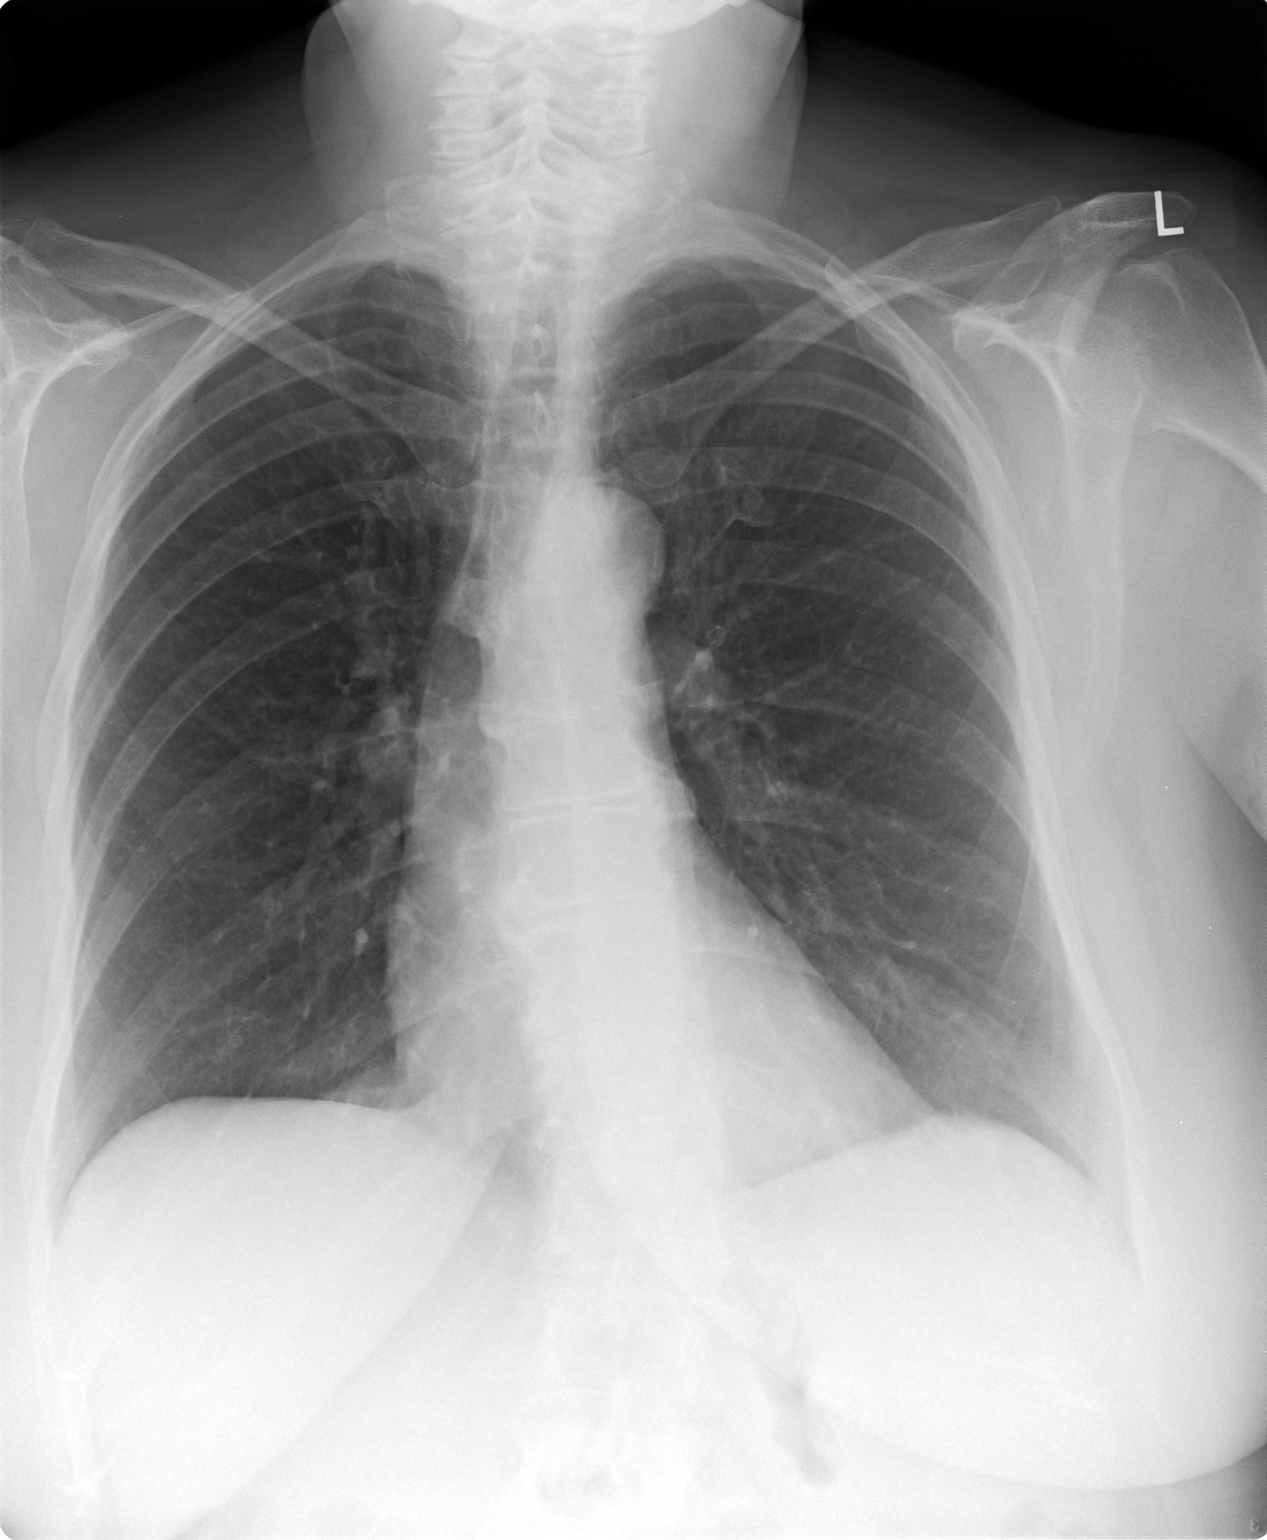

[view not recorded (2 of 2)]
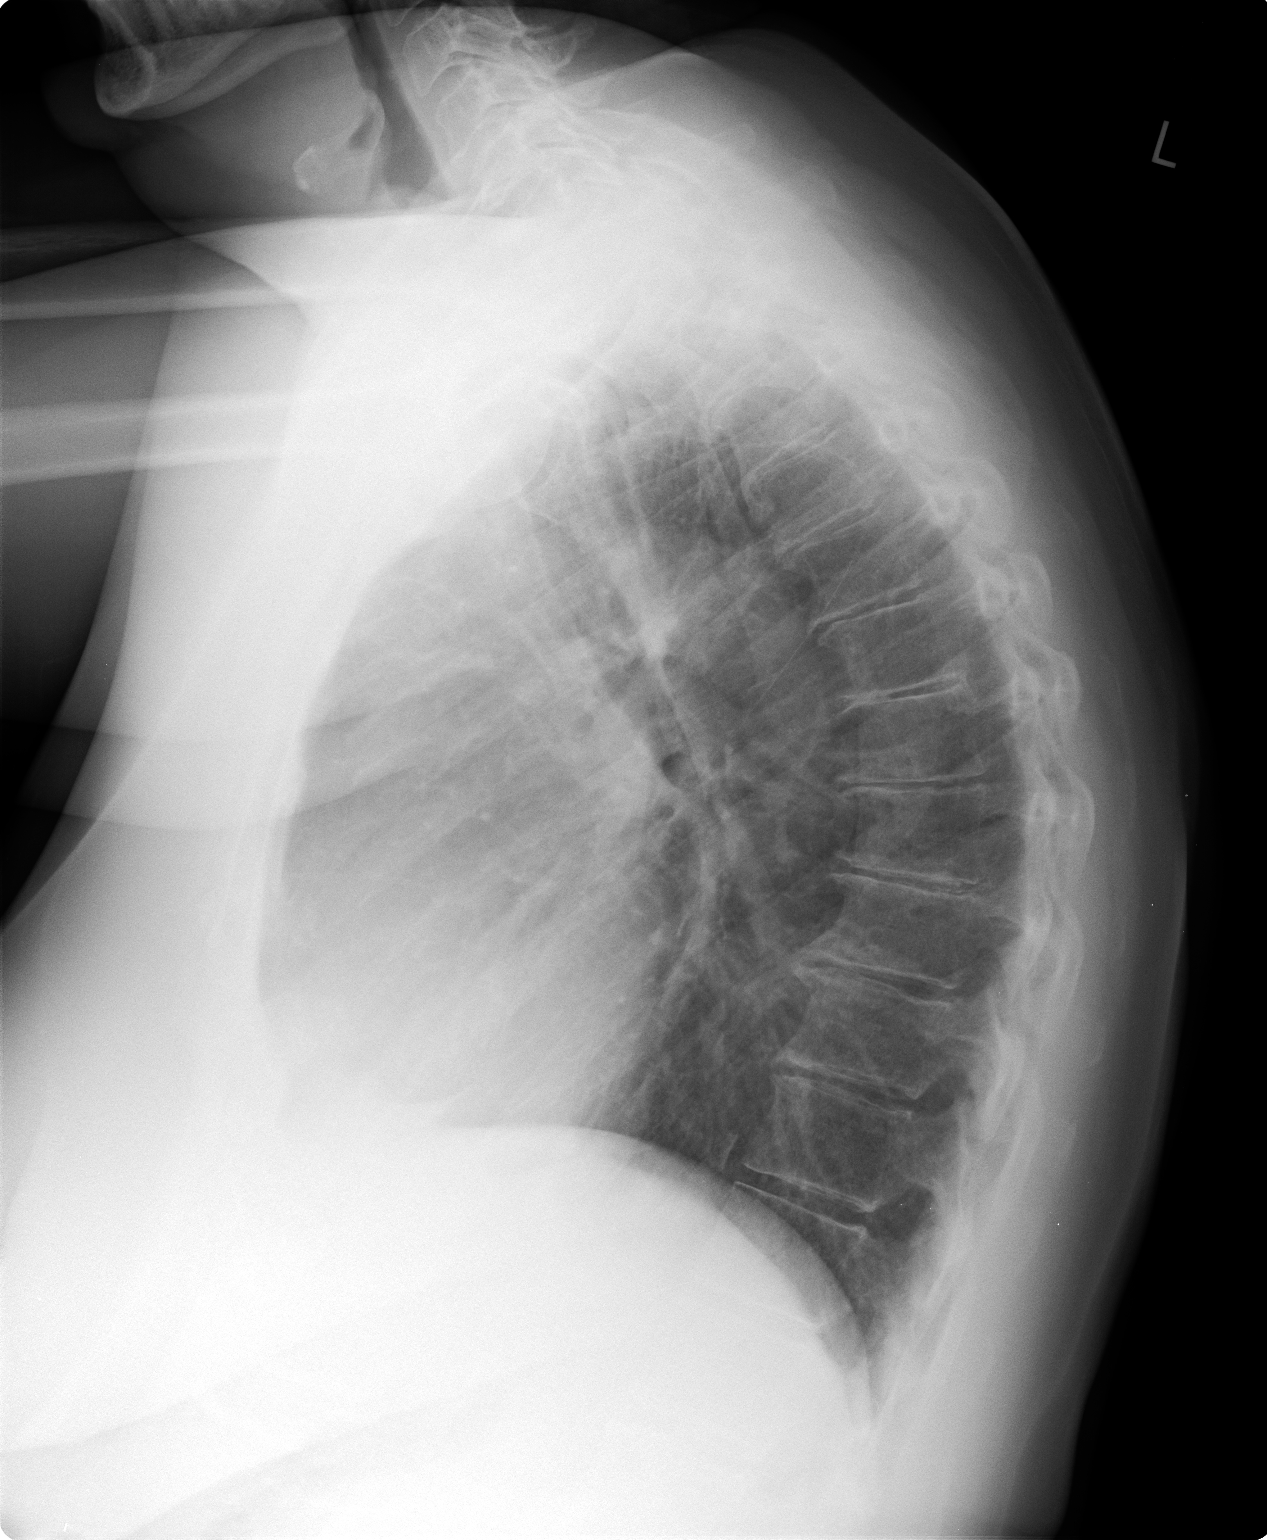

[2 of 2 positions shown; findings below may reference images not displayed]

FINDINGS: Normal sized heart. Clear lungs with normal vascularity.
Thoracolumbar spine degenerative changes and scoliosis with mild
progression of both.
IMPRESSION: No acute abnormality.

## 2016-07-16 ENCOUNTER — Ambulatory Visit (INDEPENDENT_AMBULATORY_CARE_PROVIDER_SITE_OTHER): Payer: Commercial Managed Care - HMO | Admitting: Nurse Practitioner

## 2016-07-16 ENCOUNTER — Encounter: Payer: Self-pay | Admitting: Nurse Practitioner

## 2016-07-16 VITALS — BP 154/92 | HR 95 | Temp 98.1°F | Ht 66.0 in | Wt 223.0 lb

## 2016-07-16 DIAGNOSIS — F411 Generalized anxiety disorder: Secondary | ICD-10-CM | POA: Diagnosis not present

## 2016-07-16 DIAGNOSIS — I10 Essential (primary) hypertension: Secondary | ICD-10-CM

## 2016-07-16 DIAGNOSIS — M5431 Sciatica, right side: Secondary | ICD-10-CM | POA: Diagnosis not present

## 2016-07-16 MED ORDER — METHYLPREDNISOLONE ACETATE 80 MG/ML IJ SUSP
80.0000 mg | Freq: Once | INTRAMUSCULAR | Status: AC
  Administered 2016-07-16: 80 mg via INTRAMUSCULAR

## 2016-07-16 MED ORDER — LISINOPRIL 10 MG PO TABS: 10.0000 mg | ORAL_TABLET | Freq: Every day | ORAL | 3 refills | Status: DC

## 2016-07-16 MED ORDER — PREDNISONE 10 MG (21) PO TBPK: ORAL_TABLET | ORAL | 0 refills | Status: DC

## 2016-07-16 MED ORDER — LORAZEPAM 0.5 MG PO TABS: ORAL_TABLET | ORAL | 1 refills | Status: DC

## 2016-07-16 NOTE — Progress Notes (Signed)
   Subjective:    Patient ID: Kelly Lewis, female    DOB: 21-Mar-1951, 65 y.o.   MRN: [id number]  HPI Patient here today with multiple complaints:  * Burning and sharp pain from her right  iliosacral gluteal area down her lower leg to her toes( last two toes). Pain started off an on for the past 3 months. Denies any recent trauma or fall.  * increased home blood pressure checks, SBP usually above 160s  * Increased anxiety   Review of Systems  Constitutional: Negative.   HENT: Negative.   Eyes: Negative.   Respiratory: Negative.   Cardiovascular: Negative.   Gastrointestinal: Negative for diarrhea, nausea and vomiting.  Endocrine: Negative.   Genitourinary: Negative for urgency.  Neurological: Positive for numbness (right 4th and 5th toes) and headaches. Negative for dizziness, tremors, seizures, weakness and light-headedness.  Psychiatric/Behavioral: Positive for sleep disturbance.       Objective:   Physical Exam  Constitutional: She is oriented to person, place, and time. She appears well-developed and well-nourished. No distress.  HENT:  Head: Normocephalic and atraumatic.  Eyes: Conjunctivae and EOM are normal. Pupils are equal, round, and reactive to light.  Neck: Normal range of motion. Neck supple.  Cardiovascular: Normal rate, regular rhythm, normal heart sounds and intact distal pulses.   Pulmonary/Chest: Effort normal and breath sounds normal. She has no wheezes.  Musculoskeletal: Normal range of motion.  Positive straight-leg test at 45 degrees, positive heel-toe walk test. No deformity noted  Neurological: She is alert and oriented to person, place, and time.  Skin: Skin is warm and dry.  Psychiatric: She has a normal mood and affect. Her behavior is normal. Judgment and thought content normal.   BP (!) 154/92 (BP Location: Left Arm, Cuff Size: Large)   Pulse 95   Temp 98.1 F (36.7 C) (Oral)   Ht _0  (1.676 m)   Wt 223 lb (101.2 kg)   BMI 35.99 kg/m       Assessment & Plan:  1. Sciatica of right side Apply moist heat as needed Take prednisone with food - methylPREDNISolone acetate (DEPO-MEDROL) injection 80 mg; Inject 1 mL (80 mg total) into the muscle once. - predniSONE (STERAPRED UNI-PAK 21 TAB) 10 MG (21) TBPK tablet; As directed x 6 days  Dispense: 21 tablet; Refill: 0  2. Essential hypertension Low salt diet Lisinopril increased from 5 mg to 10 mg - lisinopril (PRINIVIL,ZESTRIL) 10 MG tablet; Take 1 tablet (10 mg total) by mouth daily.  Dispense: 90 tablet; Refill: 3 - CMP14+EGFR  3. Anxiety     Stress management strategies     Continue Lorazepam as scheduled    Labs pending Health maintenance reviewed Diet and exercise encouraged Continue all meds Follow up  in 4 weeks  Jari Favre, RN,BSN, AGNP Student   Mary-Margaret Hassell Done, FNP

## 2016-07-16 NOTE — Patient Instructions (Signed)
Sciatica Introduction Sciatica is pain, numbness, weakness, or tingling along your sciatic nerve. The sciatic nerve starts in the lower back and goes down the back of each leg. Sciatica happens when this nerve is pinched or has pressure put on it. Sciatica usually goes away on its own or with treatment. Sometimes, sciatica may keep coming back (recur). Follow these instructions at home: Medicines  Take over-the-counter and prescription medicines only as told by your doctor.  Do not drive or use heavy machinery while taking prescription pain medicine. Managing pain  If directed, put ice on the affected area.  Put ice in a plastic bag.  Place a towel between your skin and the bag.  Leave the ice on for 20 minutes, 2-3 times a day.  After icing, apply heat to the affected area before you exercise or as often as told by your doctor. Use the heat source that your doctor tells you to use, such as a moist heat pack or a heating pad.  Place a towel between your skin and the heat source.  Leave the heat on for 20-30 minutes.  Remove the heat if your skin turns bright red. This is especially important if you are unable to feel pain, heat, or cold. You may have a greater risk of getting burned. Activity  Return to your normal activities as told by your doctor. Ask your doctor what activities are safe for you.  Avoid activities that make your sciatica worse.  Take short rests during the day. Rest in a lying or standing position. This is usually better than sitting to rest.  When you rest for a long time, do some physical activity or stretching between periods of rest.  Avoid sitting for a long time without moving. Get up and move around at least one time each hour.  Exercise and stretch regularly, as told by your doctor.  Do not lift anything that is heavier than 10 lb (4.5 kg) while you have symptoms of sciatica.  Avoid lifting heavy things even when you do not have symptoms.  Avoid  lifting heavy things over and over.  When you lift objects, always lift in a way that is safe for your body. To do this, you should:  Bend your knees.  Keep the object close to your body.  Avoid twisting. General instructions  Use good posture.  Avoid leaning forward when you are sitting.  Avoid hunching over when you are standing.  Stay at a healthy weight.  Wear comfortable shoes that support your feet. Avoid wearing high heels.  Avoid sleeping on a mattress that is too soft or too hard. You might have less pain if you sleep on a mattress that is firm enough to support your back.  Keep all follow-up visits as told by your doctor. This is important. Contact a doctor if:  You have pain that:  Wakes you up when you are sleeping.  Gets worse when you lie down.  Is worse than the pain you have had in the past.  Lasts longer than 4 weeks.  You lose weight for without trying. Get help right away if:  You cannot control when you pee (urinate) or poop (have a bowel movement).  You have weakness in any of these areas and it gets worse.  Lower back.  Lower belly (pelvis).  Butt (buttocks).  Legs.  You have redness or swelling of your back.  You have a burning feeling when you pee. This information is not intended to  replace advice given to you by your health care provider. Make sure you discuss any questions you have with your health care provider. Document Released: 05/28/2008 Document Revised: 01/25/2016 Document Reviewed: 04/28/2015  2017 Elsevier

## 2016-07-17 LAB — CMP14+EGFR
A/G RATIO: 1.6 (ref 1.2–2.2)
ALBUMIN: 4.6 g/dL (ref 3.6–4.8)
ALT: 21 IU/L (ref 0–32)
AST: 27 IU/L (ref 0–40)
Alkaline Phosphatase: 161 IU/L — ABNORMAL HIGH (ref 39–117)
BILIRUBIN TOTAL: 0.5 mg/dL (ref 0.0–1.2)
BUN / CREAT RATIO: 11 — AB (ref 12–28)
BUN: 10 mg/dL (ref 8–27)
CHLORIDE: 97 mmol/L (ref 96–106)
CO2: 24 mmol/L (ref 18–29)
Calcium: 10 mg/dL (ref 8.7–10.3)
Creatinine, Ser: 0.9 mg/dL (ref 0.57–1.00)
GFR calc non Af Amer: 67 mL/min/{1.73_m2} (ref >59)
GFR, EST AFRICAN AMERICAN: 78 mL/min/{1.73_m2} (ref >59)
Globulin, Total: 2.9 g/dL (ref 1.5–4.5)
Glucose: 97 mg/dL (ref 65–99)
POTASSIUM: 4.3 mmol/L (ref 3.5–5.2)
Sodium: 137 mmol/L (ref 134–144)
TOTAL PROTEIN: 7.5 g/dL (ref 6.0–8.5)

## 2016-09-09 ENCOUNTER — Ambulatory Visit (INDEPENDENT_AMBULATORY_CARE_PROVIDER_SITE_OTHER): Payer: Medicare Other | Admitting: Nurse Practitioner

## 2016-09-09 ENCOUNTER — Encounter: Payer: Self-pay | Admitting: Nurse Practitioner

## 2016-09-09 VITALS — BP 137/78 | HR 82 | Temp 97.5°F | Ht 66.0 in | Wt 224.0 lb

## 2016-09-09 DIAGNOSIS — F411 Generalized anxiety disorder: Secondary | ICD-10-CM | POA: Diagnosis not present

## 2016-09-09 DIAGNOSIS — K219 Gastro-esophageal reflux disease without esophagitis: Secondary | ICD-10-CM | POA: Diagnosis not present

## 2016-09-09 DIAGNOSIS — I10 Essential (primary) hypertension: Secondary | ICD-10-CM

## 2016-09-09 DIAGNOSIS — Z6836 Body mass index (BMI) 36.0-36.9, adult: Secondary | ICD-10-CM | POA: Diagnosis not present

## 2016-09-09 DIAGNOSIS — E039 Hypothyroidism, unspecified: Secondary | ICD-10-CM

## 2016-09-09 MED ORDER — OMEPRAZOLE 20 MG PO CPDR: DELAYED_RELEASE_CAPSULE | ORAL | 5 refills | Status: DC

## 2016-09-09 MED ORDER — LORAZEPAM 0.5 MG PO TABS: ORAL_TABLET | ORAL | 1 refills | Status: DC

## 2016-09-09 NOTE — Progress Notes (Signed)
Subjective:    Patient ID: Kelly Lewis, female    DOB: 12/25/1950, 66 y.o.   MRN: [id number]  Patient here today for follow up of chronic medical problems. No complaints today.  Outpatient Encounter Prescriptions as of 03/08/2016  Medication Sig  . Cholecalciferol (VITAMIN D3) 2000 UNITS TABS Take by mouth.  . cyclobenzaprine (FLEXERIL) 10 MG tablet Take 1 tablet (10 mg total) by mouth 3 (three) times daily as needed for muscle spasms.  Marland Kitchen levothyroxine (SYNTHROID, LEVOTHROID) 100 MCG tablet TAKE 1 TABLET (100 MCG TOTAL) BY MOUTH DAILY.  Marland Kitchen lisinopril (PRINIVIL,ZESTRIL) 5 MG tablet TAKE 1 TABLET (5 MG TOTAL) BY MOUTH DAILY.  Marland Kitchen LORazepam (ATIVAN) 0.5 MG tablet TAKE 1 TABLET 2 TIMES A DAY AS NEEDED  . omeprazole (PRILOSEC) 20 MG capsule TAKE 1 TABLET (20 MG TOTAL) BY MOUTH DAILY.    Hypertension  This is a chronic problem. The current episode started more than 1 year ago. The problem is unchanged. The problem is controlled (403-474 systolic). Pertinent negatives include no chest pain, headaches, palpitations or shortness of breath. Risk factors for coronary artery disease include dyslipidemia, post-menopausal state and sedentary lifestyle. Past treatments include ACE inhibitors. The current treatment provides moderate improvement. Compliance problems include diet and exercise.   Gastroesophageal Reflux  She reports no chest pain, no coughing, no sore throat or no wheezing.  hyperlipidemia They wanted patient to start to medication at last check but patient refused. Has been dieting and wants to see if has improved. Hasn't been exercising very often GAD Mother is 12 years old and she is helping take care of her which makes her very anxious Hypothyroidism Levothyroxine 59mg daily- Tolerating well- No C/O fatigue.  Review of Systems  Constitutional: Negative.   HENT: Negative.  Negative for sore throat.   Respiratory: Negative for cough, shortness of breath and wheezing.   Cardiovascular:  Negative for chest pain and palpitations.  Genitourinary: Negative.   Neurological: Negative for headaches.  Hematological: Bruises/bleeds easily.  Psychiatric/Behavioral: Negative.   All other systems reviewed and are negative.      Objective:   Physical Exam  Constitutional: She is oriented to person, place, and time. She appears well-developed and well-nourished.  HENT:  Nose: Nose normal.  Mouth/Throat: Oropharynx is clear and moist.  Eyes: EOM are normal.  Neck: Trachea normal, normal range of motion and full passive range of motion without pain. Neck supple. No JVD present. Carotid bruit is not present. No thyromegaly present.  Cardiovascular: Normal rate, regular rhythm, normal heart sounds and intact distal pulses.  Exam reveals no gallop and no friction rub.   No murmur heard. Pulmonary/Chest: Effort normal and breath sounds normal.  Abdominal: Soft. Bowel sounds are normal. She exhibits no distension and no mass. There is no tenderness.  Musculoskeletal: Normal range of motion.  Lymphadenopathy:    She has no cervical adenopathy.  Neurological: She is alert and oriented to person, place, and time. She has normal reflexes.  Skin: Skin is warm and dry.  Psychiatric: She has a normal mood and affect. Her behavior is normal. Judgment and thought content normal.    BP 137/78   Pulse 82   Temp 97.5 F (36.4 C) (Oral)   Ht '5\' 6"'$  (1.676 m)   Wt 224 lb (101.6 kg)   BMI 36.15 kg/m       Assessment & Plan:   1. Essential hypertension Low sodium diet - CMP14+EGFR - Lipid panel  2. Gastroesophageal reflux disease without esophagitis  Avoid spicy foods Do not eat 2 hours prior to bedtime - omeprazole (PRILOSEC) 20 MG capsule; TAKE 1 TABLET (20 MG TOTAL) BY MOUTH DAILY.  Dispense: 30 capsule; Refill: 5  3. Hypothyroidism, unspecified type - Thyroid Panel With TSH  4. BMI 36.0-36.9,adult Discussed diet and exercise for person with BMI >25 Will recheck weight in 3-6  months  5. GAD (generalized anxiety disorder) Stress management - LORazepam (ATIVAN) 0.5 MG tablet; TAKE 1 TABLET 2 TIMES A DAY AS NEEDED  Dispense: 60 tablet; Refill: 1    Labs pending Health maintenance reviewed Diet and exercise encouraged Continue all meds Follow up  In 6 month   Anna, FNP

## 2016-09-09 NOTE — Patient Instructions (Signed)
Stress and Stress Management Stress is a normal reaction to life events. It is what you feel when life demands more than you are used to or more than you can handle. Some stress can be useful. For example, the stress reaction can help you catch the last bus of the day, study for a test, or meet a deadline at work. But stress that occurs too often or for too long can cause problems. It can affect your emotional health and interfere with relationships and normal daily activities. Too much stress can weaken your immune system and increase your risk for physical illness. If you already have a medical problem, stress can make it worse. What are the causes? All sorts of life events may cause stress. An event that causes stress for one person may not be stressful for another person. Major life events commonly cause stress. These may be positive or negative. Examples include losing your job, moving into a new home, getting married, having a baby, or losing a loved one. Less obvious life events may also cause stress, especially if they occur day after day or in combination. Examples include working long hours, driving in traffic, caring for children, being in debt, or being in a difficult relationship. What are the signs or symptoms? Stress may cause emotional symptoms including, the following:  Anxiety. This is feeling worried, afraid, on edge, overwhelmed, or out of control.  Anger. This is feeling irritated or impatient.  Depression. This is feeling sad, down, helpless, or guilty.  Difficulty focusing, remembering, or making decisions. Stress may cause physical symptoms, including the following:  Aches and pains. These may affect your head, neck, back, stomach, or other areas of your body.  Tight muscles or clenched jaw.  Low energy or trouble sleeping. Stress may cause unhealthy behaviors, including the following:  Eating to feel better (overeating) or skipping meals.  Sleeping too little, too  much, or both.  Working too much or putting off tasks (procrastination).  Smoking, drinking alcohol, or using drugs to feel better. How is this diagnosed? Stress is diagnosed through an assessment by your health care provider. Your health care provider will ask questions about your symptoms and any stressful life events.Your health care provider will also ask about your medical history and may order blood tests or other tests. Certain medical conditions and medicine can cause physical symptoms similar to stress. Mental illness can cause emotional symptoms and unhealthy behaviors similar to stress. Your health care provider may refer you to a mental health professional for further evaluation. How is this treated? Stress management is the recommended treatment for stress.The goals of stress management are reducing stressful life events and coping with stress in healthy ways. Techniques for reducing stressful life events include the following:  Stress identification. Self-monitor for stress and identify what causes stress for you. These skills may help you to avoid some stressful events.  Time management. Set your priorities, keep a calendar of events, and learn to say "no." These tools can help you avoid making too many commitments. Techniques for coping with stress include the following:  Rethinking the problem. Try to think realistically about stressful events rather than ignoring them or overreacting. Try to find the positives in a stressful situation rather than focusing on the negatives.  Exercise. Physical exercise can release both physical and emotional tension. The key is to find a form of exercise you enjoy and do it regularly.  Relaxation techniques. These relax the body and mind. Examples include yoga,  meditation, tai chi, biofeedback, deep breathing, progressive muscle relaxation, listening to music, being out in nature, journaling, and other hobbies. Again, the key is to find one or  more that you enjoy and can do regularly.  Healthy lifestyle. Eat a balanced diet, get plenty of sleep, and do not smoke. Avoid using alcohol or drugs to relax.  Strong support network. Spend time with family, friends, or other people you enjoy being around.Express your feelings and talk things over with someone you trust. Counseling or talktherapy with a mental health professional may be helpful if you are having difficulty managing stress on your own. Medicine is typically not recommended for the treatment of stress.Talk to your health care provider if you think you need medicine for symptoms of stress. Follow these instructions at home:  Keep all follow-up visits as directed by your health care provider.  Take all medicines as directed by your health care provider. Contact a health care provider if:  Your symptoms get worse or you start having new symptoms.  You feel overwhelmed by your problems and can no longer manage them on your own. Get help right away if:  You feel like hurting yourself or someone else. This information is not intended to replace advice given to you by your health care provider. Make sure you discuss any questions you have with your health care provider. Document Released: 02/12/2001 Document Revised: 01/25/2016 Document Reviewed: 04/13/2013 Elsevier Interactive Patient Education  2017 [employer].

## 2016-09-10 ENCOUNTER — Other Ambulatory Visit: Payer: Self-pay | Admitting: Nurse Practitioner

## 2016-09-10 LAB — CMP14+EGFR
A/G RATIO: 1.5 (ref 1.2–2.2)
ALBUMIN: 4.2 g/dL (ref 3.6–4.8)
ALK PHOS: 148 IU/L — AB (ref 39–117)
ALT: 21 IU/L (ref 0–32)
AST: 25 IU/L (ref 0–40)
BILIRUBIN TOTAL: 0.5 mg/dL (ref 0.0–1.2)
BUN / CREAT RATIO: 10 — AB (ref 12–28)
BUN: 9 mg/dL (ref 8–27)
CO2: 25 mmol/L (ref 18–29)
CREATININE: 0.87 mg/dL (ref 0.57–1.00)
Calcium: 9.8 mg/dL (ref 8.7–10.3)
Chloride: 101 mmol/L (ref 96–106)
GFR calc Af Amer: 81 mL/min/{1.73_m2} (ref >59)
GFR calc non Af Amer: 70 mL/min/{1.73_m2} (ref >59)
GLOBULIN, TOTAL: 2.8 g/dL (ref 1.5–4.5)
Glucose: 98 mg/dL (ref 65–99)
Potassium: 4.6 mmol/L (ref 3.5–5.2)
SODIUM: 140 mmol/L (ref 134–144)
Total Protein: 7 g/dL (ref 6.0–8.5)

## 2016-09-10 LAB — THYROID PANEL WITH TSH
FREE THYROXINE INDEX: 2.7 (ref 1.2–4.9)
T3 UPTAKE RATIO: 23 % — AB (ref 24–39)
T4, Total: 11.6 ug/dL (ref 4.5–12.0)
TSH: 1.92 u[IU]/mL (ref 0.450–4.500)

## 2016-09-10 LAB — LIPID PANEL
CHOLESTEROL TOTAL: 221 mg/dL — AB (ref 100–199)
Chol/HDL Ratio: 3.6 ratio units (ref 0.0–4.4)
HDL: 61 mg/dL (ref >39)
LDL CALC: 141 mg/dL — AB (ref 0–99)
TRIGLYCERIDES: 94 mg/dL (ref 0–149)
VLDL Cholesterol Cal: 19 mg/dL (ref 5–40)

## 2016-09-10 MED ORDER — ATORVASTATIN CALCIUM 40 MG PO TABS: 40.0000 mg | ORAL_TABLET | Freq: Every day | ORAL | 1 refills | Status: DC

## 2016-11-18 ENCOUNTER — Other Ambulatory Visit: Payer: Self-pay | Admitting: Nurse Practitioner

## 2016-11-18 DIAGNOSIS — S39012A Strain of muscle, fascia and tendon of lower back, initial encounter: Secondary | ICD-10-CM

## 2016-11-30 ENCOUNTER — Other Ambulatory Visit: Payer: Self-pay | Admitting: Nurse Practitioner

## 2016-11-30 DIAGNOSIS — E039 Hypothyroidism, unspecified: Secondary | ICD-10-CM

## 2016-12-03 ENCOUNTER — Telehealth: Payer: Self-pay | Admitting: *Deleted

## 2016-12-03 MED ORDER — PREDNISONE 20 MG PO TABS
ORAL_TABLET | ORAL | 0 refills | Status: DC
Start: 2016-12-03 — End: 2017-03-28

## 2016-12-03 NOTE — Telephone Encounter (Signed)
Covering for PCP  Pt usually gets depo medrol injection. Prednisone would have similar onset of pain relief. I recommend getting evaluated tomorrow as planned.   Laroy Apple, MD [facility] 12/03/2016, 5:49 PM

## 2016-12-03 NOTE — Telephone Encounter (Signed)
Pt c/o excruciating back pain Has appt with [facility] [facility] in the AM but pain is unbearable Wanted to come in for inj Please advise

## 2016-12-03 NOTE — Telephone Encounter (Signed)
Pt notified of recommendation Verbalizes understnading

## 2016-12-04 DIAGNOSIS — M5441 Lumbago with sciatica, right side: Secondary | ICD-10-CM | POA: Diagnosis not present

## 2016-12-12 DIAGNOSIS — M5441 Lumbago with sciatica, right side: Secondary | ICD-10-CM | POA: Diagnosis not present

## 2017-01-14 ENCOUNTER — Other Ambulatory Visit: Payer: Self-pay | Admitting: Nurse Practitioner

## 2017-01-14 DIAGNOSIS — F411 Generalized anxiety disorder: Secondary | ICD-10-CM

## 2017-01-15 ENCOUNTER — Other Ambulatory Visit: Payer: Self-pay | Admitting: Nurse Practitioner

## 2017-01-15 DIAGNOSIS — F411 Generalized anxiety disorder: Secondary | ICD-10-CM

## 2017-01-15 NOTE — Telephone Encounter (Signed)
Refill called to [facility] 

## 2017-01-15 NOTE — Telephone Encounter (Signed)
Please call in lorazepam with 1 refills 

## 2017-01-20 DIAGNOSIS — M5441 Lumbago with sciatica, right side: Secondary | ICD-10-CM | POA: Diagnosis not present

## 2017-01-23 ENCOUNTER — Other Ambulatory Visit: Payer: Self-pay | Admitting: Nurse Practitioner

## 2017-01-23 DIAGNOSIS — S39012A Strain of muscle, fascia and tendon of lower back, initial encounter: Secondary | ICD-10-CM

## 2017-01-31 DIAGNOSIS — M5431 Sciatica, right side: Secondary | ICD-10-CM | POA: Diagnosis not present

## 2017-01-31 DIAGNOSIS — M6281 Muscle weakness (generalized): Secondary | ICD-10-CM | POA: Diagnosis not present

## 2017-01-31 DIAGNOSIS — R262 Difficulty in walking, not elsewhere classified: Secondary | ICD-10-CM | POA: Diagnosis not present

## 2017-02-03 DIAGNOSIS — M6281 Muscle weakness (generalized): Secondary | ICD-10-CM | POA: Diagnosis not present

## 2017-02-03 DIAGNOSIS — R262 Difficulty in walking, not elsewhere classified: Secondary | ICD-10-CM | POA: Diagnosis not present

## 2017-02-03 DIAGNOSIS — M5431 Sciatica, right side: Secondary | ICD-10-CM | POA: Diagnosis not present

## 2017-02-05 DIAGNOSIS — M5441 Lumbago with sciatica, right side: Secondary | ICD-10-CM | POA: Diagnosis not present

## 2017-02-07 DIAGNOSIS — M5431 Sciatica, right side: Secondary | ICD-10-CM | POA: Diagnosis not present

## 2017-02-07 DIAGNOSIS — M6281 Muscle weakness (generalized): Secondary | ICD-10-CM | POA: Diagnosis not present

## 2017-02-07 DIAGNOSIS — R262 Difficulty in walking, not elsewhere classified: Secondary | ICD-10-CM | POA: Diagnosis not present

## 2017-02-10 DIAGNOSIS — M6281 Muscle weakness (generalized): Secondary | ICD-10-CM | POA: Diagnosis not present

## 2017-02-10 DIAGNOSIS — R262 Difficulty in walking, not elsewhere classified: Secondary | ICD-10-CM | POA: Diagnosis not present

## 2017-02-10 DIAGNOSIS — M5431 Sciatica, right side: Secondary | ICD-10-CM | POA: Diagnosis not present

## 2017-02-14 DIAGNOSIS — M5431 Sciatica, right side: Secondary | ICD-10-CM | POA: Diagnosis not present

## 2017-02-14 DIAGNOSIS — M6281 Muscle weakness (generalized): Secondary | ICD-10-CM | POA: Diagnosis not present

## 2017-02-14 DIAGNOSIS — R262 Difficulty in walking, not elsewhere classified: Secondary | ICD-10-CM | POA: Diagnosis not present

## 2017-02-17 DIAGNOSIS — M6281 Muscle weakness (generalized): Secondary | ICD-10-CM | POA: Diagnosis not present

## 2017-02-17 DIAGNOSIS — M5431 Sciatica, right side: Secondary | ICD-10-CM | POA: Diagnosis not present

## 2017-02-17 DIAGNOSIS — R262 Difficulty in walking, not elsewhere classified: Secondary | ICD-10-CM | POA: Diagnosis not present

## 2017-02-21 DIAGNOSIS — M5431 Sciatica, right side: Secondary | ICD-10-CM | POA: Diagnosis not present

## 2017-02-21 DIAGNOSIS — R262 Difficulty in walking, not elsewhere classified: Secondary | ICD-10-CM | POA: Diagnosis not present

## 2017-02-21 DIAGNOSIS — M6281 Muscle weakness (generalized): Secondary | ICD-10-CM | POA: Diagnosis not present

## 2017-02-24 DIAGNOSIS — R262 Difficulty in walking, not elsewhere classified: Secondary | ICD-10-CM | POA: Diagnosis not present

## 2017-02-24 DIAGNOSIS — M6281 Muscle weakness (generalized): Secondary | ICD-10-CM | POA: Diagnosis not present

## 2017-02-24 DIAGNOSIS — M5431 Sciatica, right side: Secondary | ICD-10-CM | POA: Diagnosis not present

## 2017-03-03 DIAGNOSIS — M5431 Sciatica, right side: Secondary | ICD-10-CM | POA: Diagnosis not present

## 2017-03-03 DIAGNOSIS — M6281 Muscle weakness (generalized): Secondary | ICD-10-CM | POA: Diagnosis not present

## 2017-03-03 DIAGNOSIS — M5441 Lumbago with sciatica, right side: Secondary | ICD-10-CM | POA: Diagnosis not present

## 2017-03-03 DIAGNOSIS — R262 Difficulty in walking, not elsewhere classified: Secondary | ICD-10-CM | POA: Diagnosis not present

## 2017-03-07 DIAGNOSIS — M5431 Sciatica, right side: Secondary | ICD-10-CM | POA: Diagnosis not present

## 2017-03-07 DIAGNOSIS — M6281 Muscle weakness (generalized): Secondary | ICD-10-CM | POA: Diagnosis not present

## 2017-03-07 DIAGNOSIS — R262 Difficulty in walking, not elsewhere classified: Secondary | ICD-10-CM | POA: Diagnosis not present

## 2017-03-12 DIAGNOSIS — M48062 Spinal stenosis, lumbar region with neurogenic claudication: Secondary | ICD-10-CM | POA: Diagnosis not present

## 2017-03-14 DIAGNOSIS — M5431 Sciatica, right side: Secondary | ICD-10-CM | POA: Diagnosis not present

## 2017-03-14 DIAGNOSIS — M6281 Muscle weakness (generalized): Secondary | ICD-10-CM | POA: Diagnosis not present

## 2017-03-14 DIAGNOSIS — R262 Difficulty in walking, not elsewhere classified: Secondary | ICD-10-CM | POA: Diagnosis not present

## 2017-03-19 DIAGNOSIS — R262 Difficulty in walking, not elsewhere classified: Secondary | ICD-10-CM | POA: Diagnosis not present

## 2017-03-19 DIAGNOSIS — M6281 Muscle weakness (generalized): Secondary | ICD-10-CM | POA: Diagnosis not present

## 2017-03-19 DIAGNOSIS — M5431 Sciatica, right side: Secondary | ICD-10-CM | POA: Diagnosis not present

## 2017-03-26 DIAGNOSIS — M6281 Muscle weakness (generalized): Secondary | ICD-10-CM | POA: Diagnosis not present

## 2017-03-26 DIAGNOSIS — R262 Difficulty in walking, not elsewhere classified: Secondary | ICD-10-CM | POA: Diagnosis not present

## 2017-03-26 DIAGNOSIS — M5431 Sciatica, right side: Secondary | ICD-10-CM | POA: Diagnosis not present

## 2017-03-28 ENCOUNTER — Encounter: Payer: Self-pay | Admitting: Nurse Practitioner

## 2017-03-28 ENCOUNTER — Ambulatory Visit (INDEPENDENT_AMBULATORY_CARE_PROVIDER_SITE_OTHER): Payer: 59 | Admitting: Nurse Practitioner

## 2017-03-28 VITALS — BP 139/77 | HR 83 | Temp 97.5°F | Ht 66.0 in | Wt 220.0 lb

## 2017-03-28 DIAGNOSIS — K219 Gastro-esophageal reflux disease without esophagitis: Secondary | ICD-10-CM | POA: Diagnosis not present

## 2017-03-28 DIAGNOSIS — E039 Hypothyroidism, unspecified: Secondary | ICD-10-CM

## 2017-03-28 DIAGNOSIS — Z0000 Encounter for general adult medical examination without abnormal findings: Secondary | ICD-10-CM

## 2017-03-28 DIAGNOSIS — I10 Essential (primary) hypertension: Secondary | ICD-10-CM

## 2017-03-28 DIAGNOSIS — F411 Generalized anxiety disorder: Secondary | ICD-10-CM

## 2017-03-28 DIAGNOSIS — Z6834 Body mass index (BMI) 34.0-34.9, adult: Secondary | ICD-10-CM | POA: Diagnosis not present

## 2017-03-28 DIAGNOSIS — Z01419 Encounter for gynecological examination (general) (routine) without abnormal findings: Secondary | ICD-10-CM

## 2017-03-28 MED ORDER — LISINOPRIL 10 MG PO TABS: 10.0000 mg | ORAL_TABLET | Freq: Every day | ORAL | 3 refills | Status: DC

## 2017-03-28 MED ORDER — LORAZEPAM 0.5 MG PO TABS: 0.5000 mg | ORAL_TABLET | Freq: Two times a day (BID) | ORAL | 1 refills | Status: DC | PRN

## 2017-03-28 MED ORDER — OMEPRAZOLE 20 MG PO CPDR: DELAYED_RELEASE_CAPSULE | ORAL | 1 refills | Status: DC

## 2017-03-28 MED ORDER — LEVOTHYROXINE SODIUM 100 MCG PO TABS: ORAL_TABLET | ORAL | 1 refills | Status: DC

## 2017-03-28 NOTE — Progress Notes (Signed)
Subjective:    Patient ID: Kelly Lewis, female    DOB: Jun 30, 1951, 66 y.o.   MRN: [id number]  HPI  [name] is here today for complete physical exam, PAP and follow up of chronic medical problem.  Outpatient Encounter Prescriptions as of 03/28/2017  Medication Sig  . Cholecalciferol (VITAMIN D3) 2000 UNITS TABS Take by mouth.  . cyclobenzaprine (FLEXERIL) 10 MG tablet TAKE 1 TABLET (10 MG TOTAL) BY MOUTH 3 (THREE) TIMES DAILY AS NEEDED FOR MUSCLE SPASMS.  . cycloSPORINE (RESTASIS) 0.05 % ophthalmic emulsion Place 1 drop into both eyes 2 (two) times daily.  Marland Kitchen levothyroxine (SYNTHROID, LEVOTHROID) 100 MCG tablet TAKE 1 TABLET (100 MCG TOTAL) BY MOUTH DAILY.  Marland Kitchen lisinopril (PRINIVIL,ZESTRIL) 10 MG tablet Take 1 tablet (10 mg total) by mouth daily.  Marland Kitchen LORazepam (ATIVAN) 0.5 MG tablet TAKE 1 TABLET BY MOUTH TWICE A DAY AS NEEDED  . omeprazole (PRILOSEC) 20 MG capsule TAKE 1 TABLET (20 MG TOTAL) BY MOUTH DAILY.    1. Essential hypertension  No c/o chest pain, SOB or Headache. She does not check blood pressures at home  2. Gastroesophageal reflux disease without esophagitis  Currently on prilosec daily which works well for her  3. Hypothyroidism, unspecified type  No problems  4. GAD (generalized anxiety disorder)  On ativan BID- gets very anxious when she has no meds  5. BMI 34.0-34.9,adult  No recent weight changes    New complaints: none  Social history:     Review of Systems  Constitutional: Negative for activity change and appetite change.  HENT: Negative.   Eyes: Negative for pain.  Respiratory: Negative for shortness of breath.   Cardiovascular: Negative for chest pain, palpitations and leg swelling.  Gastrointestinal: Negative for abdominal pain.  Endocrine: Negative for polydipsia.  Genitourinary: Negative.   Skin: Negative for rash.  Neurological: Negative for dizziness, weakness and headaches.  Hematological: Does not bruise/bleed easily.    Psychiatric/Behavioral: Negative.   All other systems reviewed and are negative.      Objective:   Physical Exam  Constitutional: She is oriented to person, place, and time. She appears well-developed and well-nourished.  HENT:  Head: Normocephalic.  Right Ear: Hearing, tympanic membrane, external ear and ear canal normal.  Left Ear: Hearing, tympanic membrane, external ear and ear canal normal.  Nose: Nose normal.  Mouth/Throat: Uvula is midline and oropharynx is clear and moist.  Eyes: Pupils are equal, round, and reactive to light. Conjunctivae and EOM are normal.  Neck: Normal range of motion and full passive range of motion without pain. Neck supple. No JVD present. Carotid bruit is not present. No thyroid mass and no thyromegaly present.  Cardiovascular: Normal rate, normal heart sounds and intact distal pulses.   No murmur heard. Pulmonary/Chest: Effort normal and breath sounds normal. Right breast exhibits no inverted nipple, no mass, no nipple discharge, no skin change and no tenderness. Left breast exhibits no inverted nipple, no mass, no nipple discharge, no skin change and no tenderness.  Abdominal: Soft. Bowel sounds are normal. She exhibits no mass. There is no tenderness.  Genitourinary: Vagina normal and uterus normal. No breast swelling, tenderness, discharge or bleeding. No vaginal discharge found.  Genitourinary Comments: bimanual exam-No adnexal masses or tenderness. Cervix parous and pink  Musculoskeletal: Normal range of motion.  Lymphadenopathy:    She has no cervical adenopathy.  Neurological: She is alert and oriented to person, place, and time.  Skin: Skin is warm and dry.  Psychiatric: She has a normal mood and affect. Her behavior is normal. Judgment and thought content normal.   BP 139/77   Pulse 83   Temp (!) 97.5 F (36.4 C) (Oral)   Ht '5\' 6"'$  (1.676 m)   Wt 220 lb (99.8 kg)   BMI 35.51 kg/m       Assessment & Plan:  1. Annual physical exam -  CBC with Differential/Platelet  2. Gynecologic exam normal - Pap IG (Image Guided)  3. Essential hypertension Low sodium diet - CMP14+EGFR - Lipid panel - lisinopril (PRINIVIL,ZESTRIL) 10 MG tablet; Take 1 tablet (10 mg total) by mouth daily.  Dispense: 90 tablet; Refill: 3  4. Gastroesophageal reflux disease without esophagitis Avoid spicy foods Do not eat 2 hours prior to bedtime - omeprazole (PRILOSEC) 20 MG capsule; TAKE 1 TABLET (20 MG TOTAL) BY MOUTH DAILY.  Dispense: 90 capsule; Refill: 1  5. Hypothyroidism, unspecified type - Thyroid Panel With TSH- levothyroxine (SYNTHROID, LEVOTHROID) 100 MCG tablet; TAKE 1 TABLET (100 MCG TOTAL) BY MOUTH DAILY.  Dispense: 90 tablet; Refill: 1   6. GAD (generalized anxiety disorder) stress management - LORazepam (ATIVAN) 0.5 MG tablet; Take 1 tablet (0.5 mg total) by mouth 2 (two) times daily as needed.  Dispense: 60 tablet; Refill: 1  7. BMI 34.0-34.9,adult Discussed diet and exercise for person with BMI >25 Will recheck weight in 3-6 months      Labs pending Health maintenance reviewed Diet and exercise encouraged Continue all meds Follow up  In 6 months   [place], FNP

## 2017-03-28 NOTE — Patient Instructions (Signed)

## 2017-03-29 LAB — CMP14+EGFR
ALBUMIN: 4.3 g/dL (ref 3.6–4.8)
ALK PHOS: 147 IU/L — AB (ref 39–117)
ALT: 24 IU/L (ref 0–32)
AST: 30 IU/L (ref 0–40)
Albumin/Globulin Ratio: 1.6 (ref 1.2–2.2)
BUN / CREAT RATIO: 9 — AB (ref 12–28)
BUN: 8 mg/dL (ref 8–27)
Bilirubin Total: 0.5 mg/dL (ref 0.0–1.2)
CHLORIDE: 101 mmol/L (ref 96–106)
CO2: 24 mmol/L (ref 20–29)
CREATININE: 0.86 mg/dL (ref 0.57–1.00)
Calcium: 9.7 mg/dL (ref 8.7–10.3)
GFR calc non Af Amer: 71 mL/min/{1.73_m2} (ref >59)
GFR, EST AFRICAN AMERICAN: 81 mL/min/{1.73_m2} (ref >59)
GLUCOSE: 98 mg/dL (ref 65–99)
Globulin, Total: 2.7 g/dL (ref 1.5–4.5)
Potassium: 4.7 mmol/L (ref 3.5–5.2)
Sodium: 138 mmol/L (ref 134–144)
TOTAL PROTEIN: 7 g/dL (ref 6.0–8.5)

## 2017-03-29 LAB — THYROID PANEL WITH TSH
FREE THYROXINE INDEX: 3.2 (ref 1.2–4.9)
T3 Uptake Ratio: 24 % (ref 24–39)
T4, Total: 13.2 ug/dL — ABNORMAL HIGH (ref 4.5–12.0)
TSH: 0.91 u[IU]/mL (ref 0.450–4.500)

## 2017-03-29 LAB — CBC WITH DIFFERENTIAL/PLATELET
BASOS ABS: 0 10*3/uL (ref 0.0–0.2)
Basos: 0 %
EOS (ABSOLUTE): 0 10*3/uL (ref 0.0–0.4)
Eos: 1 %
HEMOGLOBIN: 13 g/dL (ref 11.1–15.9)
Hematocrit: 40.2 % (ref 34.0–46.6)
IMMATURE GRANS (ABS): 0 10*3/uL (ref 0.0–0.1)
Immature Granulocytes: 0 %
LYMPHS: 34 %
Lymphocytes Absolute: 1.4 10*3/uL (ref 0.7–3.1)
MCH: 30 pg (ref 26.6–33.0)
MCHC: 32.3 g/dL (ref 31.5–35.7)
MCV: 93 fL (ref 79–97)
MONOCYTES: 7 %
Monocytes Absolute: 0.3 10*3/uL (ref 0.1–0.9)
NEUTROS PCT: 58 %
Neutrophils Absolute: 2.4 10*3/uL (ref 1.4–7.0)
PLATELETS: 137 10*3/uL — AB (ref 150–379)
RBC: 4.34 x10E6/uL (ref 3.77–5.28)
RDW: 13.4 % (ref 12.3–15.4)
WBC: 4.1 10*3/uL (ref 3.4–10.8)

## 2017-03-29 LAB — LIPID PANEL
CHOLESTEROL TOTAL: 210 mg/dL — AB (ref 100–199)
Chol/HDL Ratio: 4 ratio (ref 0.0–4.4)
HDL: 52 mg/dL (ref >39)
LDL CALC: 135 mg/dL — AB (ref 0–99)
Triglycerides: 115 mg/dL (ref 0–149)
VLDL CHOLESTEROL CAL: 23 mg/dL (ref 5–40)

## 2017-04-01 LAB — PAP IG (IMAGE GUIDED): PAP SMEAR COMMENT: 0

## 2017-04-09 DIAGNOSIS — M6281 Muscle weakness (generalized): Secondary | ICD-10-CM | POA: Diagnosis not present

## 2017-04-09 DIAGNOSIS — R262 Difficulty in walking, not elsewhere classified: Secondary | ICD-10-CM | POA: Diagnosis not present

## 2017-04-09 DIAGNOSIS — M5431 Sciatica, right side: Secondary | ICD-10-CM | POA: Diagnosis not present

## 2017-04-15 DIAGNOSIS — M48062 Spinal stenosis, lumbar region with neurogenic claudication: Secondary | ICD-10-CM | POA: Diagnosis not present

## 2017-05-26 ENCOUNTER — Other Ambulatory Visit: Payer: Self-pay | Admitting: Nurse Practitioner

## 2017-05-26 DIAGNOSIS — E039 Hypothyroidism, unspecified: Secondary | ICD-10-CM

## 2017-05-30 ENCOUNTER — Telehealth: Payer: Self-pay | Admitting: Nurse Practitioner

## 2017-05-30 DIAGNOSIS — E039 Hypothyroidism, unspecified: Secondary | ICD-10-CM

## 2017-05-30 MED ORDER — LEVOTHYROXINE SODIUM 100 MCG PO TABS
ORAL_TABLET | ORAL | 1 refills | Status: DC
Start: 2017-05-30 — End: 2017-10-27

## 2017-05-30 NOTE — Telephone Encounter (Signed)
Refill sent into pharmacy but she will check with them on her July refill, but this one will also give her per protocol close to her one year refill

## 2017-05-30 NOTE — Telephone Encounter (Signed)
What is the name of the medication? LEVOTHYROXIN  Have you contacted your pharmacy to request a refill? Yes, was told she could not get a refill was seen in July by MMM for CPE  Which pharmacy would you like this sent to? [facility]   Patient notified that their request is being sent to the clinical staff for review and that they should receive a call once it is complete. If they do not receive a call within 24 hours they can check with their pharmacy or our office.

## 2017-06-06 ENCOUNTER — Other Ambulatory Visit: Payer: Self-pay | Admitting: Nurse Practitioner

## 2017-06-06 DIAGNOSIS — S39012A Strain of muscle, fascia and tendon of lower back, initial encounter: Secondary | ICD-10-CM

## 2017-07-04 ENCOUNTER — Other Ambulatory Visit: Payer: Self-pay | Admitting: Nurse Practitioner

## 2017-07-04 DIAGNOSIS — S39012A Strain of muscle, fascia and tendon of lower back, initial encounter: Secondary | ICD-10-CM

## 2017-07-15 DIAGNOSIS — M48062 Spinal stenosis, lumbar region with neurogenic claudication: Secondary | ICD-10-CM | POA: Diagnosis not present

## 2017-07-21 ENCOUNTER — Other Ambulatory Visit: Payer: Self-pay | Admitting: Pediatrics

## 2017-07-21 DIAGNOSIS — F411 Generalized anxiety disorder: Secondary | ICD-10-CM

## 2017-07-21 NOTE — Telephone Encounter (Signed)
Please call in lorazepam with 1 refills 

## 2017-07-21 NOTE — Telephone Encounter (Signed)
Rx called to pharmacy

## 2017-07-21 NOTE — Telephone Encounter (Signed)
Last seen 03/28/17  MMM  If approved route to [facility]

## 2017-09-08 DIAGNOSIS — Z1231 Encounter for screening mammogram for malignant neoplasm of breast: Secondary | ICD-10-CM | POA: Diagnosis not present

## 2017-09-08 LAB — HM MAMMOGRAPHY

## 2017-09-22 ENCOUNTER — Other Ambulatory Visit: Payer: Self-pay | Admitting: Nurse Practitioner

## 2017-09-22 DIAGNOSIS — K219 Gastro-esophageal reflux disease without esophagitis: Secondary | ICD-10-CM

## 2017-10-10 ENCOUNTER — Other Ambulatory Visit: Payer: Self-pay | Admitting: Nurse Practitioner

## 2017-10-10 DIAGNOSIS — S39012A Strain of muscle, fascia and tendon of lower back, initial encounter: Secondary | ICD-10-CM

## 2017-10-27 ENCOUNTER — Encounter: Payer: Self-pay | Admitting: Nurse Practitioner

## 2017-10-27 ENCOUNTER — Ambulatory Visit: Payer: Medicare Other | Admitting: Nurse Practitioner

## 2017-10-27 ENCOUNTER — Other Ambulatory Visit: Payer: Self-pay | Admitting: Nurse Practitioner

## 2017-10-27 VITALS — BP 132/85 | HR 92 | Temp 98.2°F | Ht 66.0 in | Wt 218.0 lb

## 2017-10-27 DIAGNOSIS — I10 Essential (primary) hypertension: Secondary | ICD-10-CM

## 2017-10-27 DIAGNOSIS — K219 Gastro-esophageal reflux disease without esophagitis: Secondary | ICD-10-CM | POA: Diagnosis not present

## 2017-10-27 DIAGNOSIS — E039 Hypothyroidism, unspecified: Secondary | ICD-10-CM | POA: Diagnosis not present

## 2017-10-27 DIAGNOSIS — Z6834 Body mass index (BMI) 34.0-34.9, adult: Secondary | ICD-10-CM | POA: Diagnosis not present

## 2017-10-27 DIAGNOSIS — F411 Generalized anxiety disorder: Secondary | ICD-10-CM

## 2017-10-27 DIAGNOSIS — Z23 Encounter for immunization: Secondary | ICD-10-CM

## 2017-10-27 MED ORDER — LISINOPRIL 10 MG PO TABS: 10.0000 mg | ORAL_TABLET | Freq: Every day | ORAL | 1 refills | Status: DC

## 2017-10-27 MED ORDER — OMEPRAZOLE 20 MG PO CPDR: DELAYED_RELEASE_CAPSULE | ORAL | 1 refills | Status: DC

## 2017-10-27 MED ORDER — LORAZEPAM 0.5 MG PO TABS: ORAL_TABLET | ORAL | 1 refills | Status: DC

## 2017-10-27 MED ORDER — LEVOTHYROXINE SODIUM 100 MCG PO TABS: ORAL_TABLET | ORAL | 1 refills | Status: DC

## 2017-10-27 NOTE — Telephone Encounter (Signed)
appt made

## 2017-10-27 NOTE — Patient Instructions (Signed)

## 2017-10-27 NOTE — Progress Notes (Signed)
Subjective:    Patient ID: Kelly Lewis, female    DOB: 30-Dec-1950, 67 y.o.   MRN: [id number]  HPI   Kelly Lewis is here today for follow up of chronic medical problem.  Outpatient Encounter Medications as of 10/27/2017  Medication Sig  . Cholecalciferol (VITAMIN D3) 2000 UNITS TABS Take by mouth.  . cyclobenzaprine (FLEXERIL) 10 MG tablet TAKE 1 TABLET BY MOUTH THREE TIMES A DAY AS NEEDED FOR MUSCLE SPASMS  . cycloSPORINE (RESTASIS) 0.05 % ophthalmic emulsion Place 1 drop into both eyes 2 (two) times daily.  Marland Kitchen ibuprofen (ADVIL,MOTRIN) 200 MG tablet Take 200 mg by mouth every 6 (six) hours as needed.  Marland Kitchen levothyroxine (SYNTHROID, LEVOTHROID) 100 MCG tablet TAKE 1 TABLET (100 MCG TOTAL) BY MOUTH DAILY.  Marland Kitchen lisinopril (PRINIVIL,ZESTRIL) 10 MG tablet Take 1 tablet (10 mg total) by mouth daily.  Marland Kitchen LORazepam (ATIVAN) 0.5 MG tablet TAKE 1 TABLET 2 TIMES A DAY AS NEEDED  . omeprazole (PRILOSEC) 20 MG capsule TAKE 1 CAPSULE BY MOUTH EVERY DAY     1. Essential hypertension  No c/o chest pain, sob or headache. Does not check blood pressure at home. BP Readings from Last 3 Encounters:  10/27/17 132/85  03/28/17 139/77  09/09/16 137/78    2. Gastroesophageal reflux disease without esophagitis  Takes prilosec daly to prevent symptoms  3. Hypothyroidism, unspecified type  No problems that she is aware  4. BMI 34.0-34.9,adult  No recent weight change  5. GAD (generalized anxiety disorder)  Takes ativan BID    New complaints: None today  Social history: Retired from [employer]  Constitutional: Negative for activity change and appetite change.  HENT: Negative.   Eyes: Negative for pain.  Respiratory: Negative for shortness of breath.   Cardiovascular: Negative for chest pain, palpitations and leg swelling.  Gastrointestinal: Negative for abdominal pain.  Endocrine: Negative for polydipsia.  Genitourinary: Negative.   Skin: Negative for rash.    Neurological: Negative for dizziness, weakness and headaches.  Hematological: Does not bruise/bleed easily.  Psychiatric/Behavioral: Negative.   All other systems reviewed and are negative.      Objective:   Physical Exam  Constitutional: She is oriented to person, place, and time. She appears well-developed and well-nourished.  HENT:  Nose: Nose normal.  Mouth/Throat: Oropharynx is clear and moist.  Eyes: EOM are normal.  Neck: Trachea normal, normal range of motion and full passive range of motion without pain. Neck supple. No JVD present. Carotid bruit is not present. No thyromegaly present.  Cardiovascular: Normal rate, regular rhythm, normal heart sounds and intact distal pulses. Exam reveals no gallop and no friction rub.  No murmur heard. Pulmonary/Chest: Effort normal and breath sounds normal.  Abdominal: Soft. Bowel sounds are normal. She exhibits no distension and no mass. There is no tenderness.  Musculoskeletal: Normal range of motion.  Lymphadenopathy:    She has no cervical adenopathy.  Neurological: She is alert and oriented to person, place, and time. She has normal reflexes.  Skin: Skin is warm and dry.  Psychiatric: She has a normal mood and affect. Her behavior is normal. Judgment and thought content normal.   BP 132/85   Pulse 92   Temp 98.2 F (36.8 C) (Oral)   Ht _0  (1.676 m)   Wt 218 lb (98.9 kg)   BMI 35.19 kg/m       Assessment & Plan:  1. Essential hypertension Low sodium diet - lisinopril (  PRINIVIL,ZESTRIL) 10 MG tablet; Take 1 tablet (10 mg total) by mouth daily.  Dispense: 90 tablet; Refill: 1 - CMP14+EGFR - Lipid panel  2. Gastroesophageal reflux disease without esophagitis Avoid spicy foods Do not eat 2 hours prior to bedtime - omeprazole (PRILOSEC) 20 MG capsule; TAKE 1 CAPSULE BY MOUTH EVERY DAY  Dispense: 90 capsule; Refill: 1  3. BMI 34.0-34.9,adult Discussed diet and exercise for person with BMI >25 Will recheck weight in 3-6  months  4. GAD (generalized anxiety disorder) Stress management - LORazepam (ATIVAN) 0.5 MG tablet; TAKE 1 TABLET 2 TIMES A DAY AS NEEDED  Dispense: 60 tablet; Refill: 1  5. Acquired hypothyroidism - levothyroxine (SYNTHROID, LEVOTHROID) 100 MCG tablet; TAKE 1 TABLET (100 MCG TOTAL) BY MOUTH DAILY.  Dispense: 90 tablet; Refill: 1 - Thyroid Panel With TSH    Labs pending Health maintenance reviewed Diet and exercise encouraged Continue all meds Follow up  In 6 months   [place], FNP

## 2017-10-27 NOTE — Addendum Note (Signed)
Addended by: Rolena Infante on: 10/27/2017 04:34 PM   Modules accepted: Orders

## 2017-10-28 LAB — LIPID PANEL
CHOL/HDL RATIO: 3.7 ratio (ref 0.0–4.4)
Cholesterol, Total: 194 mg/dL (ref 100–199)
HDL: 53 mg/dL (ref >39)
LDL Calculated: 118 mg/dL — ABNORMAL HIGH (ref 0–99)
Triglycerides: 113 mg/dL (ref 0–149)
VLDL CHOLESTEROL CAL: 23 mg/dL (ref 5–40)

## 2017-10-28 LAB — CMP14+EGFR
ALBUMIN: 4.6 g/dL (ref 3.6–4.8)
ALT: 33 IU/L — ABNORMAL HIGH (ref 0–32)
AST: 38 IU/L (ref 0–40)
Albumin/Globulin Ratio: 1.6 (ref 1.2–2.2)
Alkaline Phosphatase: 188 IU/L — ABNORMAL HIGH (ref 39–117)
BUN / CREAT RATIO: 13 (ref 12–28)
BUN: 11 mg/dL (ref 8–27)
Bilirubin Total: 0.4 mg/dL (ref 0.0–1.2)
CALCIUM: 10.1 mg/dL (ref 8.7–10.3)
CO2: 21 mmol/L (ref 20–29)
CREATININE: 0.87 mg/dL (ref 0.57–1.00)
Chloride: 103 mmol/L (ref 96–106)
GFR, EST AFRICAN AMERICAN: 80 mL/min/{1.73_m2} (ref >59)
GFR, EST NON AFRICAN AMERICAN: 70 mL/min/{1.73_m2} (ref >59)
GLOBULIN, TOTAL: 2.9 g/dL (ref 1.5–4.5)
GLUCOSE: 92 mg/dL (ref 65–99)
Potassium: 4.3 mmol/L (ref 3.5–5.2)
SODIUM: 141 mmol/L (ref 134–144)
TOTAL PROTEIN: 7.5 g/dL (ref 6.0–8.5)

## 2017-10-28 LAB — THYROID PANEL WITH TSH
Free Thyroxine Index: 2.4 (ref 1.2–4.9)
T3 UPTAKE RATIO: 20 % — AB (ref 24–39)
T4 TOTAL: 11.9 ug/dL (ref 4.5–12.0)
TSH: 2.11 u[IU]/mL (ref 0.450–4.500)

## 2018-01-19 ENCOUNTER — Other Ambulatory Visit: Payer: Self-pay | Admitting: Nurse Practitioner

## 2018-01-19 DIAGNOSIS — S39012A Strain of muscle, fascia and tendon of lower back, initial encounter: Secondary | ICD-10-CM

## 2018-01-19 NOTE — Telephone Encounter (Signed)
Last seen 10/27/17  MMM 

## 2018-02-03 ENCOUNTER — Other Ambulatory Visit: Payer: Self-pay | Admitting: Nurse Practitioner

## 2018-02-03 ENCOUNTER — Telehealth: Payer: Self-pay | Admitting: Nurse Practitioner

## 2018-02-03 DIAGNOSIS — F411 Generalized anxiety disorder: Secondary | ICD-10-CM

## 2018-02-03 NOTE — Telephone Encounter (Signed)
Does she have enough to last her until MM returns Tues?  If so, it would be better for her PCP to refill her controlled substance.  Otherwise, I will send in 7 days worth until she returns.  This would result in her paying 2 copays unfortunately.  Let me know what patient would like to do.

## 2018-02-03 NOTE — Telephone Encounter (Signed)
Pt states she will have enough until MMM returns Routed message to [facility]

## 2018-02-03 NOTE — Telephone Encounter (Signed)
What is the name of the medication? LORazepam (ATIVAN) 0.5 MG tablet [Pharmacy Med Name: LORAZEPAM 0.5 MG TABLET]  Have you contacted your pharmacy to request a refill? Yes needs appt/ pt needs an appt before the 15th of June because she leave to the beach. Call back to schedule with MMM.  Which pharmacy would you like this sent to? [facility]   Patient notified that their request is being sent to the clinical staff for review and that they should receive a call once it is complete. If they do not receive a call within 24 hours they can check with their pharmacy or our office.

## 2018-02-03 NOTE — Telephone Encounter (Signed)
Will defer to PCP.  Controlled substance needs OV.  Declined.

## 2018-02-03 NOTE — Telephone Encounter (Signed)
Last seen 10/27/17  MMM 

## 2018-02-03 NOTE — Telephone Encounter (Signed)
Please advise MMM pt Last seen 10/27/2017, told to follow up in 6 mths

## 2018-02-09 NOTE — Telephone Encounter (Signed)
Pt wants to know will mmm refill LORazepam (ATIVAN) 0.5 MG tablet or does she need an appt to be seen for the refill. She will need the appt or refill by Friday because she is leaving for a week. Please call back

## 2018-02-10 ENCOUNTER — Other Ambulatory Visit: Payer: Self-pay | Admitting: Nurse Practitioner

## 2018-02-10 DIAGNOSIS — F411 Generalized anxiety disorder: Secondary | ICD-10-CM

## 2018-02-10 MED ORDER — LORAZEPAM 0.5 MG PO TABS: ORAL_TABLET | ORAL | 1 refills | Status: DC

## 2018-04-01 ENCOUNTER — Other Ambulatory Visit: Payer: Self-pay | Admitting: Nurse Practitioner

## 2018-04-01 DIAGNOSIS — E039 Hypothyroidism, unspecified: Secondary | ICD-10-CM

## 2018-05-22 ENCOUNTER — Encounter: Payer: Self-pay | Admitting: Nurse Practitioner

## 2018-05-22 ENCOUNTER — Ambulatory Visit: Payer: Medicare Other | Admitting: Nurse Practitioner

## 2018-05-22 VITALS — BP 149/87 | HR 85 | Temp 98.2°F | Ht 66.0 in | Wt 222.0 lb

## 2018-05-22 DIAGNOSIS — K219 Gastro-esophageal reflux disease without esophagitis: Secondary | ICD-10-CM | POA: Diagnosis not present

## 2018-05-22 DIAGNOSIS — I10 Essential (primary) hypertension: Secondary | ICD-10-CM | POA: Diagnosis not present

## 2018-05-22 DIAGNOSIS — E039 Hypothyroidism, unspecified: Secondary | ICD-10-CM

## 2018-05-22 DIAGNOSIS — F411 Generalized anxiety disorder: Secondary | ICD-10-CM | POA: Diagnosis not present

## 2018-05-22 DIAGNOSIS — Z6834 Body mass index (BMI) 34.0-34.9, adult: Secondary | ICD-10-CM

## 2018-05-22 MED ORDER — LEVOTHYROXINE SODIUM 100 MCG PO TABS
ORAL_TABLET | ORAL | 1 refills | Status: DC
Start: 2018-05-22 — End: 2018-11-20

## 2018-05-22 MED ORDER — OMEPRAZOLE 20 MG PO CPDR: DELAYED_RELEASE_CAPSULE | ORAL | 1 refills | Status: DC

## 2018-05-22 MED ORDER — LISINOPRIL 10 MG PO TABS: 10.0000 mg | ORAL_TABLET | Freq: Every day | ORAL | 1 refills | Status: DC

## 2018-05-22 MED ORDER — LORAZEPAM 0.5 MG PO TABS: ORAL_TABLET | ORAL | 1 refills | Status: DC

## 2018-05-22 NOTE — Progress Notes (Signed)
Subjective:    Patient ID: Kelly Lewis, female    DOB: 1951-08-01, 67 y.o.   MRN: [id number]   Chief Complaint: Medical Management of Chronic Issues   HPI:  1. Essential hypertension  No c/o chest pain, sob or headache. Does not check blood pressure at home. BP Readings from Last 3 Encounters:  05/22/18 (!) 149/87  10/27/17 132/85  03/28/17 139/77     2. Gastroesophageal reflux disease without esophagitis  Takes omeprazole daily, denies any gerd symptoms when on meds.  3. Hypothyroidism, unspecified type  Not having any problems that she is aware of.  4. GAD (generalized anxiety disorder)  Is on ativan BID. She does not always take every 2x a day. Only takes 2x a day when she really needs it.  5. BMI 34.0-34.9,adult  No recent weight changes.   * sees pain management for back pain  Outpatient Encounter Medications as of 05/22/2018  Medication Sig  . Cholecalciferol (VITAMIN D3) 2000 UNITS TABS Take by mouth.  . cyclobenzaprine (FLEXERIL) 10 MG tablet TAKE 1 TABLET BY MOUTH THREE TIMES A DAY AS NEEDED FOR MUSCLE SPASMS  . cycloSPORINE (RESTASIS) 0.05 % ophthalmic emulsion Place 1 drop into both eyes 2 (two) times daily.  Marland Kitchen ibuprofen (ADVIL,MOTRIN) 200 MG tablet Take 200 mg by mouth every 6 (six) hours as needed.  Marland Kitchen levothyroxine (SYNTHROID, LEVOTHROID) 100 MCG tablet TAKE 1 TABLET BY MOUTH EVERY DAY  . lisinopril (PRINIVIL,ZESTRIL) 10 MG tablet Take 1 tablet (10 mg total) by mouth daily.  Marland Kitchen LORazepam (ATIVAN) 0.5 MG tablet TAKE 1 TABLET 2 TIMES A DAY AS NEEDED  . omeprazole (PRILOSEC) 20 MG capsule TAKE 1 CAPSULE BY MOUTH EVERY DAY     New complaints: None today  Social history: Retired from [employer]  Constitutional: Negative for activity change and appetite change.  HENT: Negative.   Eyes: Negative for pain.  Respiratory: Negative for shortness of breath.   Cardiovascular: Negative for chest pain, palpitations and leg  swelling.  Gastrointestinal: Negative for abdominal pain.  Endocrine: Negative for polydipsia.  Genitourinary: Negative.   Skin: Negative for rash.  Neurological: Negative for dizziness, weakness and headaches.  Hematological: Does not bruise/bleed easily.  Psychiatric/Behavioral: Negative.   All other systems reviewed and are negative.      Objective:   Physical Exam  Constitutional: She is oriented to person, place, and time. She appears well-developed and well-nourished. No distress.  HENT:  Head: Normocephalic.  Nose: Nose normal.  Mouth/Throat: Oropharynx is clear and moist.  Eyes: Pupils are equal, round, and reactive to light. EOM are normal.  Neck: Normal range of motion. Neck supple. No JVD present. Carotid bruit is not present.  Cardiovascular: Normal rate, regular rhythm, normal heart sounds and intact distal pulses.  Pulmonary/Chest: Effort normal and breath sounds normal. No respiratory distress. She has no wheezes. She has no rales. She exhibits no tenderness.  Abdominal: Soft. Normal appearance, normal aorta and bowel sounds are normal. She exhibits no distension, no abdominal bruit, no pulsatile midline mass and no mass. There is no splenomegaly or hepatomegaly. There is no tenderness.  Musculoskeletal: Normal range of motion. She exhibits no edema.  Lymphadenopathy:    She has no cervical adenopathy.  Neurological: She is alert and oriented to person, place, and time. She has normal reflexes.  Skin: Skin is warm and dry.  Psychiatric: She has a normal mood and affect. Her behavior is normal. Judgment and thought content  normal.  Nursing note and vitals reviewed.  BP (!) 149/87   Pulse 85   Temp 98.2 F (36.8 C) (Oral)   Ht '5\' 6"'$  (1.676 m)   Wt 222 lb (100.7 kg)   BMI 35.83 kg/m       Assessment & Plan:  Kelly Lewis comes in today with chief complaint of Medical Management of Chronic Issues   Diagnosis and orders addressed:  1. Essential  hypertension Low sodium - lisinopril (PRINIVIL,ZESTRIL) 10 MG tablet; Take 1 tablet (10 mg total) by mouth daily.  Dispense: 90 tablet; Refill: 1 - CMP14+EGFR - Lipid panel  2. Gastroesophageal reflux disease without esophagitis Avoid spicy foods Do not eat 2 hours prior to bedtime - omeprazole (PRILOSEC) 20 MG capsule; TAKE 1 CAPSULE BY MOUTH EVERY DAY  Dispense: 90 capsule; Refill: 1  3. GAD (generalized anxiety disorder) Stress management - LORazepam (ATIVAN) 0.5 MG tablet; TAKE 1 TABLET 2 TIMES A DAY AS NEEDED  Dispense: 60 tablet; Refill: 1  4. BMI 34.0-34.9,adult Discussed diet and exercise for person with BMI >25 Will recheck weight in 3-6 months  5. Acquired hypothyroidism - levothyroxine (SYNTHROID, LEVOTHROID) 100 MCG tablet; TAKE 1 TABLET BY MOUTH EVERY DAY  Dispense: 90 tablet; Refill: 1 - Thyroid Panel With TSH   Labs pending Health Maintenance reviewed Diet and exercise encouraged  Follow up plan: 6 months   Gerber, FNP

## 2018-05-22 NOTE — Patient Instructions (Signed)
DASH Eating Plan DASH stands for "Dietary Approaches to Stop Hypertension." The DASH eating plan is a healthy eating plan that has been shown to reduce high blood pressure (hypertension). It may also reduce your risk for type 2 diabetes, heart disease, and stroke. The DASH eating plan may also help with weight loss. What are tips for following this plan? General guidelines  Avoid eating more than 2,300 mg (milligrams) of salt (sodium) a day. If you have hypertension, you may need to reduce your sodium intake to 1,500 mg a day.  Limit alcohol intake to no more than 1 drink a day for nonpregnant women and 2 drinks a day for men. One drink equals 12 oz of beer, 5 oz of wine, or 1 oz of hard liquor.  Work with your health care provider to maintain a healthy body weight or to lose weight. Ask what an ideal weight is for you.  Get at least 30 minutes of exercise that causes your heart to beat faster (aerobic exercise) most days of the week. Activities may include walking, swimming, or biking.  Work with your health care provider or diet and nutrition specialist (dietitian) to adjust your eating plan to your individual calorie needs. Reading food labels  Check food labels for the amount of sodium per serving. Choose foods with less than 5 percent of the Daily Value of sodium. Generally, foods with less than 300 mg of sodium per serving fit into this eating plan.  To find whole grains, look for the word "whole" as the first word in the ingredient list. Shopping  Buy products labeled as "low-sodium" or "no salt added."  Buy fresh foods. Avoid canned foods and premade or frozen meals. Cooking  Avoid adding salt when cooking. Use salt-free seasonings or herbs instead of table salt or sea salt. Check with your health care provider or pharmacist before using salt substitutes.  Do not fry foods. Cook foods using healthy methods such as baking, boiling, grilling, and broiling instead.  Cook with  heart-healthy oils, such as olive, canola, soybean, or sunflower oil. Meal planning   Eat a balanced diet that includes: ? 5 or more servings of fruits and vegetables each day. At each meal, try to fill half of your plate with fruits and vegetables. ? Up to 6-8 servings of whole grains each day. ? Less than 6 oz of lean meat, poultry, or fish each day. A 3-oz serving of meat is about the same size as a deck of cards. One egg equals 1 oz. ? 2 servings of low-fat dairy each day. ? A serving of nuts, seeds, or beans 5 times each week. ? Heart-healthy fats. Healthy fats called Omega-3 fatty acids are found in foods such as flaxseeds and coldwater fish, like sardines, salmon, and mackerel.  Limit how much you eat of the following: ? Canned or prepackaged foods. ? Food that is high in trans fat, such as fried foods. ? Food that is high in saturated fat, such as fatty meat. ? Sweets, desserts, sugary drinks, and other foods with added sugar. ? Full-fat dairy products.  Do not salt foods before eating.  Try to eat at least 2 vegetarian meals each week.  Eat more home-cooked food and less restaurant, buffet, and fast food.  When eating at a restaurant, ask that your food be prepared with less salt or no salt, if possible. What foods are recommended? The items listed may not be a complete list. Talk with your dietitian about what   dietary choices are best for you. Grains Whole-grain or whole-wheat bread. Whole-grain or whole-wheat pasta. Brown rice. Oatmeal. Quinoa. Bulgur. Whole-grain and low-sodium cereals. Pita bread. Low-fat, low-sodium crackers. Whole-wheat flour tortillas. Vegetables Fresh or frozen vegetables (raw, steamed, roasted, or grilled). Low-sodium or reduced-sodium tomato and vegetable juice. Low-sodium or reduced-sodium tomato sauce and tomato paste. Low-sodium or reduced-sodium canned vegetables. Fruits All fresh, dried, or frozen fruit. Canned fruit in natural juice (without  added sugar). Meat and other protein foods Skinless chicken or turkey. Ground chicken or turkey. Pork with fat trimmed off. Fish and seafood. Egg whites. Dried beans, peas, or lentils. Unsalted nuts, nut butters, and seeds. Unsalted canned beans. Lean cuts of beef with fat trimmed off. Low-sodium, lean deli meat. Dairy Low-fat (1%) or fat-free (skim) milk. Fat-free, low-fat, or reduced-fat cheeses. Nonfat, low-sodium ricotta or cottage cheese. Low-fat or nonfat yogurt. Low-fat, low-sodium cheese. Fats and oils Soft margarine without trans fats. Vegetable oil. Low-fat, reduced-fat, or light mayonnaise and salad dressings (reduced-sodium). Canola, safflower, olive, soybean, and sunflower oils. Avocado. Seasoning and other foods Herbs. Spices. Seasoning mixes without salt. Unsalted popcorn and pretzels. Fat-free sweets. What foods are not recommended? The items listed may not be a complete list. Talk with your dietitian about what dietary choices are best for you. Grains Baked goods made with fat, such as croissants, muffins, or some breads. Dry pasta or rice meal packs. Vegetables Creamed or fried vegetables. Vegetables in a cheese sauce. Regular canned vegetables (not low-sodium or reduced-sodium). Regular canned tomato sauce and paste (not low-sodium or reduced-sodium). Regular tomato and vegetable juice (not low-sodium or reduced-sodium). Pickles. Olives. Fruits Canned fruit in a light or heavy syrup. Fried fruit. Fruit in cream or butter sauce. Meat and other protein foods Fatty cuts of meat. Ribs. Fried meat. Bacon. Sausage. Bologna and other processed lunch meats. Salami. Fatback. Hotdogs. Bratwurst. Salted nuts and seeds. Canned beans with added salt. Canned or smoked fish. Whole eggs or egg yolks. Chicken or turkey with skin. Dairy Whole or 2% milk, cream, and half-and-half. Whole or full-fat cream cheese. Whole-fat or sweetened yogurt. Full-fat cheese. Nondairy creamers. Whipped toppings.  Processed cheese and cheese spreads. Fats and oils Butter. Stick margarine. Lard. Shortening. Ghee. Bacon fat. Tropical oils, such as coconut, palm kernel, or palm oil. Seasoning and other foods Salted popcorn and pretzels. Onion salt, garlic salt, seasoned salt, table salt, and sea salt. Worcestershire sauce. Tartar sauce. Barbecue sauce. Teriyaki sauce. Soy sauce, including reduced-sodium. Steak sauce. Canned and packaged gravies. Fish sauce. Oyster sauce. Cocktail sauce. Horseradish that you find on the shelf. Ketchup. Mustard. Meat flavorings and tenderizers. Bouillon cubes. Hot sauce and Tabasco sauce. Premade or packaged marinades. Premade or packaged taco seasonings. Relishes. Regular salad dressings. Where to find more information:  National Heart, Lung, and Blood Institute: www.nhlbi.nih.gov  American Heart Association: www.heart.org Summary  The DASH eating plan is a healthy eating plan that has been shown to reduce high blood pressure (hypertension). It may also reduce your risk for type 2 diabetes, heart disease, and stroke.  With the DASH eating plan, you should limit salt (sodium) intake to 2,300 mg a day. If you have hypertension, you may need to reduce your sodium intake to 1,500 mg a day.  When on the DASH eating plan, aim to eat more fresh fruits and vegetables, whole grains, lean proteins, low-fat dairy, and heart-healthy fats.  Work with your health care provider or diet and nutrition specialist (dietitian) to adjust your eating plan to your individual   calorie needs. This information is not intended to replace advice given to you by your health care provider. Make sure you discuss any questions you have with your health care provider. Document Released: 08/08/2011 Document Revised: 08/12/2016 Document Reviewed: 08/12/2016 Elsevier Interactive Patient Education  2018 Elsevier Inc.  

## 2018-05-23 LAB — LIPID PANEL
CHOL/HDL RATIO: 3.8 ratio (ref 0.0–4.4)
CHOLESTEROL TOTAL: 203 mg/dL — AB (ref 100–199)
HDL: 53 mg/dL (ref >39)
LDL CALC: 131 mg/dL — AB (ref 0–99)
Triglycerides: 97 mg/dL (ref 0–149)
VLDL CHOLESTEROL CAL: 19 mg/dL (ref 5–40)

## 2018-05-23 LAB — CMP14+EGFR
ALK PHOS: 196 IU/L — AB (ref 39–117)
ALT: 33 IU/L — ABNORMAL HIGH (ref 0–32)
AST: 41 IU/L — ABNORMAL HIGH (ref 0–40)
Albumin/Globulin Ratio: 1.8 (ref 1.2–2.2)
Albumin: 4.6 g/dL (ref 3.6–4.8)
BUN / CREAT RATIO: 11 — AB (ref 12–28)
BUN: 10 mg/dL (ref 8–27)
Bilirubin Total: 0.5 mg/dL (ref 0.0–1.2)
CO2: 21 mmol/L (ref 20–29)
CREATININE: 0.91 mg/dL (ref 0.57–1.00)
Calcium: 10.1 mg/dL (ref 8.7–10.3)
Chloride: 102 mmol/L (ref 96–106)
GFR, EST AFRICAN AMERICAN: 76 mL/min/{1.73_m2} (ref >59)
GFR, EST NON AFRICAN AMERICAN: 65 mL/min/{1.73_m2} (ref >59)
GLOBULIN, TOTAL: 2.5 g/dL (ref 1.5–4.5)
GLUCOSE: 83 mg/dL (ref 65–99)
Potassium: 5 mmol/L (ref 3.5–5.2)
SODIUM: 140 mmol/L (ref 134–144)
TOTAL PROTEIN: 7.1 g/dL (ref 6.0–8.5)

## 2018-05-23 LAB — THYROID PANEL WITH TSH
FREE THYROXINE INDEX: 3.6 (ref 1.2–4.9)
T3 UPTAKE RATIO: 25 % (ref 24–39)
T4, Total: 14.3 ug/dL — ABNORMAL HIGH (ref 4.5–12.0)
TSH: 1.9 u[IU]/mL (ref 0.450–4.500)

## 2018-06-01 ENCOUNTER — Other Ambulatory Visit: Payer: Self-pay | Admitting: Nurse Practitioner

## 2018-06-01 DIAGNOSIS — S39012A Strain of muscle, fascia and tendon of lower back, initial encounter: Secondary | ICD-10-CM

## 2018-06-01 NOTE — Telephone Encounter (Signed)
Last seen 05/22/18  MMM 

## 2018-06-09 DIAGNOSIS — M5136 Other intervertebral disc degeneration, lumbar region: Secondary | ICD-10-CM | POA: Insufficient documentation

## 2018-08-24 ENCOUNTER — Other Ambulatory Visit: Payer: Self-pay | Admitting: Nurse Practitioner

## 2018-08-24 DIAGNOSIS — F411 Generalized anxiety disorder: Secondary | ICD-10-CM

## 2018-08-24 NOTE — Telephone Encounter (Signed)
Last seen 05/22/18  MMM

## 2018-09-14 LAB — HM MAMMOGRAPHY

## 2018-10-19 ENCOUNTER — Other Ambulatory Visit: Payer: Self-pay | Admitting: Nurse Practitioner

## 2018-10-19 DIAGNOSIS — F411 Generalized anxiety disorder: Secondary | ICD-10-CM

## 2018-11-20 ENCOUNTER — Other Ambulatory Visit: Payer: Self-pay

## 2018-11-20 ENCOUNTER — Encounter: Payer: Self-pay | Admitting: Nurse Practitioner

## 2018-11-20 ENCOUNTER — Ambulatory Visit: Payer: Medicare Other | Admitting: Nurse Practitioner

## 2018-11-20 ENCOUNTER — Other Ambulatory Visit: Payer: Self-pay | Admitting: Nurse Practitioner

## 2018-11-20 VITALS — BP 127/73 | HR 79 | Temp 97.2°F | Ht 66.0 in | Wt 211.0 lb

## 2018-11-20 DIAGNOSIS — K219 Gastro-esophageal reflux disease without esophagitis: Secondary | ICD-10-CM

## 2018-11-20 DIAGNOSIS — I10 Essential (primary) hypertension: Secondary | ICD-10-CM | POA: Diagnosis not present

## 2018-11-20 DIAGNOSIS — Z6834 Body mass index (BMI) 34.0-34.9, adult: Secondary | ICD-10-CM | POA: Diagnosis not present

## 2018-11-20 DIAGNOSIS — E039 Hypothyroidism, unspecified: Secondary | ICD-10-CM

## 2018-11-20 DIAGNOSIS — F411 Generalized anxiety disorder: Secondary | ICD-10-CM

## 2018-11-20 MED ORDER — LORAZEPAM 0.5 MG PO TABS: 0.5000 mg | ORAL_TABLET | Freq: Two times a day (BID) | ORAL | 2 refills | Status: DC | PRN

## 2018-11-20 MED ORDER — OMEPRAZOLE 20 MG PO CPDR: 20.0000 mg | DELAYED_RELEASE_CAPSULE | Freq: Every day | ORAL | 1 refills | Status: DC

## 2018-11-20 MED ORDER — LEVOTHYROXINE SODIUM 100 MCG PO TABS: ORAL_TABLET | ORAL | 1 refills | Status: DC

## 2018-11-20 MED ORDER — LISINOPRIL 10 MG PO TABS: 10.0000 mg | ORAL_TABLET | Freq: Every day | ORAL | 1 refills | Status: DC

## 2018-11-20 NOTE — Progress Notes (Signed)
Subjective:    Patient ID: Kelly Lewis, female    DOB: 11-05-1950, 68 y.o.   MRN: [id number]   Chief Complaint: medical management of chronic issues   HPI:  1. Essential hypertension  No c/o chest pain, sob or headache. Does not check blood pressure at home. BP Readings from Last 3 Encounters:  05/22/18 (!) 149/87  10/27/17 132/85  03/28/17 139/77     2. Gastroesophageal reflux disease without esophagitis  Takes omeprazole daily  3. Hypothyroidism, unspecified type  No problems that she is aware of. No fatigue, dry sin or hair loss.  4. GAD (generalized anxiety disorder)  On lorezapam bid, works well to keep her from feeling so anxious.  5. BMI 34.0-34.9,adult  Weight loss 11lbs    Outpatient Encounter Medications as of 11/20/2018  Medication Sig  . Cholecalciferol (VITAMIN D3) 2000 UNITS TABS Take by mouth.  . cyclobenzaprine (FLEXERIL) 10 MG tablet TAKE 1 TABLET BY MOUTH THREE TIMES A DAY AS NEEDED FOR MUSCLE SPASMS  . cycloSPORINE (RESTASIS) 0.05 % ophthalmic emulsion Place 1 drop into both eyes 2 (two) times daily.  Marland Kitchen ibuprofen (ADVIL,MOTRIN) 200 MG tablet Take 200 mg by mouth every 6 (six) hours as needed.  Marland Kitchen levothyroxine (SYNTHROID, LEVOTHROID) 100 MCG tablet TAKE 1 TABLET BY MOUTH EVERY DAY  . lisinopril (PRINIVIL,ZESTRIL) 10 MG tablet Take 1 tablet (10 mg total) by mouth daily.  Marland Kitchen LORazepam (ATIVAN) 0.5 MG tablet TAKE 1 TABLET BY MOUTH TWICE A DAY AS NEEDED  . omeprazole (PRILOSEC) 20 MG capsule TAKE 1 CAPSULE BY MOUTH EVERY DAY      New complaints: None today  Social history: Patient is retired from [employer]    Review of Systems  Constitutional: Negative for activity change and appetite change.  HENT: Negative.   Eyes: Negative for pain.  Respiratory: Negative for shortness of breath.   Cardiovascular: Negative for chest pain, palpitations and leg swelling.  Gastrointestinal: Negative for abdominal pain.  Endocrine: Negative  for polydipsia.  Genitourinary: Negative.   Skin: Negative for rash.  Neurological: Negative for dizziness, weakness and headaches.  Hematological: Does not bruise/bleed easily.  Psychiatric/Behavioral: Negative.   All other systems reviewed and are negative.      Objective:   Physical Exam Vitals signs and nursing note reviewed.  Constitutional:      General: She is not in acute distress.    Appearance: Normal appearance. She is well-developed.  HENT:     Head: Normocephalic.     Nose: Nose normal.  Eyes:     Pupils: Pupils are equal, round, and reactive to light.  Neck:     Musculoskeletal: Normal range of motion and neck supple.     Vascular: No carotid bruit or JVD.  Cardiovascular:     Rate and Rhythm: Normal rate and regular rhythm.     Heart sounds: Normal heart sounds.  Pulmonary:     Effort: Pulmonary effort is normal. No respiratory distress.     Breath sounds: Normal breath sounds. No wheezing or rales.  Chest:     Chest wall: No tenderness.  Abdominal:     General: Bowel sounds are normal. There is no distension or abdominal bruit.     Palpations: Abdomen is soft. There is no hepatomegaly, splenomegaly, mass or pulsatile mass.     Tenderness: There is no abdominal tenderness.  Musculoskeletal: Normal range of motion.  Lymphadenopathy:     Cervical: No cervical adenopathy.  Skin:  General: Skin is warm and dry.  Neurological:     Mental Status: She is alert and oriented to person, place, and time.     Deep Tendon Reflexes: Reflexes are normal and symmetric.  Psychiatric:        Behavior: Behavior normal.        Thought Content: Thought content normal.        Judgment: Judgment normal.    BP 127/73   Pulse 79   Temp (!) 97.2 F (36.2 C) (Oral)   Ht '5\' 6"'$  (1.676 m)   Wt 211 lb (95.7 kg)   BMI 34.06 kg/m        Assessment & Plan:  KELTY SZAFRAN comes in today with chief complaint of Medical Management of Chronic Issues   Diagnosis and orders  addressed:  1. Essential hypertension Low sodium diet - CMP14+EGFR - Lipid panel - lisinopril (PRINIVIL,ZESTRIL) 10 MG tablet; Take 1 tablet (10 mg total) by mouth daily.  Dispense: 90 tablet; Refill: 1  2. Gastroesophageal reflux disease without esophagitis Avoid spicy foods Do not eat 2 hours prior to bedtime - omeprazole (PRILOSEC) 20 MG capsule; Take 1 capsule (20 mg total) by mouth daily.  Dispense: 90 capsule; Refill: 1  3. GAD (generalized anxiety disorder) Stress management - LORazepam (ATIVAN) 0.5 MG tablet; Take 1 tablet (0.5 mg total) by mouth 2 (two) times daily as needed.  Dispense: 60 tablet; Refill: 2  4. BMI 34.0-34.9,adult Discussed diet and exercise for person with BMI >25 Will recheck weight in 3-6 months  5. Acquired hypothyroidism - Thyroid Panel With TSH - levothyroxine (SYNTHROID, LEVOTHROID) 100 MCG tablet; TAKE 1 TABLET BY MOUTH EVERY DAY  Dispense: 90 tablet; Refill: 1   Labs pending Health Maintenance reviewed Diet and exercise encouraged  Follow up plan: 6 months   Mary-Margaret Hassell Done, FNP

## 2018-11-20 NOTE — Patient Instructions (Signed)
Health Maintenance After Age 68 After age 68, you are at a higher risk for certain long-term diseases and infections as well as injuries from falls. Falls are a major cause of broken bones and head injuries in people who are older than age 68. Getting regular preventive care can help to keep you healthy and well. Preventive care includes getting regular testing and making lifestyle changes as recommended by your health care provider. Talk with your health care provider about:  Which screenings and tests you should have. A screening is a test that checks for a disease when you have no symptoms.  A diet and exercise plan that is right for you. What should I know about screenings and tests to prevent falls? Screening and testing are the best ways to find a health problem early. Early diagnosis and treatment give you the best chance of managing medical conditions that are common after age 68. Certain conditions and lifestyle choices may make you more likely to have a fall. Your health care provider may recommend:  Regular vision checks. Poor vision and conditions such as cataracts can make you more likely to have a fall. If you wear glasses, make sure to get your prescription updated if your vision changes.  Medicine review. Work with your health care provider to regularly review all of the medicines you are taking, including over-the-counter medicines. Ask your health care provider about any side effects that may make you more likely to have a fall. Tell your health care provider if any medicines that you take make you feel dizzy or sleepy.  Osteoporosis screening. Osteoporosis is a condition that causes the bones to get weaker. This can make the bones weak and cause them to break more easily.  Blood pressure screening. Blood pressure changes and medicines to control blood pressure can make you feel dizzy.  Strength and balance checks. Your health care provider may recommend certain tests to check your  strength and balance while standing, walking, or changing positions.  Foot health exam. Foot pain and numbness, as well as not wearing proper footwear, can make you more likely to have a fall.  Depression screening. You may be more likely to have a fall if you have a fear of falling, feel emotionally low, or feel unable to do activities that you used to do.  Alcohol use screening. Using too much alcohol can affect your balance and may make you more likely to have a fall. What actions can I take to lower my risk of falls? General instructions  Talk with your health care provider about your risks for falling. Tell your health care provider if: ? You fall. Be sure to tell your health care provider about all falls, even ones that seem minor. ? You feel dizzy, sleepy, or off-balance.  Take over-the-counter and prescription medicines only as told by your health care provider. These include any supplements.  Eat a healthy diet and maintain a healthy weight. A healthy diet includes low-fat dairy products, low-fat (lean) meats, and fiber from whole grains, beans, and lots of fruits and vegetables. Home safety  Remove any tripping hazards, such as rugs, cords, and clutter.  Install safety equipment such as grab bars in bathrooms and safety rails on stairs.  Keep rooms and walkways well-lit. Activity   Follow a regular exercise program to stay fit. This will help you maintain your balance. Ask your health care provider what types of exercise are appropriate for you.  If you need a cane or   walker, use it as recommended by your health care provider.  Wear supportive shoes that have nonskid soles. Lifestyle  Do not drink alcohol if your health care provider tells you not to drink.  If you drink alcohol, limit how much you have: ? 0-1 drink a day for women. ? 0-2 drinks a day for men.  Be aware of how much alcohol is in your drink. In the [place] one drink equals one typical bottle of beer (12  oz), one-half glass of wine (5 oz), or one shot of hard liquor (1 oz).  Do not use any products that contain nicotine or tobacco, such as cigarettes and e-cigarettes. If you need help quitting, ask your health care provider. Summary  Having a healthy lifestyle and getting preventive care can help to protect your health and wellness after age 68.  Screening and testing are the best way to find a health problem early and help you avoid having a fall. Early diagnosis and treatment give you the best chance for managing medical conditions that are more common for people who are older than age 68.  Falls are a major cause of broken bones and head injuries in people who are older than age 68. Take precautions to prevent a fall at home.  Work with your health care provider to learn what changes you can make to improve your health and wellness and to prevent falls. This information is not intended to replace advice given to you by your health care provider. Make sure you discuss any questions you have with your health care provider. Document Released: 07/02/2017 Document Revised: 07/02/2017 Document Reviewed: 07/02/2017 Elsevier Interactive Patient Education  2019 [employer]  

## 2018-11-21 LAB — CMP14+EGFR
ALK PHOS: 184 IU/L — AB (ref 39–117)
ALT: 24 IU/L (ref 0–32)
AST: 33 IU/L (ref 0–40)
Albumin/Globulin Ratio: 1.9 (ref 1.2–2.2)
Albumin: 4.5 g/dL (ref 3.8–4.8)
BUN/Creatinine Ratio: 15 (ref 12–28)
BUN: 13 mg/dL (ref 8–27)
Bilirubin Total: 0.5 mg/dL (ref 0.0–1.2)
CO2: 22 mmol/L (ref 20–29)
CREATININE: 0.89 mg/dL (ref 0.57–1.00)
Calcium: 10 mg/dL (ref 8.7–10.3)
Chloride: 101 mmol/L (ref 96–106)
GFR calc Af Amer: 77 mL/min/{1.73_m2} (ref >59)
GFR calc non Af Amer: 67 mL/min/{1.73_m2} (ref >59)
GLOBULIN, TOTAL: 2.4 g/dL (ref 1.5–4.5)
GLUCOSE: 101 mg/dL — AB (ref 65–99)
Potassium: 4.6 mmol/L (ref 3.5–5.2)
SODIUM: 137 mmol/L (ref 134–144)
Total Protein: 6.9 g/dL (ref 6.0–8.5)

## 2018-11-21 LAB — THYROID PANEL WITH TSH
Free Thyroxine Index: 3.2 (ref 1.2–4.9)
T3 UPTAKE RATIO: 23 % — AB (ref 24–39)
T4, Total: 14 ug/dL — ABNORMAL HIGH (ref 4.5–12.0)
TSH: 1.41 u[IU]/mL (ref 0.450–4.500)

## 2018-11-21 LAB — LIPID PANEL
CHOL/HDL RATIO: 3.3 ratio (ref 0.0–4.4)
Cholesterol, Total: 181 mg/dL (ref 100–199)
HDL: 55 mg/dL (ref >39)
LDL Calculated: 109 mg/dL — ABNORMAL HIGH (ref 0–99)
TRIGLYCERIDES: 84 mg/dL (ref 0–149)
VLDL Cholesterol Cal: 17 mg/dL (ref 5–40)

## 2019-01-11 ENCOUNTER — Other Ambulatory Visit: Payer: Self-pay

## 2019-01-11 ENCOUNTER — Ambulatory Visit (INDEPENDENT_AMBULATORY_CARE_PROVIDER_SITE_OTHER): Payer: Medicare Other | Admitting: *Deleted

## 2019-01-11 VITALS — Ht 66.0 in | Wt 211.0 lb

## 2019-01-11 DIAGNOSIS — Z0000 Encounter for general adult medical examination without abnormal findings: Secondary | ICD-10-CM | POA: Diagnosis not present

## 2019-01-11 NOTE — Progress Notes (Addendum)
MEDICARE ANNUAL WELLNESS VISIT  01/11/2019  Telephone Visit Disclaimer This Medicare AWV was conducted by telephone due to national recommendations for restrictions regarding the COVID-19 Pandemic (e.g. social distancing).  I verified, using two identifiers, that I am speaking with Kelly Lewis or their authorized healthcare agent. I discussed the limitations, risks, security, and privacy concerns of performing an evaluation and management service by telephone and the potential availability of an in-person appointment in the future. The patient expressed understanding and agreed to proceed.   Subjective:  Kelly Lewis is a 68 y.o. female patient of Chevis Pretty, [place] who had a Medicare Annual Wellness Visit today via telephone. Kelly Lewis is Retired and lives with their spouse. she has 2 children. she reports that she is socially active and does interact with friends/family regularly. she is minimally physically active and enjoys shopping.  Patient Care Team: Chevis Pretty, FNP as PCP - General (Nurse Practitioner)  Advanced Directives 01/11/2019  Does Patient Have a Medical Advance Directive? Yes  Type of Paramedic of [place];Living will  Does patient want to make changes to medical advance directive? Yes (MAU/Ambulatory/Procedural Areas - Information given)  Copy of [place] in Chart? No - copy requested  Would patient like information on creating a medical advance directive? Yes (MAU/Ambulatory/Procedural Areas - Information given)    Hospital Utilization Over the Past 12 Months: # of hospitalizations or ER visits: 0 # of surgeries: 0  Review of Systems    Patient reports that her overall health is unchanged compared to last year.  Patient Reported Readings (BP, Pulse, CBG, Weight, etc) none  Review of Systems: History obtained from chart review and the patient General ROS: negative Allergy and Immunology ROS:  negative  All other systems negative.  Pain Assessment Pain : 0-10 Pain Score: 7  Pain Type: Chronic pain Pain Location: Knee Pain Orientation: Left, Right Pain Descriptors / Indicators: Aching, Burning, Constant, Stabbing Pain Onset: More than a month ago Pain Frequency: Constant Pain Relieving Factors: ibuprofen  Pain Relieving Factors: ibuprofen  Current Medications & Allergies (verified) Allergies as of 01/11/2019      Reactions   Ciprofloxacin Hcl Other (See Comments)   Abdominal pain, anxiety, facial rash   Naprosyn [naproxen]    gerd   Shingrix [zoster Vac Recomb Adjuvanted]    Family History of Reaction   Sulfonamide Derivatives    REACTION: skin turns red   Xylocaine [lidocaine Hcl]    Real red, hot and flushed all over      Medication List       Accurate as of Jan 11, 2019  8:56 AM. If you have any questions, ask your nurse or doctor.        cyclobenzaprine 10 MG tablet Commonly known as:  FLEXERIL TAKE 1 TABLET BY MOUTH THREE TIMES A DAY AS NEEDED FOR MUSCLE SPASMS   ibuprofen 200 MG tablet Commonly known as:  ADVIL Take 200 mg by mouth every 6 (six) hours as needed.   levothyroxine 100 MCG tablet Commonly known as:  SYNTHROID TAKE 1 TABLET BY MOUTH EVERY DAY   lisinopril 10 MG tablet Commonly known as:  ZESTRIL Take 1 tablet (10 mg total) by mouth daily.   LORazepam 0.5 MG tablet Commonly known as:  ATIVAN Take 1 tablet (0.5 mg total) by mouth 2 (two) times daily as needed.   omeprazole 20 MG capsule Commonly known as:  PRILOSEC Take 1 capsule (20 mg total) by mouth  daily.   Vitamin D3 50 MCG (2000 UT) Tabs Take by mouth.       History (reviewed): Past Medical History:  Diagnosis Date  . Chronic anxiety   . GERD (gastroesophageal reflux disease)   . H/O degenerative disc disease   . Hypertension   . Plantar fasciitis   . Thyroid disease    Past Surgical History:  Procedure Laterality Date  . bladder tacked     Family  History  Problem Relation Age of Onset  . Diabetes Mother   . Pneumonia Father   . Cancer Father        skin  . Cancer Sister        skin   Social History   Socioeconomic History  . Marital status: Married    Spouse name: Laverna Peace  . Number of children: 2  . Years of education: College  . Highest education level: Bachelor's degree (e.g., BA, AB, BS)  Occupational History  . Occupation: Retired   Scientific laboratory technician  . Financial resource strain: Not hard at all  . Food insecurity:    Worry: Never true    Inability: Never true  . Transportation needs:    Medical: No    Non-medical: No  Tobacco Use  . Smoking status: Never Smoker  . Smokeless tobacco: Never Used  Substance and Sexual Activity  . Alcohol use: No  . Drug use: No  . Sexual activity: Not Currently  Lifestyle  . Physical activity:    Days per week: 0 days    Minutes per session: 0 min  . Stress: Not at all  Relationships  . Social connections:    Talks on phone: More than three times a week    Gets together: More than three times a week    Attends religious service: More than 4 times per year    Active member of club or organization: Yes    Attends meetings of clubs or organizations: More than 4 times per year    Relationship status: Married  Other Topics Concern  . Not on file  Social History Narrative  . Not on file    Activities of Daily Living In your present state of health, do you have any difficulty performing the following activities: 01/11/2019  Hearing? N  Vision? N  Difficulty concentrating or making decisions? N  Walking or climbing stairs? Y  Comment Chronic Bilateral Knee pain  Dressing or bathing? N  Doing errands, shopping? N  Preparing Food and eating ? N  Using the Toilet? N  In the past six months, have you accidently leaked urine? N  Do you have problems with loss of bowel control? N  Managing your Medications? N  Managing your Finances? N  Housekeeping or managing your  Housekeeping? N  Some recent data might be hidden    Patient Literacy How often do you need to have someone help you when you read instructions, pamphlets, or other written materials from your doctor or pharmacy?: 1 - Never What is the last grade level you completed in school?: Bachelor's degree  Exercise Current Exercise Habits: The patient does not participate in regular exercise at present, Exercise limited by: orthopedic condition(s)  Diet Patient reports consuming 2 meals a day and 1 snack(s) a day Patient reports that her primary diet is: Low fat Patient reports that she does have regular access to food.   Depression Screen PHQ 2/9 Scores 01/11/2019 11/20/2018 05/22/2018 10/27/2017 03/28/2017 09/09/2016 07/16/2016  PHQ - 2 Score  0 0 0 0 0 0 0     Fall Risk Fall Risk  01/11/2019 11/20/2018 05/22/2018 10/27/2017 03/28/2017  Falls in the past year? 1 1 No No No  Number falls in past yr: 0 0 - - -  Injury with Fall? 0 0 - - -     Objective:  Kelly Lewis seemed alert and oriented and she participated appropriately during our telephone visit.  Blood Pressure Weight BMI  BP Readings from Last 3 Encounters:  11/20/18 127/73  05/22/18 (!) 149/87  10/27/17 132/85   Wt Readings from Last 3 Encounters:  01/11/19 211 lb (95.7 kg)  11/20/18 211 lb (95.7 kg)  05/22/18 222 lb (100.7 kg)   BMI Readings from Last 1 Encounters:  01/11/19 34.06 kg/m    *Unable to obtain current vital signs, weight, and BMI due to telephone visit type  Hearing/Vision  . Kelly Lewis did not seem to have difficulty with hearing/understanding during the telephone conversation . Reports that she has had a formal eye exam by an eye care professional within the past year . Reports that she has not had a formal hearing evaluation within the past year *Unable to fully assess hearing and vision during telephone visit type  Cognitive Function: 6CIT Screen 01/11/2019  What Year? 0 points  What month? 0 points  What  time? 0 points  Count back from 20 0 points  Months in reverse 0 points  Repeat phrase 0 points  Total Score 0    Normal Cognitive Function Screening: Yes (Normal:0-7, Significant for Dysfunction: >8)  Immunization & Health Maintenance Record Immunization History  Administered Date(s) Administered  . Pneumococcal Conjugate-13 11/20/2015  . Pneumococcal Polysaccharide-23 06/11/2012, 10/27/2017  . Tdap 04/02/2012    Health Maintenance  Topic Date Due  . PAP SMEAR-Modifier  03/29/2019  . COLONOSCOPY  05/31/2020  . MAMMOGRAM  09/14/2020  . TETANUS/TDAP  04/02/2022  . DEXA SCAN  Completed  . Hepatitis C Screening  Completed  . PNA vac Low Risk Adult  Completed  . INFLUENZA VACCINE  Discontinued       Assessment  This is a routine wellness examination for Kelly Lewis.  Health Maintenance: Due or Overdue There are no preventive care reminders to display for this patient.  Kelly Lewis does not need a referral for Community Assistance: Care Management:   no Social Work:    no Prescription Assistance:  no Nutrition/Diabetes Education:  no   Plan:  Personalized Goals Goals Addressed            This Visit's Progress   . Exercise 3x per week (30 min per time)        Personalized Health Maintenance & Screening Recommendations  Bone densitometry screening  Lung Cancer Screening Recommended: no (Low Dose CT Chest recommended if Age 85-80 years, 30 pack-year currently smoking OR have quit w/in past 15 years) Hepatitis C Screening recommended: no HIV Screening recommended: no  Advanced Directives: Written information was not prepared per patient's request.  Referrals & Orders Orders Placed This Encounter  Procedures  . Varicella-zoster vaccine IM (Shingrix)    Follow-up Plan . Follow-up with Chevis Pretty, FNP as planned . Schedule Bone Density Scan if needed    I have personally reviewed and noted the following in the patient's chart:   . Medical  and social history . Use of alcohol, tobacco or illicit drugs  . Current medications and supplements . Functional ability and status . Nutritional status . Physical activity .  Advanced directives . List of other physicians . Hospitalizations, surgeries, and ER visits in previous 12 months . Vitals . Screenings to include cognitive, depression, and falls . Referrals and appointments  In addition, I have reviewed and discussed with Kelly Lewis certain preventive protocols, quality metrics, and best practice recommendations. A written personalized care plan for preventive services as well as general preventive health recommendations is available and can be mailed to the patient at her request.      Truett Mainland, LPN signature  [id number]   I have reviewed and agree with the above AWV documentation.   Mary-Margaret Hassell Done, FNP

## 2019-01-11 NOTE — Patient Instructions (Signed)
Kelly Lewis , Thank you for taking time to come for your Medicare Wellness Visit. I appreciate your ongoing commitment to your health goals. Please review the following plan we discussed and let me know if I can assist you in the future.   These are the goals we discussed: Goals    . Exercise 3x per week (30 min per time)       This is a list of the screening recommended for you and due dates:  Health Maintenance  Topic Date Due  . Pap Smear  03/29/2019  . Colon Cancer Screening  05/31/2020  . Mammogram  09/14/2020  . Tetanus Vaccine  04/02/2022  . DEXA scan (bone density measurement)  Completed  .  Hepatitis C: One time screening is recommended by Center for Disease Control  (CDC) for  adults born from [birth year] through 1965.   Completed  . Pneumonia vaccines  Completed  . Flu Shot  Discontinued     BMI for Adults  Body mass index (BMI) is a number that is calculated from a person's weight and height. BMI may help to estimate how much of a person's weight is composed of fat. BMI can help identify those who may be at higher risk for certain medical problems. How is BMI used with adults? BMI is used as a screening tool to identify possible weight problems. It is used to check whether a person is obese, overweight, healthy weight, or underweight. How is BMI calculated? BMI measures your weight and compares it to your height. This can be done either in [place] ([place] or metric measurements. Note that charts are available to help you find your BMI quickly and easily without having to do these calculations yourself. To calculate your BMI in [place] ([place] measurements, your health care provider will: 1. Measure your weight in pounds (lb). 2. Multiply the number of pounds by 703. ? For example, for a person who weighs 180 lb, multiply that number by 703, which equals 126,540. 3. Measure your height in inches (in). Then multiply that number by itself to get a measurement called "inches squared."  ? For example, for a person who is 70 in tall, the "inches squared" measurement is 70 in x 70 in, which equals 4900 inches squared. 4. Divide the total from Step 2 (number of lb x 703) by the total from Step 3 (inches squared): 126,540  4900 = 25.8. This is your BMI. To calculate your BMI in metric measurements, your health care provider will: 1. Measure your weight in kilograms (kg). 2. Measure your height in meters (m). Then multiply that number by itself to get a measurement called "meters squared." ? For example, for a person who is 1.75 m tall, the "meters squared" measurement is 1.75 m x 1.75 m, which is equal to 3.1 meters squared. 3. Divide the number of kilograms (your weight) by the meters squared number. In this example: 70  3.1 = 22.6. This is your BMI. How is BMI interpreted? To interpret your results, your health care provider will use BMI charts to identify whether you are underweight, normal weight, overweight, or obese. The following guidelines will be used:  Underweight: BMI less than 18.5.  Normal weight: BMI between 18.5 and 24.9.  Overweight: BMI between 25 and 29.9.  Obese: BMI of 30 and above. Please note:  Weight includes both fat and muscle, so someone with a muscular build, such as an athlete, may have a BMI that is higher than 24.9. In  cases like these, BMI is not an accurate measure of body fat.  To determine if excess body fat is the cause of a BMI of 25 or higher, further assessments may need to be done by a health care provider.  BMI is usually interpreted in the same way for men and women. Why is BMI a useful tool? BMI is useful in two ways:  Identifying a weight problem that may be related to a medical condition, or that may increase the risk for medical problems.  Promoting lifestyle and diet changes in order to reach a healthy weight. Summary  Body mass index (BMI) is a number that is calculated from a person's weight and height.  BMI may help  to estimate how much of a person's weight is composed of fat. BMI can help identify those who may be at higher risk for certain medical problems.  BMI can be measured using English measurements or metric measurements.  To interpret your results, your health care provider will use BMI charts to identify whether you are underweight, normal weight, overweight, or obese. This information is not intended to replace advice given to you by your health care provider. Make sure you discuss any questions you have with your health care provider. Document Released: 04/30/2004 Document Revised: 07/02/2017 Document Reviewed: 07/02/2017 Elsevier Interactive Patient Education  2019 [employer].  Exercise for Older Adults Staying physically active is important as you age. The four types of exercises that are best for older adults are endurance, strength, balance, and flexibility. Contact your health care provider before you start any exercise routine. Ask your health care provider what activities are safe for you. What are the risks? Risks associated with exercising include:  Overdoing it. This may lead to sore muscles or fatigue.  Falls.  Injuries.  Dehydration. How to do these exercises Endurance exercises Endurance (aerobic) exercises raise your breathing rate and heart rate. Increasing your endurance helps you to do everyday tasks and stay healthy. By improving the health of your body system that includes your heart, lungs, and blood vessels (circulatory system), you may also delay or prevent diseases such as heart disease, diabetes, and bone loss (osteoporosis). Types of endurance exercises include:  Sports.  Indoor activities, such as using gym equipment, doing water aerobics, or dancing.  Outdoor activities, such as biking or jogging.  Tasks around the house, such as gardening, yard work, and heavy household chores like cleaning.  Walking, such as hiking or walking around your neighborhood.  When doing endurance exercises, make sure you:  Are aware of your surroundings.  Use safety equipment as directed.  Dress in layers when exercising outdoors.  Drink plenty of water to stay well hydrated. Build up endurance slowly. Start with 10 minutes at a time, and gradually build up to doing 30 minutes at a time. Unless your health care provider gave you different instructions, aim to exercise for a total of 150 minutes a week. Spread out that time so you are working on endurance on 3 or more days a week. Strength exercises Lifting, pulling, or pushing weights helps to strengthen muscles. Having stronger muscles makes it easier to do everyday activities, such as getting up from a chair, climbing stairs, carrying groceries, and playing with grandchildren. Strength exercises include arm and leg exercises that may be done:  With weights.  Without weights (using your own body weight).  With a resistance band. When doing strength exercises:  Move smoothly and steadily. Do not suddenly thrust or jerk  the weights, the resistance band, or your body.  Start with no weights or with light weights, and gradually add more weight over time. Eventually, aim to use weights that are hard or very hard for you to lift. This means that you are able to do 8 repetitions with the weight, and the last few repetitions are very challenging.  Lift or push weights into position for 3 seconds, hold the position for 1 second, and then take 3 seconds to return to your starting position.  Breathe out (exhale) during difficult movements, like lifting or pushing weights. Breathe in (inhale) to relax your muscles before the next repetition.  Consider alternating arms or legs, especially when you first start strength exercises.  Expect some slight muscle soreness after each session. Do strength exercises on 2 or more days a week, for 30 minutes at a time. Avoid exercising the same muscle groups two days in a row. For  example, if you work on your leg muscles one day, work on your arm muscles the next day. When you can do two sets of 10-15 repetitions with a certain weight, increase the amount of weight. Balance Balance exercises can help to prevent falls. Balance exercises include:  Standing on one foot.  Heel-to-toe walk.  Balance walk.  Tai chi. Make sure you have something sturdy to hold onto while doing balance exercises, such as a sturdy chair. As your balance improves, challenge yourself by holding onto the chair with one hand instead of two, and then with no hands. Trying exercises with your eyes closed also challenges your balance, but be sure to have a sturdy surface (like a countertop) close by in case you need it. Do balance exercises as often as you want, or as often as directed by your health care provider. Strength exercises for the lower body also help to improve balance. Flexibility Flexibility exercises improve how far you can bend, straighten, move, or rotate parts of your body (range of motion). These exercises also help you to do everyday activities such as getting dressed or reaching for objects. Flexibility exercises include stretching different parts of the body, and they may be done in a standing or seated position or on the floor. When stretching, make sure you:  Keep a slight bend in your arms and legs. Avoid completely straightening ("locking") your joints.  Do not stretch so far that you feel pain. You should feel a mild stretching feeling. You may try stretching farther as you become more flexible over time.  Relax and breathe between stretches.  Hold onto something sturdy for balance as needed. Hold each stretch for 10-30 seconds. Repeat each stretch 3-5 times. General safety tips  Exercise in well-lit areas.  Do not hold your breath during exercises or stretches.  Warm up before exercising, and cool down after exercising. This can help prevent injury.  Drink plenty  of water during exercise or any activity that makes you sweat.  Use smooth, steady movements. Do not use sudden, jerking movements, especially when lifting weights or doing flexibility exercises.  If you are not sure if an exercise is safe for you, or you are not sure how to do an exercise, talk with your health care provider. This is especially important if you have had surgery on muscles, bones, or joints (orthopedic surgery). Where to find more information You can find more information about exercise for older adults from:  Your local health department, fitness center, or community center. These facilities may have programs for  aging adults.  [employer] on Aging: http://kim-miller.com/  National Council on Aging: www.ncoa.org Summary  Staying physically active is important as you age.  Make sure to contact your health care provider before you start any exercise routine. Ask your health care provider what activities are safe for you.  Doing endurance, strength, balance, and flexibility exercises can help to delay or prevent certain diseases, such as heart disease, diabetes, and bone loss (osteoporosis). This information is not intended to replace advice given to you by your health care provider. Make sure you discuss any questions you have with your health care provider. Document Released: 01/08/2017 Document Revised: 01/08/2017 Document Reviewed: 01/08/2017 Elsevier Interactive Patient Education  2019 [employer].

## 2019-04-19 ENCOUNTER — Other Ambulatory Visit: Payer: Self-pay | Admitting: Nurse Practitioner

## 2019-04-19 DIAGNOSIS — S39012A Strain of muscle, fascia and tendon of lower back, initial encounter: Secondary | ICD-10-CM

## 2019-04-19 DIAGNOSIS — F411 Generalized anxiety disorder: Secondary | ICD-10-CM

## 2019-05-17 ENCOUNTER — Other Ambulatory Visit: Payer: Self-pay | Admitting: Nurse Practitioner

## 2019-05-17 DIAGNOSIS — I10 Essential (primary) hypertension: Secondary | ICD-10-CM

## 2019-05-17 DIAGNOSIS — E039 Hypothyroidism, unspecified: Secondary | ICD-10-CM

## 2019-05-24 ENCOUNTER — Ambulatory Visit: Payer: Medicare Other | Admitting: Nurse Practitioner

## 2019-05-24 ENCOUNTER — Other Ambulatory Visit: Payer: Self-pay

## 2019-05-25 ENCOUNTER — Ambulatory Visit: Payer: Medicare Other | Admitting: Nurse Practitioner

## 2019-05-25 ENCOUNTER — Encounter: Payer: Self-pay | Admitting: Nurse Practitioner

## 2019-05-25 VITALS — BP 120/68 | HR 85 | Temp 97.9°F | Ht 66.0 in | Wt 215.0 lb

## 2019-05-25 DIAGNOSIS — E039 Hypothyroidism, unspecified: Secondary | ICD-10-CM

## 2019-05-25 DIAGNOSIS — K219 Gastro-esophageal reflux disease without esophagitis: Secondary | ICD-10-CM | POA: Diagnosis not present

## 2019-05-25 DIAGNOSIS — F411 Generalized anxiety disorder: Secondary | ICD-10-CM

## 2019-05-25 DIAGNOSIS — I10 Essential (primary) hypertension: Secondary | ICD-10-CM

## 2019-05-25 DIAGNOSIS — Z6834 Body mass index (BMI) 34.0-34.9, adult: Secondary | ICD-10-CM | POA: Diagnosis not present

## 2019-05-25 DIAGNOSIS — Z23 Encounter for immunization: Secondary | ICD-10-CM | POA: Diagnosis not present

## 2019-05-25 MED ORDER — LISINOPRIL 10 MG PO TABS: 10.0000 mg | ORAL_TABLET | Freq: Every day | ORAL | 1 refills | Status: DC

## 2019-05-25 MED ORDER — OMEPRAZOLE 20 MG PO CPDR: 20.0000 mg | DELAYED_RELEASE_CAPSULE | Freq: Every day | ORAL | 1 refills | Status: DC

## 2019-05-25 MED ORDER — LORAZEPAM 0.5 MG PO TABS: 0.5000 mg | ORAL_TABLET | Freq: Two times a day (BID) | ORAL | 2 refills | Status: DC | PRN

## 2019-05-25 MED ORDER — LEVOTHYROXINE SODIUM 100 MCG PO TABS: 100.0000 ug | ORAL_TABLET | Freq: Every day | ORAL | 1 refills | Status: DC

## 2019-05-25 MED ORDER — LORAZEPAM 0.5 MG PO TABS: 0.5000 mg | ORAL_TABLET | Freq: Two times a day (BID) | ORAL | 5 refills | Status: DC | PRN

## 2019-05-25 NOTE — Patient Instructions (Signed)
Exercising to Stay Healthy To become healthy and stay healthy, it is recommended that you do moderate-intensity and vigorous-intensity exercise. You can tell that you are exercising at a moderate intensity if your heart starts beating faster and you start breathing faster but can still hold a conversation. You can tell that you are exercising at a vigorous intensity if you are breathing much harder and faster and cannot hold a conversation while exercising. Exercising regularly is important. It has many health benefits, such as:  Improving overall fitness, flexibility, and endurance.  Increasing bone density.  Helping with weight control.  Decreasing body fat.  Increasing muscle strength.  Reducing stress and tension.  Improving overall health. How often should I exercise? Choose an activity that you enjoy, and set realistic goals. Your health care provider can help you make an activity plan that works for you. Exercise regularly as told by your health care provider. This may include:  Doing strength training two times a week, such as: ? Lifting weights. ? Using resistance bands. ? Push-ups. ? Sit-ups. ? Yoga.  Doing a certain intensity of exercise for a given amount of time. Choose from these options: ? A total of 150 minutes of moderate-intensity exercise every week. ? A total of 75 minutes of vigorous-intensity exercise every week. ? A mix of moderate-intensity and vigorous-intensity exercise every week. Children, pregnant women, people who have not exercised regularly, people who are overweight, and older adults may need to talk with a health care provider about what activities are safe to do. If you have a medical condition, be sure to talk with your health care provider before you start a new exercise program. What are some exercise ideas? Moderate-intensity exercise ideas include:  Walking 1 mile (1.6 km) in about 15 minutes.  Biking.  Hiking.  Golfing.  Dancing.   Water aerobics. Vigorous-intensity exercise ideas include:  Walking 4.5 miles (7.2 km) or more in about 1 hour.  Jogging or running 5 miles (8 km) in about 1 hour.  Biking 10 miles (16.1 km) or more in about 1 hour.  Lap swimming.  Roller-skating or in-line skating.  Cross-country skiing.  Vigorous competitive sports, such as football, basketball, and soccer.  Jumping rope.  Aerobic dancing. What are some everyday activities that can help me to get exercise?  Yard work, such as: ? Pushing a lawn mower. ? Raking and bagging leaves.  Washing your car.  Pushing a stroller.  Shoveling snow.  Gardening.  Washing windows or floors. How can I be more active in my day-to-day activities?  Use stairs instead of an elevator.  Take a walk during your lunch break.  If you drive, park your car farther away from your work or school.  If you take public transportation, get off one stop early and walk the rest of the way.  Stand up or walk around during all of your indoor phone calls.  Get up, stretch, and walk around every 30 minutes throughout the day.  Enjoy exercise with a friend. Support to continue exercising will help you keep a regular routine of activity. What guidelines can I follow while exercising?  Before you start a new exercise program, talk with your health care provider.  Do not exercise so much that you hurt yourself, feel dizzy, or get very short of breath.  Wear comfortable clothes and wear shoes with good support.  Drink plenty of water while you exercise to prevent dehydration or heat stroke.  Work out until your breathing   and your heartbeat get faster. Where to find more information  U.S. Department of Health and Human Services: www.hhs.gov  Centers for Disease Control and Prevention (CDC): www.cdc.gov Summary  Exercising regularly is important. It will improve your overall fitness, flexibility, and endurance.  Regular exercise also will  improve your overall health. It can help you control your weight, reduce stress, and improve your bone density.  Do not exercise so much that you hurt yourself, feel dizzy, or get very short of breath.  Before you start a new exercise program, talk with your health care provider. This information is not intended to replace advice given to you by your health care provider. Make sure you discuss any questions you have with your health care provider. Document Released: 09/21/2010 Document Revised: 08/01/2017 Document Reviewed: 07/10/2017 Elsevier Patient Education  2020 Elsevier Inc.  

## 2019-05-25 NOTE — Progress Notes (Signed)
Subjective:    Patient ID: Kelly Lewis, female    DOB: 16-Jul-1951, 68 y.o.   MRN: [id number]   Chief Complaint: Medical Management of Chronic Issues    HPI:  1. Essential hypertension No c/o chest pain, sob or headache. Does check bllod pressure at homeand os in [id number] systolic. BP Readings from Last 3 Encounters:  05/25/19 120/68  11/20/18 127/73  05/22/18 (!) 149/87     2. Acquired Hypothyroidism having no problems that she is aware of. This SmartLink has not been configured with any valid records.   Lab Results  Component Value Date   TSH 1.410 11/20/2018      3. Gastroesophageal reflux disease without esophagitis Is on daily omeprazole and is doing well.  4. GAD (generalized anxiety disorder) Is on ativan bid most days. She does take at least 1x a day to keep her calm.  5. BMI 34.0-34.9,adult No recent weight chnages. Wt Readings from Last 3 Encounters:  05/25/19 215 lb (97.5 kg)  01/11/19 211 lb (95.7 kg)  11/20/18 211 lb (95.7 kg)   BMI Readings from Last 3 Encounters:  05/25/19 34.70 kg/m  01/11/19 34.06 kg/m  11/20/18 34.06 kg/m       Outpatient Encounter Medications as of 05/25/2019  Medication Sig  . Cholecalciferol (VITAMIN D3) 2000 UNITS TABS Take by mouth.  . cyclobenzaprine (FLEXERIL) 10 MG tablet TAKE 1 TABLET BY MOUTH THREE TIMES A DAY AS NEEDED FOR MUSCLE SPASMS  . ibuprofen (ADVIL,MOTRIN) 200 MG tablet Take 200 mg by mouth every 6 (six) hours as needed.  Marland Kitchen levothyroxine (SYNTHROID) 100 MCG tablet TAKE 1 TABLET BY MOUTH EVERY DAY  . lisinopril (ZESTRIL) 10 MG tablet TAKE 1 TABLET BY MOUTH EVERY DAY  . LORazepam (ATIVAN) 0.5 MG tablet TAKE 1 TABLET (0.5 MG TOTAL) BY MOUTH 2 (TWO) TIMES DAILY AS NEEDED.  Marland Kitchen omeprazole (PRILOSEC) 20 MG capsule Take 1 capsule (20 mg total) by mouth daily.     Past Surgical History:  Procedure Laterality Date  . bladder tacked      Family History  Problem Relation Age of Onset  . Diabetes Mother    . Pneumonia Father   . Cancer Father        skin  . Cancer Sister        skin    New complaints: None today  Social history: Lives with husband and are both rather healthy  Controlled substance contract: 05/25/19    Review of Systems  Constitutional: Negative for activity change and appetite change.  HENT: Negative.   Eyes: Negative for pain.  Respiratory: Negative for shortness of breath.   Cardiovascular: Negative for chest pain, palpitations and leg swelling.  Gastrointestinal: Negative for abdominal pain.  Endocrine: Negative for polydipsia.  Genitourinary: Negative.   Skin: Negative for rash.  Neurological: Negative for dizziness, weakness and headaches.  Hematological: Does not bruise/bleed easily.  Psychiatric/Behavioral: Negative.   All other systems reviewed and are negative.      Objective:   Physical Exam Vitals signs and nursing note reviewed.  Constitutional:      General: She is not in acute distress.    Appearance: Normal appearance. She is well-developed.  HENT:     Head: Normocephalic.     Nose: Nose normal.  Eyes:     Pupils: Pupils are equal, round, and reactive to light.  Neck:     Musculoskeletal: Normal range of motion and neck supple.     Vascular: No carotid bruit  or JVD.  Cardiovascular:     Rate and Rhythm: Normal rate and regular rhythm.     Heart sounds: Normal heart sounds.  Pulmonary:     Effort: Pulmonary effort is normal. No respiratory distress.     Breath sounds: Normal breath sounds. No wheezing or rales.  Chest:     Chest wall: No tenderness.  Abdominal:     General: Bowel sounds are normal. There is no distension or abdominal bruit.     Palpations: Abdomen is soft. There is no hepatomegaly, splenomegaly, mass or pulsatile mass.     Tenderness: There is no abdominal tenderness.  Musculoskeletal: Normal range of motion.  Lymphadenopathy:     Cervical: No cervical adenopathy.  Skin:    General: Skin is warm and dry.   Neurological:     Mental Status: She is alert and oriented to person, place, and time.     Deep Tendon Reflexes: Reflexes are normal and symmetric.  Psychiatric:        Behavior: Behavior normal.        Thought Content: Thought content normal.        Judgment: Judgment normal.     BP 120/68   Pulse 85   Temp 97.9 F (36.6 C) (Oral)   Ht 5\' 6"  (1.676 m)   Wt 215 lb (97.5 kg)   SpO2 99%   BMI 34.70 kg/m       Assessment & Plan:  Kelly Lewis comes in today with chief complaint of Medical Management of Chronic Issues   Diagnosis and orders addressed:  1. Essential hypertension Low sodium diet - lisinopril (ZESTRIL) 10 MG tablet; Take 1 tablet (10 mg total) by mouth daily.  Dispense: 90 tablet; Refill: 1  2. Acquired hypothyroidism - levothyroxine (SYNTHROID) 100 MCG tablet; Take 1 tablet (100 mcg total) by mouth daily.  Dispense: 90 tablet; Refill: 1  3. Gastroesophageal reflux disease without esophagitis Avoid spicy foods Do not eat 2 hours prior to bedtime - omeprazole (PRILOSEC) 20 MG capsule; Take 1 capsule (20 mg total) by mouth daily.  Dispense: 90 capsule; Refill: 1  4. GAD (generalized anxiety disorder) Stress management - LORazepam (ATIVAN) 0.5 MG tablet; Take 1 tablet (0.5 mg total) by mouth 2 (two) times daily as needed.  Dispense: 60 tablet; Refill: 2  5. BMI 34.0-34.9,adult Discussed diet and exercise for person with BMI >25 Will recheck weight in 3-6 months     Labs pending Health Maintenance reviewed Diet and exercise encouraged  Follow up plan: 3 months   Mary-Margaret Hassell Done, FNP

## 2019-05-25 NOTE — Addendum Note (Signed)
Addended by: Chevis Pretty on: 05/25/2019 09:34 AM   Modules accepted: Orders

## 2019-05-26 LAB — CMP14+EGFR
ALT: 28 IU/L (ref 0–32)
AST: 49 IU/L — ABNORMAL HIGH (ref 0–40)
Albumin/Globulin Ratio: 1.7 (ref 1.2–2.2)
Albumin: 4.6 g/dL (ref 3.8–4.8)
Alkaline Phosphatase: 219 IU/L — ABNORMAL HIGH (ref 39–117)
BUN/Creatinine Ratio: 9 — ABNORMAL LOW (ref 12–28)
BUN: 8 mg/dL (ref 8–27)
Bilirubin Total: 0.6 mg/dL (ref 0.0–1.2)
CO2: 22 mmol/L (ref 20–29)
Calcium: 10.1 mg/dL (ref 8.7–10.3)
Chloride: 99 mmol/L (ref 96–106)
Creatinine, Ser: 0.87 mg/dL (ref 0.57–1.00)
GFR calc Af Amer: 79 mL/min/{1.73_m2} (ref >59)
GFR calc non Af Amer: 69 mL/min/{1.73_m2} (ref >59)
Globulin, Total: 2.7 g/dL (ref 1.5–4.5)
Glucose: 100 mg/dL — ABNORMAL HIGH (ref 65–99)
Potassium: 4.6 mmol/L (ref 3.5–5.2)
Sodium: 136 mmol/L (ref 134–144)
Total Protein: 7.3 g/dL (ref 6.0–8.5)

## 2019-05-26 LAB — LIPID PANEL
Chol/HDL Ratio: 4 ratio (ref 0.0–4.4)
Cholesterol, Total: 217 mg/dL — ABNORMAL HIGH (ref 100–199)
HDL: 54 mg/dL (ref >39)
LDL Chol Calc (NIH): 142 mg/dL — ABNORMAL HIGH (ref 0–99)
Triglycerides: 116 mg/dL (ref 0–149)
VLDL Cholesterol Cal: 21 mg/dL (ref 5–40)

## 2019-05-26 LAB — THYROID PANEL WITH TSH
Free Thyroxine Index: 3.2 (ref 1.2–4.9)
T3 Uptake Ratio: 23 % — ABNORMAL LOW (ref 24–39)
T4, Total: 14.1 ug/dL — ABNORMAL HIGH (ref 4.5–12.0)
TSH: 2.42 u[IU]/mL (ref 0.450–4.500)

## 2019-07-05 ENCOUNTER — Other Ambulatory Visit: Payer: Self-pay

## 2019-07-06 ENCOUNTER — Encounter: Payer: Self-pay | Admitting: Nurse Practitioner

## 2019-07-06 ENCOUNTER — Ambulatory Visit (INDEPENDENT_AMBULATORY_CARE_PROVIDER_SITE_OTHER): Payer: Medicare Other | Admitting: Nurse Practitioner

## 2019-07-06 VITALS — BP 124/65 | HR 73 | Temp 98.6°F | Resp 20 | Ht 66.0 in | Wt 211.0 lb

## 2019-07-06 DIAGNOSIS — R000 Tachycardia, unspecified: Secondary | ICD-10-CM

## 2019-07-06 NOTE — Patient Instructions (Signed)
Sinus Tachycardia  Sinus tachycardia is a kind of fast heartbeat. In sinus tachycardia, the heart beats more than 100 times a minute. Sinus tachycardia starts in a part of the heart called the sinus node. Sinus tachycardia may be harmless, or it may be a sign of a serious condition. What are the causes? This condition may be caused by:  Exercise or exertion.  A fever.  Pain.  Loss of body fluids (dehydration).  Severe bleeding (hemorrhage).  Anxiety and stress.  Certain substances, including: ? Alcohol. ? Caffeine. ? Tobacco and nicotine products. ? Cold medicines. ? Illegal drugs.  Medical conditions including: ? Heart disease. ? An infection. ? An overactive thyroid (hyperthyroidism). ? A lack of red blood cells (anemia). What are the signs or symptoms? Symptoms of this condition include:  A feeling that the heart is beating quickly (palpitations).  Suddenly noticing your heartbeat (cardiac awareness).  Dizziness.  Tiredness (fatigue).  Shortness of breath.  Chest pain.  Nausea.  Fainting. How is this diagnosed? This condition is diagnosed with:  A physical exam.  Other tests, such as: ? Blood tests. ? An electrocardiogram (ECG). This test measures the electrical activity of the heart. ? Ambulatory cardiac monitor. This records your heartbeats for 24 hours or more. You may be referred to a heart specialist (cardiologist). How is this treated? Treatment for this condition depends on the cause or the underlying condition. Treatment may involve:  Treating the underlying condition.  Taking new medicines or changing your current medicines as told by your health care provider.  Making changes to your diet or lifestyle. Follow these instructions at home: Lifestyle   Do not use any products that contain nicotine or tobacco, such as cigarettes and e-cigarettes. If you need help quitting, ask your health care provider.  Do not use illegal drugs, such as  cocaine.  Learn relaxation methods to help you when you get stressed or anxious. These include deep breathing.  Avoid caffeine or other stimulants. Alcohol use   Do not drink alcohol if: ? Your health care provider tells you not to drink. ? You are pregnant, may be pregnant, or are planning to become pregnant.  If you drink alcohol, limit how much you have: ? 0-1 drink a day for women. ? 0-2 drinks a day for men.  Be aware of how much alcohol is in your drink. In the [place] one drink equals one typical bottle of beer (12 oz), one-half glass of wine (5 oz), or one shot of hard liquor (1 oz). General instructions  Drink enough fluids to keep your urine pale yellow.  Take over-the-counter and prescription medicines only as told by your health care provider.  Keep all follow-up visits as told by your health care provider. This is important. Contact a health care provider if you have:  A fever.  Vomiting or diarrhea that does not go away. Get help right away if you:  Have pain in your chest, upper arms, jaw, or neck.  Become weak or dizzy.  Feel faint.  Have palpitations that do not go away. Summary  In sinus tachycardia, the heart beats more than 100 times a minute.  Sinus tachycardia may be harmless, or it may be a sign of a serious condition.  Treatment for this condition depends on the cause or the underlying condition.  Get help right away if you have pain in your chest, upper arms, jaw, or neck. This information is not intended to replace advice given to you by   your health care provider. Make sure you discuss any questions you have with your health care provider. Document Released: 09/26/2004 Document Revised: 10/08/2017 Document Reviewed: 10/08/2017 Elsevier Patient Education  2020 [employer].

## 2019-07-06 NOTE — Progress Notes (Signed)
Subjective:    Patient ID: Kelly Lewis, female    DOB: Mar 31, 1951, 68 y.o.   MRN: [id number]   Chief Complaint: Fast heart rate Friday night (Called EMS and they checked her out and told her to follow up)   HPI Patient comes in to discuss and episode that happened to her Friday night.  She said she was laying on cough playing on her Ipad. She felt a "internal burp" in her chest. She sat up and her heart started pounding. She felt a tightness up to throat that went away in several minutes. She got up and took a baby ASA. She called 911 and EMS did a heart tracing. Which showed sinus tachycardia at rate of 102. Lasted about 10-15 minutes and went away on its own. She denies any SOB , sweating. Her blood pressure was a little high. Since the episode she has been fine.   Review of Systems  Constitutional: Negative for activity change and appetite change.  HENT: Negative.   Eyes: Negative for pain.  Respiratory: Negative for chest tightness and shortness of breath.   Cardiovascular: Negative for chest pain, palpitations and leg swelling.  Gastrointestinal: Negative for abdominal pain.  Endocrine: Negative for polydipsia.  Genitourinary: Negative.   Skin: Negative for rash.  Neurological: Negative for dizziness, weakness and headaches.  Hematological: Does not bruise/bleed easily.  Psychiatric/Behavioral: Negative.   All other systems reviewed and are negative.      Objective:   Physical Exam Vitals signs and nursing note reviewed.  Constitutional:      General: She is not in acute distress.    Appearance: Normal appearance. She is well-developed.  HENT:     Head: Normocephalic.     Nose: Nose normal.  Eyes:     Pupils: Pupils are equal, round, and reactive to light.  Neck:     Musculoskeletal: Normal range of motion and neck supple.     Vascular: No carotid bruit or JVD.  Cardiovascular:     Rate and Rhythm: Normal rate and regular rhythm.     Heart sounds: Normal heart  sounds.  Pulmonary:     Effort: Pulmonary effort is normal. No respiratory distress.     Breath sounds: Normal breath sounds. No wheezing or rales.  Chest:     Chest wall: No tenderness.  Abdominal:     General: Bowel sounds are normal. There is no distension or abdominal bruit.     Palpations: Abdomen is soft. There is no hepatomegaly, splenomegaly, mass or pulsatile mass.     Tenderness: There is no abdominal tenderness.  Musculoskeletal: Normal range of motion.  Lymphadenopathy:     Cervical: No cervical adenopathy.  Skin:    General: Skin is warm and dry.  Neurological:     Mental Status: She is alert and oriented to person, place, and time.     Deep Tendon Reflexes: Reflexes are normal and symmetric.  Psychiatric:        Behavior: Behavior normal.        Thought Content: Thought content normal.        Judgment: Judgment normal.    BP 124/65   Pulse 73   Temp 98.6 F (37 C) (Temporal)   Resp 20   Ht 5\' 6"  (1.676 m)   Wt 211 lb (95.7 kg)   SpO2 100%   BMI 34.06 kg/m    EKG- NSR- Mary-Margaret Hassell Done, FNP     Assessment & Plan:  Kelly Lewis in  today with chief complaint of Fast heart rate Friday night (Called EMS and they checked her out and told her to follow up)   1. Tachycardia Avoid caffeine Avoid spicy foods Keep diary of episodes RTO prn - EKG 12-Lead   Mary-Margaret Hassell Done, FNP

## 2019-09-03 DIAGNOSIS — M8580 Other specified disorders of bone density and structure, unspecified site: Secondary | ICD-10-CM

## 2019-09-03 HISTORY — DX: Other specified disorders of bone density and structure, unspecified site: M85.80

## 2019-09-20 DIAGNOSIS — Z1231 Encounter for screening mammogram for malignant neoplasm of breast: Secondary | ICD-10-CM | POA: Diagnosis not present

## 2019-09-23 ENCOUNTER — Ambulatory Visit: Payer: Medicare PPO | Attending: Internal Medicine

## 2019-09-23 DIAGNOSIS — Z23 Encounter for immunization: Secondary | ICD-10-CM | POA: Insufficient documentation

## 2019-09-23 NOTE — Progress Notes (Signed)
   Covid-19 Vaccination Clinic  Name:  Kelly Lewis    MRN: [id number] DOB: 23-Feb-1951  09/23/2019  Kelly Lewis was observed post Covid-19 immunization for 15 minutes without incidence. She was provided with Vaccine Information Sheet and instruction to access the V-Safe system.   Kelly Lewis was instructed to call 911 with any severe reactions post vaccine: Marland Kitchen Difficulty breathing  . Swelling of your face and throat  . A fast heartbeat  . A bad rash all over your body  . Dizziness and weakness    Immunizations Administered    Name Date Dose VIS Date Route   Pfizer COVID-19 Vaccine 09/23/2019  1:39 PM 0.3 mL 08/13/2019 Intramuscular   Manufacturer: [place]   Lot: [id number]   [place]: [id number]

## 2019-10-08 ENCOUNTER — Ambulatory Visit: Payer: Medicare PPO | Admitting: Nurse Practitioner

## 2019-10-08 ENCOUNTER — Other Ambulatory Visit: Payer: Self-pay

## 2019-10-08 ENCOUNTER — Encounter: Payer: Self-pay | Admitting: Nurse Practitioner

## 2019-10-08 VITALS — BP 137/67 | HR 87 | Temp 96.8°F | Resp 20 | Ht 66.0 in | Wt 209.0 lb

## 2019-10-08 DIAGNOSIS — H6122 Impacted cerumen, left ear: Secondary | ICD-10-CM | POA: Diagnosis not present

## 2019-10-08 NOTE — Progress Notes (Addendum)
   Subjective:    Patient ID: Kelly Lewis, female    DOB: 04/19/1951, 69 y.o.   MRN: [id number]   Chief Complaint: Left ear pressure   HPI Patient come sin c/o pressure in left ear. She says now the ear feels like it is going to pop. Says she heres a "chirping" sound. Denies any drainage.    Review of Systems  Constitutional: Negative for diaphoresis.  HENT: Positive for ear pain (pressure left ear). Negative for ear discharge.   Eyes: Negative for pain.  Respiratory: Negative for shortness of breath.   Cardiovascular: Negative for chest pain, palpitations and leg swelling.  Gastrointestinal: Negative for abdominal pain.  Endocrine: Negative for polydipsia.  Skin: Negative for rash.  Neurological: Negative for dizziness, weakness and headaches.  Hematological: Does not bruise/bleed easily.  All other systems reviewed and are negative.      Objective:   Physical Exam Vitals and nursing note reviewed.  Constitutional:      Appearance: Normal appearance. She is obese.  HENT:     Right Ear: Hearing, tympanic membrane, ear canal and external ear normal.     Left Ear: There is impacted cerumen.  Cardiovascular:     Rate and Rhythm: Normal rate and regular rhythm.     Heart sounds: Normal heart sounds.  Pulmonary:     Breath sounds: Normal breath sounds.  Skin:    General: Skin is warm.  Neurological:     General: No focal deficit present.     Mental Status: She is alert and oriented to person, place, and time.    BP 137/67   Pulse 87   Temp (!) 96.8 F (36 C) (Temporal)   Resp 20   Ht 5\' 6"  (1.676 m)   Wt 209 lb (94.8 kg)   SpO2 99%   BMI 33.73 kg/m    S/P ear lavage- left TM normal- no effusion     Assessment & Plan:  Kelly Lewis in today with chief complaint of Left ear pressure   1. Impacted cerumen of left ear DEBROX 3 X A WEEK rto PRN    The above assessment and management plan was discussed with the patient. The patient verbalized  understanding of and has agreed to the management plan. Patient is aware to call the clinic if symptoms persist or worsen. Patient is aware when to return to the clinic for a follow-up visit. Patient educated on when it is appropriate to go to the emergency department.   Mary-Margaret Hassell Done, FNP

## 2019-10-08 NOTE — Patient Instructions (Signed)
Earwax Buildup, Adult The ears produce a substance called earwax that helps keep bacteria out of the ear and protects the skin in the ear canal. Occasionally, earwax can build up in the ear and cause discomfort or hearing loss. What increases the risk? This condition is more likely to develop in people who:  Are female.  Are elderly.  Naturally produce more earwax.  Clean their ears often with cotton swabs.  Use earplugs often.  Use in-ear headphones often.  Wear hearing aids.  Have narrow ear canals.  Have earwax that is overly thick or sticky.  Have eczema.  Are dehydrated.  Have excess hair in the ear canal. What are the signs or symptoms? Symptoms of this condition include:  Reduced or muffled hearing.  A feeling of fullness in the ear or feeling that the ear is plugged.  Fluid coming from the ear.  Ear pain.  Ear itch.  Ringing in the ear.  Coughing.  An obvious piece of earwax that can be seen inside the ear canal. How is this diagnosed? This condition may be diagnosed based on:  Your symptoms.  Your medical history.  An ear exam. During the exam, your health care provider will look into your ear with an instrument called an otoscope. You may have tests, including a hearing test. How is this treated? This condition may be treated by:  Using ear drops to soften the earwax.  Having the earwax removed by a health care provider. The health care provider may: ? Flush the ear with water. ? Use an instrument that has a loop on the end (curette). ? Use a suction device.  Surgery to remove the wax buildup. This may be done in severe cases. Follow these instructions at home:   Take over-the-counter and prescription medicines only as told by your health care provider.  Do not put any objects, including cotton swabs, into your ear. You can clean the opening of your ear canal with a washcloth or facial tissue.  Follow instructions from your health care  provider about cleaning your ears. Do not over-clean your ears.  Drink enough fluid to keep your urine clear or pale yellow. This will help to thin the earwax.  Keep all follow-up visits as told by your health care provider. If earwax builds up in your ears often or if you use hearing aids, consider seeing your health care provider for routine, preventive ear cleanings. Ask your health care provider how often you should schedule your cleanings.  If you have hearing aids, clean them according to instructions from the manufacturer and your health care provider. Contact a health care provider if:  You have ear pain.  You develop a fever.  You have blood, pus, or other fluid coming from your ear.  You have hearing loss.  You have ringing in your ears that does not go away.  Your symptoms do not improve with treatment.  You feel like the room is spinning (vertigo). Summary  Earwax can build up in the ear and cause discomfort or hearing loss.  The most common symptoms of this condition include reduced or muffled hearing and a feeling of fullness in the ear or feeling that the ear is plugged.  This condition may be diagnosed based on your symptoms, your medical history, and an ear exam.  This condition may be treated by using ear drops to soften the earwax or by having the earwax removed by a health care provider.  Do not put any   objects, including cotton swabs, into your ear. You can clean the opening of your ear canal with a washcloth or facial tissue. This information is not intended to replace advice given to you by your health care provider. Make sure you discuss any questions you have with your health care provider. Document Revised: 08/01/2017 Document Reviewed: 10/30/2016 Elsevier Patient Education  2020 Else[employer] Inc.  

## 2019-10-14 ENCOUNTER — Ambulatory Visit: Payer: Medicare PPO | Attending: Internal Medicine

## 2019-10-14 DIAGNOSIS — Z23 Encounter for immunization: Secondary | ICD-10-CM | POA: Insufficient documentation

## 2019-10-14 NOTE — Progress Notes (Signed)
   Covid-19 Vaccination Clinic  Name:  Kelly Lewis    MRN: [id number] DOB: 1951-07-27  10/14/2019  Kelly Lewis was observed post Covid-19 immunization for 15 minutes without incidence. She was provided with Vaccine Information Sheet and instruction to access the V-Safe system.   Kelly Lewis was instructed to call 911 with any severe reactions post vaccine: Marland Kitchen Difficulty breathing  . Swelling of your face and throat  . A fast heartbeat  . A bad rash all over your body  . Dizziness and weakness    Immunizations Administered    Name Date Dose VIS Date Route   Pfizer COVID-19 Vaccine 10/14/2019  2:29 PM 0.3 mL 08/13/2019 Intramuscular   Manufacturer: [place]   Lot: [id number]   [place]: [id number]

## 2019-11-23 ENCOUNTER — Ambulatory Visit: Payer: Medicare PPO | Admitting: Nurse Practitioner

## 2019-11-23 ENCOUNTER — Ambulatory Visit (INDEPENDENT_AMBULATORY_CARE_PROVIDER_SITE_OTHER): Payer: Medicare PPO

## 2019-11-23 ENCOUNTER — Encounter: Payer: Self-pay | Admitting: Nurse Practitioner

## 2019-11-23 ENCOUNTER — Other Ambulatory Visit: Payer: Self-pay

## 2019-11-23 VITALS — BP 132/75 | HR 90 | Temp 98.0°F | Resp 20 | Ht 66.0 in | Wt 205.0 lb

## 2019-11-23 DIAGNOSIS — F411 Generalized anxiety disorder: Secondary | ICD-10-CM

## 2019-11-23 DIAGNOSIS — Z0001 Encounter for general adult medical examination with abnormal findings: Secondary | ICD-10-CM | POA: Diagnosis not present

## 2019-11-23 DIAGNOSIS — Z6834 Body mass index (BMI) 34.0-34.9, adult: Secondary | ICD-10-CM | POA: Diagnosis not present

## 2019-11-23 DIAGNOSIS — R6889 Other general symptoms and signs: Secondary | ICD-10-CM | POA: Diagnosis not present

## 2019-11-23 DIAGNOSIS — Z0000 Encounter for general adult medical examination without abnormal findings: Secondary | ICD-10-CM | POA: Diagnosis not present

## 2019-11-23 DIAGNOSIS — I10 Essential (primary) hypertension: Secondary | ICD-10-CM

## 2019-11-23 DIAGNOSIS — I700 Atherosclerosis of aorta: Secondary | ICD-10-CM | POA: Diagnosis not present

## 2019-11-23 DIAGNOSIS — Z780 Asymptomatic menopausal state: Secondary | ICD-10-CM

## 2019-11-23 DIAGNOSIS — Z13820 Encounter for screening for osteoporosis: Secondary | ICD-10-CM

## 2019-11-23 DIAGNOSIS — M85852 Other specified disorders of bone density and structure, left thigh: Secondary | ICD-10-CM | POA: Diagnosis not present

## 2019-11-23 DIAGNOSIS — K219 Gastro-esophageal reflux disease without esophagitis: Secondary | ICD-10-CM | POA: Diagnosis not present

## 2019-11-23 LAB — URINALYSIS
Bilirubin, UA: NEGATIVE
Glucose, UA: NEGATIVE
Ketones, UA: NEGATIVE
Nitrite, UA: POSITIVE — AB
Protein,UA: NEGATIVE
Specific Gravity, UA: 1.015 (ref 1.005–1.030)
Urobilinogen, Ur: 1 mg/dL (ref 0.2–1.0)
pH, UA: 6.5 (ref 5.0–7.5)

## 2019-11-23 NOTE — Patient Instructions (Signed)
Exercising to Stay Healthy To become healthy and stay healthy, it is recommended that you do moderate-intensity and vigorous-intensity exercise. You can tell that you are exercising at a moderate intensity if your heart starts beating faster and you start breathing faster but can still hold a conversation. You can tell that you are exercising at a vigorous intensity if you are breathing much harder and faster and cannot hold a conversation while exercising. Exercising regularly is important. It has many health benefits, such as:  Improving overall fitness, flexibility, and endurance.  Increasing bone density.  Helping with weight control.  Decreasing body fat.  Increasing muscle strength.  Reducing stress and tension.  Improving overall health. How often should I exercise? Choose an activity that you enjoy, and set realistic goals. Your health care provider can help you make an activity plan that works for you. Exercise regularly as told by your health care provider. This may include:  Doing strength training two times a week, such as: ? Lifting weights. ? Using resistance bands. ? Push-ups. ? Sit-ups. ? Yoga.  Doing a certain intensity of exercise for a given amount of time. Choose from these options: ? A total of 150 minutes of moderate-intensity exercise every week. ? A total of 75 minutes of vigorous-intensity exercise every week. ? A mix of moderate-intensity and vigorous-intensity exercise every week. Children, pregnant women, people who have not exercised regularly, people who are overweight, and older adults may need to talk with a health care provider about what activities are safe to do. If you have a medical condition, be sure to talk with your health care provider before you start a new exercise program. What are some exercise ideas? Moderate-intensity exercise ideas include:  Walking 1 mile (1.6 km) in about 15  minutes.  Biking.  Hiking.  Golfing.  Dancing.  Water aerobics. Vigorous-intensity exercise ideas include:  Walking 4.5 miles (7.2 km) or more in about 1 hour.  Jogging or running 5 miles (8 km) in about 1 hour.  Biking 10 miles (16.1 km) or more in about 1 hour.  Lap swimming.  Roller-skating or in-line skating.  Cross-country skiing.  Vigorous competitive sports, such as football, basketball, and soccer.  Jumping rope.  Aerobic dancing. What are some everyday activities that can help me to get exercise?  Yard work, such as: ? Pushing a lawn mower. ? Raking and bagging leaves.  Washing your car.  Pushing a stroller.  Shoveling snow.  Gardening.  Washing windows or floors. How can I be more active in my day-to-day activities?  Use stairs instead of an elevator.  Take a walk during your lunch break.  If you drive, park your car farther away from your work or school.  If you take public transportation, get off one stop early and walk the rest of the way.  Stand up or walk around during all of your indoor phone calls.  Get up, stretch, and walk around every 30 minutes throughout the day.  Enjoy exercise with a friend. Support to continue exercising will help you keep a regular routine of activity. What guidelines can I follow while exercising?  Before you start a new exercise program, talk with your health care provider.  Do not exercise so much that you hurt yourself, feel dizzy, or get very short of breath.  Wear comfortable clothes and wear shoes with good support.  Drink plenty of water while you exercise to prevent dehydration or heat stroke.  Work out until your breathing   and your heartbeat get faster. Where to find more information  [place] Department of Health and Human Services: www.hhs.gov  Centers for Disease Control and Prevention (CDC): www.cdc.gov Summary  Exercising regularly is important. It will improve your overall fitness,  flexibility, and endurance.  Regular exercise also will improve your overall health. It can help you control your weight, reduce stress, and improve your bone density.  Do not exercise so much that you hurt yourself, feel dizzy, or get very short of breath.  Before you start a new exercise program, talk with your health care provider. This information is not intended to replace advice given to you by your health care provider. Make sure you discuss any questions you have with your health care provider. Document Revised: 08/01/2017 Document Reviewed: 07/10/2017 Elsevier Patient Education  2020 Elsevier Inc.  

## 2019-11-23 NOTE — Progress Notes (Signed)
Subjective:    Patient ID: Kelly Lewis, female    DOB: [birth year], 69 y.o.   MRN: [id number]   Chief Complaint: Annual Exam    HPI:  1. Annual physical exam   2. Essential hypertension Checks BP at home daily and is good. Watches sodium intake. Denies chest pain, sob, headaches. BP Readings from Last 3 Encounters:  10/08/19 137/67  07/06/19 124/65  05/25/19 120/68    3. Gastroesophageal reflux disease without esophagitis Prilosec is working well.  4. GAD (generalized anxiety disorder) Usually only takes one a day unless she is unable to sleep. GAD 7 : Generalized Anxiety Score 11/23/2019  Nervous, Anxious, on Edge 0  Control/stop worrying 0  Worry too much - different things 0  Trouble relaxing 0  Restless 0  Easily annoyed or irritable 0  Afraid - awful might happen 0  Total GAD 7 Score 0  Anxiety Difficulty Not difficult at all     5. BMI 34.0-34.9,adult Has lost about 20 pounds in the last year with lifestyle changes. BMI Readings from Last 3 Encounters:  11/23/19 33.09 kg/m  10/08/19 33.73 kg/m  07/06/19 34.06 kg/m   Wt Readings from Last 3 Encounters:  11/23/19 205 lb (93 kg)  10/08/19 209 lb (94.8 kg)  07/06/19 211 lb (95.7 kg)      Outpatient Encounter Medications as of 11/23/2019  Medication Sig  . Cholecalciferol (VITAMIN D3) 2000 UNITS TABS Take by mouth.  Marland Kitchen ibuprofen (ADVIL,MOTRIN) 200 MG tablet Take 200 mg by mouth every 6 (six) hours as needed.  Marland Kitchen levothyroxine (SYNTHROID) 100 MCG tablet Take 1 tablet (100 mcg total) by mouth daily.  Marland Kitchen lisinopril (ZESTRIL) 10 MG tablet Take 1 tablet (10 mg total) by mouth daily.  Marland Kitchen LORazepam (ATIVAN) 0.5 MG tablet Take 1 tablet (0.5 mg total) by mouth 2 (two) times daily as needed.  Marland Kitchen omeprazole (PRILOSEC) 20 MG capsule Take 1 capsule (20 mg total) by mouth daily.   No facility-administered encounter medications on file as of 11/23/2019.    Past Surgical History:  Procedure Laterality Date  .  bladder tacked      Family History  Problem Relation Age of Onset  . Diabetes Mother   . Pneumonia Father   . Cancer Father        skin  . Cancer Sister        skin    New complaints: None  Social history: Lives with husband of 63 years.   Controlled substance contract: 06/23/2019     Review of Systems  Constitutional: Negative.   HENT: Negative.   Eyes: Negative.   Respiratory: Negative.   Cardiovascular: Negative.   Gastrointestinal: Negative.   Endocrine: Negative.   Genitourinary: Negative.   Skin: Negative.   Neurological: Negative.   Hematological: Negative.   Psychiatric/Behavioral: Negative.        Objective:   Physical Exam Vitals and nursing note reviewed. Exam conducted with a chaperone present.  HENT:     Head: Normocephalic.     Right Ear: Tympanic membrane normal.     Left Ear: Tympanic membrane normal.     Nose: Nose normal.     Mouth/Throat:     Mouth: Mucous membranes are moist.     Pharynx: Oropharynx is clear.  Eyes:     Conjunctiva/sclera: Conjunctivae normal.     Pupils: Pupils are equal, round, and reactive to light.  Cardiovascular:     Rate and Rhythm: Normal rate and regular rhythm.  Pulses: Normal pulses.     Heart sounds: Murmur (1/6) present.  Pulmonary:     Effort: Pulmonary effort is normal.     Breath sounds: Normal breath sounds.  Abdominal:     General: Bowel sounds are normal.     Palpations: Abdomen is soft.  Genitourinary:    Exam position: Lithotomy position.     Vagina: Normal.     Cervix: Normal.     Adnexa: Right adnexa normal.     Rectum: Normal.     Comments: Large anterior cystocele Musculoskeletal:        General: Normal range of motion.     Cervical back: Normal range of motion and neck supple.  Skin:    General: Skin is warm and dry.     Capillary Refill: Capillary refill takes less than 2 seconds.  Neurological:     Mental Status: She is alert and oriented to person, place, and time.   Psychiatric:        Mood and Affect: Mood normal.        Behavior: Behavior normal.    BP 132/75   Pulse 90   Temp 98 F (36.7 C) (Temporal)   Resp 20   Ht _0  (1.676 m)   Wt 205 lb (93 kg)   SpO2 98%   BMI 33.09 kg/m         Assessment & Plan:  GAVRIELA Lewis comes in today with chief complaint of Annual Exam   Diagnosis and orders addressed:  1. Annual physical exam  - Urinalysis - Urine Culture  2. Essential hypertension Low sodium diet. Check BP daily. - DG Chest 2 View; Future - CBC with Differential/Platelet - CMP14+EGFR - Lipid panel - Thyroid Panel With TSH  3. Gastroesophageal reflux disease without esophagitis Avoid spicy foods Do not eat 2 hours prior to bedtime   4. GAD (generalized anxiety disorder) Ativan PRN. Stress management  5. BMI 34.0-34.9,adult Discussed diet and exercise for person with BMI >25 Will recheck weight in 3-6 months  6. Screening for osteoporosis - DG [facility] DEXA   Labs pending Health Maintenance reviewed Diet and exercise encouraged  Follow up plan: 6 months   Mary-Margaret Hassell Done, FNP

## 2019-11-24 LAB — CBC WITH DIFFERENTIAL/PLATELET
Basophils Absolute: 0 10*3/uL (ref 0.0–0.2)
Basos: 1 %
EOS (ABSOLUTE): 0 10*3/uL (ref 0.0–0.4)
Eos: 0 %
Hematocrit: 39.5 % (ref 34.0–46.6)
Hemoglobin: 13.3 g/dL (ref 11.1–15.9)
Immature Grans (Abs): 0 10*3/uL (ref 0.0–0.1)
Immature Granulocytes: 0 %
Lymphocytes Absolute: 1.2 10*3/uL (ref 0.7–3.1)
Lymphs: 24 %
MCH: 30.2 pg (ref 26.6–33.0)
MCHC: 33.7 g/dL (ref 31.5–35.7)
MCV: 90 fL (ref 79–97)
Monocytes Absolute: 0.3 10*3/uL (ref 0.1–0.9)
Monocytes: 7 %
Neutrophils Absolute: 3.2 10*3/uL (ref 1.4–7.0)
Neutrophils: 68 %
Platelets: 102 10*3/uL — ABNORMAL LOW (ref 150–450)
RBC: 4.41 x10E6/uL (ref 3.77–5.28)
RDW: 13.2 % (ref 11.7–15.4)
WBC: 4.7 10*3/uL (ref 3.4–10.8)

## 2019-11-24 LAB — CMP14+EGFR
ALT: 21 IU/L (ref 0–32)
AST: 31 IU/L (ref 0–40)
Albumin/Globulin Ratio: 1.6 (ref 1.2–2.2)
Albumin: 4.2 g/dL (ref 3.8–4.8)
Alkaline Phosphatase: 203 IU/L — ABNORMAL HIGH (ref 39–117)
BUN/Creatinine Ratio: 9 — ABNORMAL LOW (ref 12–28)
BUN: 7 mg/dL — ABNORMAL LOW (ref 8–27)
Bilirubin Total: 0.6 mg/dL (ref 0.0–1.2)
CO2: 24 mmol/L (ref 20–29)
Calcium: 9.9 mg/dL (ref 8.7–10.3)
Chloride: 101 mmol/L (ref 96–106)
Creatinine, Ser: 0.81 mg/dL (ref 0.57–1.00)
GFR calc Af Amer: 86 mL/min/{1.73_m2} (ref >59)
GFR calc non Af Amer: 74 mL/min/{1.73_m2} (ref >59)
Globulin, Total: 2.6 g/dL (ref 1.5–4.5)
Glucose: 94 mg/dL (ref 65–99)
Potassium: 4.6 mmol/L (ref 3.5–5.2)
Sodium: 136 mmol/L (ref 134–144)
Total Protein: 6.8 g/dL (ref 6.0–8.5)

## 2019-11-24 LAB — LIPID PANEL
Chol/HDL Ratio: 3.7 ratio (ref 0.0–4.4)
Cholesterol, Total: 198 mg/dL (ref 100–199)
HDL: 53 mg/dL (ref >39)
LDL Chol Calc (NIH): 129 mg/dL — ABNORMAL HIGH (ref 0–99)
Triglycerides: 91 mg/dL (ref 0–149)
VLDL Cholesterol Cal: 16 mg/dL (ref 5–40)

## 2019-11-24 LAB — THYROID PANEL WITH TSH
Free Thyroxine Index: 2.7 (ref 1.2–4.9)
T3 Uptake Ratio: 22 % — ABNORMAL LOW (ref 24–39)
T4, Total: 12.3 ug/dL — ABNORMAL HIGH (ref 4.5–12.0)
TSH: 1.31 u[IU]/mL (ref 0.450–4.500)

## 2019-11-24 LAB — PAP IG (IMAGE GUIDED)

## 2019-11-26 ENCOUNTER — Encounter: Payer: Self-pay | Admitting: Nurse Practitioner

## 2019-11-26 LAB — URINE CULTURE

## 2019-11-26 MED ORDER — AMOXICILLIN-POT CLAVULANATE 875-125 MG PO TABS: 1.0000 | ORAL_TABLET | Freq: Two times a day (BID) | ORAL | 0 refills | Status: DC

## 2019-11-26 NOTE — Addendum Note (Signed)
Addended by: Chevis Pretty on: 11/26/2019 11:35 AM   Modules accepted: Orders

## 2019-11-29 ENCOUNTER — Encounter: Payer: Self-pay | Admitting: Nurse Practitioner

## 2019-11-29 ENCOUNTER — Other Ambulatory Visit: Payer: Self-pay | Admitting: Nurse Practitioner

## 2019-11-29 ENCOUNTER — Telehealth: Payer: Self-pay | Admitting: Nurse Practitioner

## 2019-11-29 DIAGNOSIS — F411 Generalized anxiety disorder: Secondary | ICD-10-CM

## 2019-11-29 MED ORDER — LORAZEPAM 0.5 MG PO TABS: 0.5000 mg | ORAL_TABLET | Freq: Two times a day (BID) | ORAL | 5 refills | Status: DC | PRN

## 2019-11-30 ENCOUNTER — Other Ambulatory Visit: Payer: Self-pay | Admitting: Nurse Practitioner

## 2019-11-30 DIAGNOSIS — F411 Generalized anxiety disorder: Secondary | ICD-10-CM

## 2019-11-30 MED ORDER — LORAZEPAM 0.5 MG PO TABS: 0.5000 mg | ORAL_TABLET | Freq: Two times a day (BID) | ORAL | 5 refills | Status: DC | PRN

## 2019-12-08 ENCOUNTER — Emergency Department (HOSPITAL_COMMUNITY)
Admission: EM | Admit: 2019-12-08 | Discharge: 2019-12-08 | Disposition: A | Discharge status: Home / Self Care | Payer: Medicare PPO | Attending: Emergency Medicine | Admitting: Emergency Medicine

## 2019-12-08 ENCOUNTER — Encounter (HOSPITAL_COMMUNITY): Payer: Self-pay | Admitting: Emergency Medicine

## 2019-12-08 ENCOUNTER — Other Ambulatory Visit: Payer: Self-pay

## 2019-12-08 DIAGNOSIS — N8110 Cystocele, unspecified: Secondary | ICD-10-CM | POA: Diagnosis not present

## 2019-12-08 DIAGNOSIS — I10 Essential (primary) hypertension: Secondary | ICD-10-CM | POA: Diagnosis not present

## 2019-12-08 DIAGNOSIS — R39198 Other difficulties with micturition: Secondary | ICD-10-CM | POA: Diagnosis present

## 2019-12-08 DIAGNOSIS — Z79899 Other long term (current) drug therapy: Secondary | ICD-10-CM | POA: Insufficient documentation

## 2019-12-08 DIAGNOSIS — R339 Retention of urine, unspecified: Secondary | ICD-10-CM | POA: Diagnosis not present

## 2019-12-08 DIAGNOSIS — E079 Disorder of thyroid, unspecified: Secondary | ICD-10-CM | POA: Diagnosis not present

## 2019-12-08 LAB — CBC WITH DIFFERENTIAL/PLATELET
Abs Immature Granulocytes: 0.01 10*3/uL (ref 0.00–0.07)
Basophils Absolute: 0 10*3/uL (ref 0.0–0.1)
Basophils Relative: 1 %
Eosinophils Absolute: 0 10*3/uL (ref 0.0–0.5)
Eosinophils Relative: 1 %
HCT: 40.2 % (ref 36.0–46.0)
Hemoglobin: 13.3 g/dL (ref 12.0–15.0)
Immature Granulocytes: 0 %
Lymphocytes Relative: 24 %
Lymphs Abs: 1.2 10*3/uL (ref 0.7–4.0)
MCH: 29.5 pg (ref 26.0–34.0)
MCHC: 33.1 g/dL (ref 30.0–36.0)
MCV: 89.1 fL (ref 80.0–100.0)
Monocytes Absolute: 0.4 10*3/uL (ref 0.1–1.0)
Monocytes Relative: 7 %
Neutro Abs: 3.3 10*3/uL (ref 1.7–7.7)
Neutrophils Relative %: 67 %
Platelets: 103 10*3/uL — ABNORMAL LOW (ref 150–400)
RBC: 4.51 MIL/uL (ref 3.87–5.11)
RDW: 12.9 % (ref 11.5–15.5)
WBC: 4.9 10*3/uL (ref 4.0–10.5)
nRBC: 0 % (ref 0.0–0.2)

## 2019-12-08 LAB — COMPREHENSIVE METABOLIC PANEL
ALT: 22 U/L (ref 0–44)
AST: 31 U/L (ref 15–41)
Albumin: 4.2 g/dL (ref 3.5–5.0)
Alkaline Phosphatase: 164 U/L — ABNORMAL HIGH (ref 38–126)
Anion gap: 10 (ref 5–15)
BUN: 5 mg/dL — ABNORMAL LOW (ref 8–23)
CO2: 24 mmol/L (ref 22–32)
Calcium: 9.7 mg/dL (ref 8.9–10.3)
Chloride: 95 mmol/L — ABNORMAL LOW (ref 98–111)
Creatinine, Ser: 0.76 mg/dL (ref 0.44–1.00)
GFR calc Af Amer: >60 mL/min (ref >60)
GFR calc non Af Amer: >60 mL/min (ref >60)
Glucose, Bld: 112 mg/dL — ABNORMAL HIGH (ref 70–99)
Potassium: 4.3 mmol/L (ref 3.5–5.1)
Sodium: 129 mmol/L — ABNORMAL LOW (ref 135–145)
Total Bilirubin: 0.7 mg/dL (ref 0.3–1.2)
Total Protein: 7.4 g/dL (ref 6.5–8.1)

## 2019-12-08 LAB — URINALYSIS, ROUTINE W REFLEX MICROSCOPIC
Bacteria, UA: NONE SEEN
Bilirubin Urine: NEGATIVE
Glucose, UA: NEGATIVE mg/dL
Ketones, ur: NEGATIVE mg/dL
Nitrite: NEGATIVE
Protein, ur: NEGATIVE mg/dL
Specific Gravity, Urine: 1.006 (ref 1.005–1.030)
pH: 6 (ref 5.0–8.0)

## 2019-12-08 NOTE — ED Triage Notes (Signed)
Pt states she is been having a UTI and is unable to take abx as prescribed by pcp, today she is having difficulty urinating.

## 2019-12-08 NOTE — ED Provider Notes (Signed)
[facility] EMERGENCY DEPARTMENT Provider Note   CSN: [id number] Arrival date & time: 12/08/19  0554     History Chief Complaint  Patient presents with  . Urinary Tract Infection    Kelly Lewis is a 69 y.o. female.  HPI She presents for evaluation of a sensation of difficulty urinating.  She typically gets up multiple times during the night to urinate but last night only got up once.  This concerned her, as well as only being able to urinate a small amount, this morning.  She again urinated in the ED to be sent a urine sample.  She states that this was also "a small amount."  She has been taking Augmentin for UTI, diagnosed about a week ago.  The Augmentin upset her stomach so she only took one a day.  She complains of anorexia while taking the Augmentin.  She denies fever, chills, cough, shortness of breath, weakness or dizziness.  She states that she has a "bladder prolapse," followed by her PCP.  She had a bladder tacking procedure done about 25 years ago.  She does not currently see a gynecologist or urologist.  She states that she can feel the bladder inside her vagina, but it does not "stick out."  There are no other known modifying factors.    Past Medical History:  Diagnosis Date  . Chronic anxiety   . GERD (gastroesophageal reflux disease)   . H/O degenerative disc disease   . Hypertension   . Plantar fasciitis   . Thyroid disease     Patient Active Problem List   Diagnosis Date Noted  . BMI 34.0-34.9,adult 03/28/2015  . GAD (generalized anxiety disorder) 01/07/2013  . Hypertension 01/07/2013  . GERD (gastroesophageal reflux disease) 01/07/2013    Past Surgical History:  Procedure Laterality Date  . bladder tacked       OB History   No obstetric history on file.     Family History  Problem Relation Age of Onset  . Diabetes Mother   . Pneumonia Father   . Cancer Father        skin  . Cancer Sister        skin    Social History    Tobacco Use  . Smoking status: Never Smoker  . Smokeless tobacco: Never Used  Substance Use Topics  . Alcohol use: No  . Drug use: No    Home Medications Prior to Admission medications   Medication Sig Start Date End Date Taking? Authorizing Provider  Cholecalciferol (VITAMIN D3) 2000 UNITS TABS Take 2,000 Units by mouth daily.    Yes [provider]  ibuprofen (ADVIL,MOTRIN) 200 MG tablet Take 200 mg by mouth every 6 (six) hours as needed for mild pain.    Yes [provider]  levothyroxine (SYNTHROID) 100 MCG tablet Take 1 tablet (100 mcg total) by mouth daily. 05/25/19  Yes Martin, Mary-Margaret, FNP  lisinopril (ZESTRIL) 10 MG tablet Take 1 tablet (10 mg total) by mouth daily. Patient taking differently: Take 5 mg by mouth daily.  05/25/19  Yes Martin, Mary-Margaret, FNP  LORazepam (ATIVAN) 0.5 MG tablet Take 1 tablet (0.5 mg total) by mouth 2 (two) times daily as needed. Patient taking differently: Take 0.5 mg by mouth 2 (two) times daily as needed for anxiety.  11/30/19  Yes Martin, Mary-Margaret, FNP  omeprazole (PRILOSEC) 20 MG capsule Take 1 capsule (20 mg total) by mouth daily. 05/25/19  Yes Chevis Pretty, FNP    Allergies  Ciprofloxacin hcl, Naprosyn [naproxen], Shingrix [zoster vac recomb adjuvanted], Sulfonamide derivatives, and Xylocaine [lidocaine hcl]  Review of Systems   Review of Systems  All other systems reviewed and are negative.   Physical Exam Updated Vital Signs BP (!) 160/73   Pulse 74   Temp 98.1 F (36.7 C) (Oral)   Resp 16   Ht '5\' 6"'$  (1.676 m)   Wt 93 kg   SpO2 99%   BMI 33.09 kg/m   Physical Exam Vitals and nursing note reviewed.  Constitutional:      Appearance: She is well-developed.  HENT:     Head: Normocephalic and atraumatic.     Right Ear: External ear normal.     Left Ear: External ear normal.  Eyes:     General:        Right eye: No discharge.        Left eye: No discharge.     Conjunctiva/sclera:  Conjunctivae normal.     Pupils: Pupils are equal, round, and reactive to light.  Neck:     Trachea: Phonation normal.  Cardiovascular:     Rate and Rhythm: Normal rate and regular rhythm.  Pulmonary:     Effort: Pulmonary effort is normal.     Breath sounds: Normal breath sounds.  Chest:     Chest wall: No tenderness.  Abdominal:     General: There is no distension.     Palpations: Abdomen is soft.     Tenderness: There is no abdominal tenderness. There is no guarding.  Musculoskeletal:        General: Normal range of motion.     Cervical back: Normal range of motion and neck supple.  Skin:    General: Skin is warm and dry.  Neurological:     Mental Status: She is alert and oriented to person, place, and time.     Sensory: No sensory deficit.     Motor: No abnormal muscle tone.     Coordination: Coordination normal.  Psychiatric:        Mood and Affect: Mood normal.        Behavior: Behavior normal.        Thought Content: Thought content normal.        Judgment: Judgment normal.     ED Results / Procedures / Treatments   Labs (all labs ordered are listed, but only abnormal results are displayed) Labs Reviewed  COMPREHENSIVE METABOLIC PANEL - Abnormal; Notable for the following components:      Result Value   Sodium 129 (*)    Chloride 95 (*)    Glucose, Bld 112 (*)    BUN 5 (*)    Alkaline Phosphatase 164 (*)    All other components within normal limits  URINALYSIS, ROUTINE W REFLEX MICROSCOPIC - Abnormal; Notable for the following components:   Hgb urine dipstick SMALL (*)    Leukocytes,Ua TRACE (*)    All other components within normal limits  CBC WITH DIFFERENTIAL/PLATELET    EKG None  Radiology No results found.  Procedures Procedures (including critical care time)  Medications Ordered in ED Medications - No data to display  ED Course  I have reviewed the triage vital signs and the nursing notes.  Pertinent labs & imaging results that were  available during my care of the patient were reviewed by me and considered in my medical decision making (see chart for details).  Clinical Course as of Dec 08 906  Wed Dec 08, 2019  0819 Normal except presence of small amount of hemoglobin, trace leukocytes  Urinalysis, Routine w reflex microscopic(!) [EW]  0820 Normal except sodium low, chloride low, glucose high, BUN low, alkaline phosphatase high  Comprehensive metabolic panel(!) [EW]  7076 Urinary bladder scanning done, shows 236 mL urine in the urinary bladder.  This is abnormal, increased, post void residual.   [EW]  0902 Patient went to the bathroom and states she was able to void, and feels better.  This urine output was not measured.   [EW]    Clinical Course User Index [EW] Daleen Bo, MD   MDM Rules/Calculators/A&P                       Patient Vitals for the past 24 hrs:  BP Temp Temp src Pulse Resp SpO2 Height Weight  12/08/19 0815 (!) 160/73 -- -- 74 16 99 % -- --  12/08/19 0809 (!) 162/83 -- -- 79 20 99 % -- --  12/08/19 0604 (!) 158/82 98.1 F (36.7 C) Oral 86 16 100 % -- --  12/08/19 0601 -- -- -- -- -- -- '5\' 6"'$  (1.676 m) 93 kg    9:05 AM Reevaluation with update and discussion. After initial assessment and treatment, an updated evaluation reveals patient feeling better after voiding twice in the emergency department.  Findings discussed with patient and her husband, at the bedside, all questions were answered. Daleen Bo   Medical Decision Making:  This patient is presenting for evaluation of difficulty urinating, which did require a range of treatment options, and is a complaint that involves a moderate risk of morbidity and mortality. The differential diagnoses include urinary tract infection, urinary bladder outlet obstruction, metabolic instability, hemodynamic instability. I decided  to review old records, and in summary she was treated for UTI recently based on abnormal urine, urine culture grew E.  coli, sensitive to Augmentin, which she was treated with. I got additional historical information from her husband.  She has a longstanding history of urinary bladder prolapse, reported to be worsening by her PCP Clinical Laboratory Tests Ordered, included urinalysis, CBC, c-Met. Radiologic Tests Ordered, included none.   Critical Interventions-urinary bladder scanning, shows retained urine, 236 mL after voiding.  Patient was able to again void after this and get some more urine out, after coaching, by me  After These Interventions, the Patient was reevaluated and was found improved  CRITICAL CARE-no Performed by: Daleen Bo   Final Clinical Impression(s) / ED Diagnoses Final diagnoses:  Urinary retention  Bladder prolapse, female, acquired    Rx / DC Orders ED Discharge Orders    None       Daleen Bo, MD 12/08/19 [phone]

## 2019-12-08 NOTE — ED Notes (Signed)
Bladder scan patient I got 236 ml of urine in the bladder patient is resting with call bell in reach and family at bedside

## 2019-12-08 NOTE — ED Notes (Signed)
Walked patient to the bathroom patient did well 

## 2019-12-08 NOTE — Discharge Instructions (Addendum)
Try to eat and drink regularly.  Do not take any more Augmentin, since your urinary tract infection has resolved.  Call the urologist, Dr. Alinda Money for appointment to be seen about the urinary bladder prolapse.  Make sure that you are relaxed when you try to urinate and if needed, push your bladder up to help you urinate.

## 2019-12-13 DIAGNOSIS — N3000 Acute cystitis without hematuria: Secondary | ICD-10-CM | POA: Diagnosis not present

## 2019-12-13 DIAGNOSIS — R3914 Feeling of incomplete bladder emptying: Secondary | ICD-10-CM | POA: Diagnosis not present

## 2019-12-13 DIAGNOSIS — N8111 Cystocele, midline: Secondary | ICD-10-CM | POA: Diagnosis not present

## 2019-12-27 DIAGNOSIS — N3000 Acute cystitis without hematuria: Secondary | ICD-10-CM | POA: Diagnosis not present

## 2019-12-27 DIAGNOSIS — N8111 Cystocele, midline: Secondary | ICD-10-CM | POA: Diagnosis not present

## 2019-12-27 DIAGNOSIS — R3914 Feeling of incomplete bladder emptying: Secondary | ICD-10-CM | POA: Diagnosis not present

## 2019-12-29 ENCOUNTER — Other Ambulatory Visit: Payer: Self-pay

## 2019-12-29 ENCOUNTER — Inpatient Hospital Stay (HOSPITAL_COMMUNITY)
Admission: EM | Admit: 2019-12-29 | Discharge: 2019-12-31 | DRG: 690 | Disposition: A | Discharge status: Home / Self Care | Payer: Medicare PPO | Attending: Internal Medicine | Admitting: Internal Medicine

## 2019-12-29 ENCOUNTER — Encounter (HOSPITAL_COMMUNITY): Payer: Self-pay | Admitting: *Deleted

## 2019-12-29 DIAGNOSIS — B9620 Unspecified Escherichia coli [E. coli] as the cause of diseases classified elsewhere: Secondary | ICD-10-CM | POA: Diagnosis present

## 2019-12-29 DIAGNOSIS — D696 Thrombocytopenia, unspecified: Secondary | ICD-10-CM

## 2019-12-29 DIAGNOSIS — Z6831 Body mass index (BMI) 31.0-31.9, adult: Secondary | ICD-10-CM | POA: Diagnosis not present

## 2019-12-29 DIAGNOSIS — R531 Weakness: Secondary | ICD-10-CM | POA: Diagnosis not present

## 2019-12-29 DIAGNOSIS — E6609 Other obesity due to excess calories: Secondary | ICD-10-CM | POA: Diagnosis not present

## 2019-12-29 DIAGNOSIS — E871 Hypo-osmolality and hyponatremia: Secondary | ICD-10-CM | POA: Diagnosis present

## 2019-12-29 DIAGNOSIS — N3000 Acute cystitis without hematuria: Secondary | ICD-10-CM | POA: Diagnosis not present

## 2019-12-29 DIAGNOSIS — I10 Essential (primary) hypertension: Secondary | ICD-10-CM

## 2019-12-29 DIAGNOSIS — E039 Hypothyroidism, unspecified: Secondary | ICD-10-CM | POA: Diagnosis present

## 2019-12-29 DIAGNOSIS — E878 Other disorders of electrolyte and fluid balance, not elsewhere classified: Secondary | ICD-10-CM | POA: Diagnosis present

## 2019-12-29 DIAGNOSIS — E669 Obesity, unspecified: Secondary | ICD-10-CM | POA: Diagnosis present

## 2019-12-29 DIAGNOSIS — N390 Urinary tract infection, site not specified: Principal | ICD-10-CM | POA: Diagnosis present

## 2019-12-29 DIAGNOSIS — K5900 Constipation, unspecified: Secondary | ICD-10-CM | POA: Diagnosis present

## 2019-12-29 DIAGNOSIS — Z20822 Contact with and (suspected) exposure to covid-19: Secondary | ICD-10-CM | POA: Diagnosis present

## 2019-12-29 DIAGNOSIS — R0689 Other abnormalities of breathing: Secondary | ICD-10-CM | POA: Diagnosis not present

## 2019-12-29 DIAGNOSIS — R000 Tachycardia, unspecified: Secondary | ICD-10-CM | POA: Diagnosis not present

## 2019-12-29 DIAGNOSIS — Z79899 Other long term (current) drug therapy: Secondary | ICD-10-CM | POA: Diagnosis not present

## 2019-12-29 DIAGNOSIS — Z79890 Hormone replacement therapy (postmenopausal): Secondary | ICD-10-CM

## 2019-12-29 DIAGNOSIS — T887XXA Unspecified adverse effect of drug or medicament, initial encounter: Secondary | ICD-10-CM

## 2019-12-29 DIAGNOSIS — E860 Dehydration: Secondary | ICD-10-CM | POA: Diagnosis present

## 2019-12-29 DIAGNOSIS — F411 Generalized anxiety disorder: Secondary | ICD-10-CM | POA: Diagnosis present

## 2019-12-29 DIAGNOSIS — K219 Gastro-esophageal reflux disease without esophagitis: Secondary | ICD-10-CM | POA: Diagnosis present

## 2019-12-29 DIAGNOSIS — Z1623 Resistance to quinolones and fluoroquinolones: Secondary | ICD-10-CM | POA: Diagnosis present

## 2019-12-29 LAB — COMPREHENSIVE METABOLIC PANEL
ALT: 19 U/L (ref 0–44)
AST: 24 U/L (ref 15–41)
Albumin: 4.2 g/dL (ref 3.5–5.0)
Alkaline Phosphatase: 157 U/L — ABNORMAL HIGH (ref 38–126)
Anion gap: 11 (ref 5–15)
BUN: 5 mg/dL — ABNORMAL LOW (ref 8–23)
CO2: 24 mmol/L (ref 22–32)
Calcium: 9.5 mg/dL (ref 8.9–10.3)
Chloride: 92 mmol/L — ABNORMAL LOW (ref 98–111)
Creatinine, Ser: 0.7 mg/dL (ref 0.44–1.00)
GFR calc Af Amer: >60 mL/min (ref >60)
GFR calc non Af Amer: >60 mL/min (ref >60)
Glucose, Bld: 111 mg/dL — ABNORMAL HIGH (ref 70–99)
Potassium: 4.7 mmol/L (ref 3.5–5.1)
Sodium: 127 mmol/L — ABNORMAL LOW (ref 135–145)
Total Bilirubin: 0.5 mg/dL (ref 0.3–1.2)
Total Protein: 7 g/dL (ref 6.5–8.1)

## 2019-12-29 LAB — CBC WITH DIFFERENTIAL/PLATELET
Abs Immature Granulocytes: 0.01 10*3/uL (ref 0.00–0.07)
Basophils Absolute: 0 10*3/uL (ref 0.0–0.1)
Basophils Relative: 0 %
Eosinophils Absolute: 0 10*3/uL (ref 0.0–0.5)
Eosinophils Relative: 0 %
HCT: 38.1 % (ref 36.0–46.0)
Hemoglobin: 12.6 g/dL (ref 12.0–15.0)
Immature Granulocytes: 0 %
Lymphocytes Relative: 19 %
Lymphs Abs: 0.9 10*3/uL (ref 0.7–4.0)
MCH: 29.6 pg (ref 26.0–34.0)
MCHC: 33.1 g/dL (ref 30.0–36.0)
MCV: 89.4 fL (ref 80.0–100.0)
Monocytes Absolute: 0.3 10*3/uL (ref 0.1–1.0)
Monocytes Relative: 6 %
Neutro Abs: 3.7 10*3/uL (ref 1.7–7.7)
Neutrophils Relative %: 75 %
Platelets: 101 10*3/uL — ABNORMAL LOW (ref 150–400)
RBC: 4.26 MIL/uL (ref 3.87–5.11)
RDW: 12.8 % (ref 11.5–15.5)
WBC: 5 10*3/uL (ref 4.0–10.5)
nRBC: 0 % (ref 0.0–0.2)

## 2019-12-29 LAB — URINALYSIS, ROUTINE W REFLEX MICROSCOPIC
Bilirubin Urine: NEGATIVE
Glucose, UA: NEGATIVE mg/dL
Ketones, ur: NEGATIVE mg/dL
Nitrite: NEGATIVE
Protein, ur: NEGATIVE mg/dL
Specific Gravity, Urine: 1.005 (ref 1.005–1.030)
pH: 7 (ref 5.0–8.0)

## 2019-12-29 LAB — HIV ANTIBODY (ROUTINE TESTING W REFLEX): HIV Screen 4th Generation wRfx: NONREACTIVE

## 2019-12-29 LAB — TSH: TSH: 1.217 u[IU]/mL (ref 0.350–4.500)

## 2019-12-29 LAB — SARS CORONAVIRUS 2 (TAT 6-24 HRS): SARS Coronavirus 2: NEGATIVE

## 2019-12-29 MED ORDER — SODIUM CHLORIDE 0.9 % IV SOLN: INTRAVENOUS | Status: DC

## 2019-12-29 MED ORDER — PHENAZOPYRIDINE HCL 100 MG PO TABS
100.0000 mg | ORAL_TABLET | Freq: Three times a day (TID) | ORAL | Status: AC
  Administered 2019-12-29 – 2019-12-31 (×6): 100 mg via ORAL
  Filled 2019-12-29 (×6): qty 1

## 2019-12-29 MED ORDER — LORAZEPAM 0.5 MG PO TABS
0.5000 mg | ORAL_TABLET | Freq: Two times a day (BID) | ORAL | Status: DC | PRN
  Administered 2019-12-29 – 2019-12-30 (×3): 0.5 mg via ORAL
  Filled 2019-12-29 (×3): qty 1

## 2019-12-29 MED ORDER — ONDANSETRON HCL 4 MG/2ML IJ SOLN
4.0000 mg | Freq: Four times a day (QID) | INTRAMUSCULAR | Status: DC | PRN
  Administered 2019-12-30 – 2019-12-31 (×2): 4 mg via INTRAVENOUS
  Filled 2019-12-29 (×2): qty 2

## 2019-12-29 MED ORDER — SODIUM CHLORIDE 0.9 % IV BOLUS
500.0000 mL | Freq: Once | INTRAVENOUS | Status: AC
  Administered 2019-12-29: 500 mL via INTRAVENOUS

## 2019-12-29 MED ORDER — ALPRAZOLAM 0.5 MG PO TABS
0.5000 mg | ORAL_TABLET | Freq: Once | ORAL | Status: AC
  Administered 2019-12-29: 0.5 mg via ORAL
  Filled 2019-12-29: qty 1

## 2019-12-29 MED ORDER — SODIUM CHLORIDE 0.9 % IV SOLN
1.0000 g | INTRAVENOUS | Status: DC
  Administered 2019-12-29 – 2019-12-31 (×3): 1 g via INTRAVENOUS
  Filled 2019-12-29 (×3): qty 10

## 2019-12-29 MED ORDER — ACETAMINOPHEN 325 MG PO TABS
650.0000 mg | ORAL_TABLET | Freq: Four times a day (QID) | ORAL | Status: DC | PRN
  Administered 2019-12-30 (×2): 650 mg via ORAL
  Filled 2019-12-29 (×4): qty 2

## 2019-12-29 MED ORDER — VITAMIN D 25 MCG (1000 UNIT) PO TABS
2000.0000 [IU] | ORAL_TABLET | Freq: Every day | ORAL | Status: DC
  Administered 2019-12-30 – 2019-12-31 (×2): 2000 [IU] via ORAL
  Filled 2019-12-29 (×4): qty 2

## 2019-12-29 MED ORDER — ACETAMINOPHEN 650 MG RE SUPP: 650.0000 mg | Freq: Four times a day (QID) | RECTAL | Status: DC | PRN

## 2019-12-29 MED ORDER — ONDANSETRON HCL 4 MG PO TABS: 4.0000 mg | ORAL_TABLET | Freq: Four times a day (QID) | ORAL | Status: DC | PRN

## 2019-12-29 MED ORDER — PANTOPRAZOLE SODIUM 40 MG PO TBEC
40.0000 mg | DELAYED_RELEASE_TABLET | Freq: Two times a day (BID) | ORAL | Status: DC
  Administered 2019-12-29 – 2019-12-31 (×5): 40 mg via ORAL
  Filled 2019-12-29 (×5): qty 1

## 2019-12-29 MED ORDER — LEVOTHYROXINE SODIUM 100 MCG PO TABS
100.0000 ug | ORAL_TABLET | Freq: Every day | ORAL | Status: DC
  Administered 2019-12-30 – 2019-12-31 (×2): 100 ug via ORAL
  Filled 2019-12-29 (×3): qty 1

## 2019-12-29 MED ORDER — LISINOPRIL 5 MG PO TABS
5.0000 mg | ORAL_TABLET | Freq: Every day | ORAL | Status: DC
  Administered 2019-12-29 – 2019-12-31 (×2): 5 mg via ORAL
  Filled 2019-12-29 (×3): qty 1

## 2019-12-29 NOTE — ED Notes (Signed)
Attempted report x 2 

## 2019-12-29 NOTE — ED Triage Notes (Addendum)
Pt brought in by [facility]. Pt recently placed on Amoxicillin for UTI. Pt started having abdominal pain and stopped the medication. When pt stopped the medication, the UTI came back. Pt was then placed on Keflex and had more abdominal pain and nausea. Pt was then placed on Doxycycline and started having severe head pressure after 5 days. Pt then placed on Nitrofurantoin and is now c/o burning in lower stomach that radiates up, burning in bilateral hands and wrists, pain/pressure in the back of her head. Pt does still c/o urinary symptoms of frequency and dysuria.

## 2019-12-29 NOTE — Discharge Instructions (Signed)
Gradually increase oral intake and follow-up culture results with primary doctor in 2 days. Return for new or worsening symptoms.

## 2019-12-29 NOTE — ED Provider Notes (Signed)
[place] Provider Note   CSN: [id number] Arrival date & time: 12/29/19  0732     History Chief Complaint  Patient presents with  . Allergic Reaction    Kelly Lewis is a 69 y.o. female.  Patient with history of anxiety, reflux, recently has taken multiple antibiotics including Augmentin followed by Keflex followed by nitrofurantoin over the past 4 weeks.  Patient has not tolerated any of them well and has had significant stomach issues, burning feeling in her extremities, headache and other nausea and symptoms.  Patient presents as she has had persistent symptoms since taking her first dose of nitrofurantoin on Monday.  Patient has burning in her stomach has history of reflux.  Patient has mild urinary symptoms.  Patient has mild pressure in her head as well.  Patient has not had fevers or vomiting.  No flank pain.  Patient is seeing a specialist.        Past Medical History:  Diagnosis Date  . Chronic anxiety   . GERD (gastroesophageal reflux disease)   . H/O degenerative disc disease   . Hypertension   . Plantar fasciitis   . Thyroid disease     Patient Active Problem List   Diagnosis Date Noted  . BMI 34.0-34.9,adult 03/28/2015  . GAD (generalized anxiety disorder) 01/07/2013  . Hypertension 01/07/2013  . GERD (gastroesophageal reflux disease) 01/07/2013    Past Surgical History:  Procedure Laterality Date  . bladder tacked       OB History   No obstetric history on file.     Family History  Problem Relation Age of Onset  . Diabetes Mother   . Pneumonia Father   . Cancer Father        skin  . Cancer Sister        skin    Social History   Tobacco Use  . Smoking status: Never Smoker  . Smokeless tobacco: Never Used  Substance Use Topics  . Alcohol use: No  . Drug use: No    Home Medications Prior to Admission medications   Medication Sig Start Date End Date Taking? Authorizing Provider  Cholecalciferol (VITAMIN D3)  2000 UNITS TABS Take 2,000 Units by mouth daily.    Yes [provider]  ibuprofen (ADVIL,MOTRIN) 200 MG tablet Take 200 mg by mouth every 6 (six) hours as needed for mild pain.    Yes [provider]  levothyroxine (SYNTHROID) 100 MCG tablet Take 1 tablet (100 mcg total) by mouth daily. 05/25/19  Yes Martin, Mary-Margaret, FNP  lisinopril (ZESTRIL) 10 MG tablet Take 1 tablet (10 mg total) by mouth daily. Patient taking differently: Take 5 mg by mouth daily.  05/25/19  Yes Martin, Mary-Margaret, FNP  LORazepam (ATIVAN) 0.5 MG tablet Take 1 tablet (0.5 mg total) by mouth 2 (two) times daily as needed. Patient taking differently: Take 0.5 mg by mouth 2 (two) times daily as needed for anxiety.  11/30/19  Yes Martin, Mary-Margaret, FNP  omeprazole (PRILOSEC) 20 MG capsule Take 1 capsule (20 mg total) by mouth daily. 05/25/19  Yes Martin, Mary-Margaret, FNP  ondansetron (ZOFRAN) 4 MG tablet Take 4 mg by mouth every 8 (eight) hours as needed. 12/27/19  Yes [provider]    Allergies    Ciprofloxacin hcl, Naprosyn [naproxen], Shingrix [zoster vac recomb adjuvanted], Sulfonamide derivatives, and Xylocaine [lidocaine hcl]  Review of Systems   Review of Systems  Constitutional: Positive for appetite change. Negative for fever.  HENT: Negative for congestion.  Eyes: Negative for visual disturbance.  Respiratory: Negative for shortness of breath.   Cardiovascular: Negative for chest pain.  Gastrointestinal: Positive for abdominal pain and nausea. Negative for vomiting.  Genitourinary: Positive for dysuria. Negative for flank pain.  Musculoskeletal: Positive for arthralgias. Negative for back pain, neck pain and neck stiffness.  Skin: Negative for rash.  Neurological: Positive for headaches. Negative for light-headedness.    Physical Exam Updated Vital Signs BP 139/71   Pulse 70   Ht 5' 6.5" (1.689 m)   Wt 88.5 kg   SpO2 96%   BMI 31.00 kg/m   Physical Exam Vitals  and nursing note reviewed.  Constitutional:      Appearance: She is well-developed.  HENT:     Head: Normocephalic and atraumatic.     Mouth/Throat:     Mouth: Mucous membranes are dry.  Eyes:     General:        Right eye: No discharge.        Left eye: No discharge.     Conjunctiva/sclera: Conjunctivae normal.  Neck:     Trachea: No tracheal deviation.  Cardiovascular:     Rate and Rhythm: Normal rate and regular rhythm.  Pulmonary:     Effort: Pulmonary effort is normal.     Breath sounds: Normal breath sounds.  Abdominal:     General: There is no distension.     Palpations: Abdomen is soft.     Tenderness: There is no abdominal tenderness. There is no guarding.  Musculoskeletal:        General: No swelling.     Cervical back: Normal range of motion and neck supple.  Skin:    General: Skin is warm.     Findings: No rash.  Neurological:     General: No focal deficit present.     Mental Status: She is alert and oriented to person, place, and time.     Cranial Nerves: No cranial nerve deficit.     ED Results / Procedures / Treatments   Labs (all labs ordered are listed, but only abnormal results are displayed) Labs Reviewed  URINALYSIS, ROUTINE W REFLEX MICROSCOPIC - Abnormal; Notable for the following components:      Result Value   Hgb urine dipstick SMALL (*)    Leukocytes,Ua SMALL (*)    Bacteria, UA RARE (*)    All other components within normal limits  CBC WITH DIFFERENTIAL/PLATELET - Abnormal; Notable for the following components:   Platelets 101 (*)    All other components within normal limits  COMPREHENSIVE METABOLIC PANEL - Abnormal; Notable for the following components:   Sodium 127 (*)    Chloride 92 (*)    Glucose, Bld 111 (*)    BUN 5 (*)    Alkaline Phosphatase 157 (*)    All other components within normal limits  URINE CULTURE  TSH    EKG None  Radiology No results found.  Procedures Procedures (including critical care  time)  Medications Ordered in ED Medications  sodium chloride 0.9 % bolus 500 mL (0 mLs Intravenous Stopped 12/29/19 1030)  sodium chloride 0.9 % bolus 500 mL (500 mLs Intravenous New Bag/Given 12/29/19 1116)    ED Course  I have reviewed the triage vital signs and the nursing notes.  Pertinent labs & imaging results that were available during my care of the patient were reviewed by me and considered in my medical decision making (see chart for details).    MDM Rules/Calculators/A&P  Patient presents with recurrent side effects from various antibiotics that she is taken for urine infection.  Patient is overall well-appearing on exam.  Mild dry mucous membranes.  Plan for repeat blood work to check kidney function, IV fluids and repeat urinalysis with urine culture.  Patient's blood work reviewed sodium is dropped to 127, chloride 92.  1 L normal saline given.  Patient's blood pressure normalized in the ER.  Urinalysis reviewed as 11-20 white blood cells, rare bacteria, culture sent.  Patient does have urinary symptoms.  Discussed results with patient and she is extremely uncomfortable going home as she has been through for antibiotics and significant symptoms and no resolution.  I discussed with her I would talk to the hospitalist for possible observation for a dose of Rocephin, fluids and recheck of blood work tomorrow.   Final Clinical Impression(s) / ED Diagnoses Final diagnoses:  Medication side effects  Thrombocytopenia ([place])  Hyponatremia  General weakness  Hypochloremia  Acute cystitis without hematuria    Rx / DC Orders ED Discharge Orders    None       Elnora Morrison, MD 12/31/19 1536

## 2019-12-29 NOTE — H&P (Signed)
History and Physical    Kelly Lewis [id number] DOB: [birth year] DOA: 12/29/2019  PCP: Chevis Pretty, FNP  Patient coming from: Home  I have personally briefly reviewed patient's old medical records in [place]  Chief Complaint: Dysuria, increased frequency, abdominal discomfort, nausea and general malaise.  HPI: Kelly Lewis is a 69 y.o. female with medical history significant of hypothyroidism, hypertension, gastroesophageal reflux disease, anxiety and class I obesity; who presented to the hospital secondary to worsening dysuria, increased frequency and concerns for adverse reaction to oral antibiotics.  Patient reports battling for almost a month ongoing urinary tract symptoms and unfortunately adverse reactions of GI symptoms were not placed on oral antibiotics.  She has noticed mild hematuria previously but that has resolved now.  Patient reports decreased oral intake and appetite.  Has been making an effort to maintain hydration.  Currently afebrile; no vomiting but expressed intermittent nausea.  Patient has use Augmentin, doxycycline, nitrofurantoin and Keflex as per empirical treatment by PCP and neurology service as an outpatient.  With more recent antibiotic patient experience joint pain, headache, midepigastric discomfort and ongoing nausea.  Patient denies chest pain, fever, chills, melena, hematochezia, focal weakness, poor vision, cough or shortness of breath.  No recent contact with anyone experiencing Covid symptoms.   ED Course: IV fluids provided; urine culture taken.  Patient started on Rocephin.  TRH contacted to place patient in the hospital for further evaluation and management of UTI that has failed outpatient oral antibiotics.  Review of Systems: As per HPI otherwise all other systems reviewed and are negative.   Past Medical History:  Diagnosis Date  . Chronic anxiety   . GERD (gastroesophageal reflux disease)   . H/O degenerative disc disease   .  Hypertension   . Plantar fasciitis   . Thyroid disease     Past Surgical History:  Procedure Laterality Date  . bladder tacked      Social History  reports that she has never smoked. She has never used smokeless tobacco. She reports that she does not drink alcohol or use drugs.  Allergies  Allergen Reactions  . Ciprofloxacin Hcl Nausea Only and Other (See Comments)    Abdominal pain, anxiety, facial rash  . Naprosyn [Naproxen]     gerd  . Shingrix [Zoster Vac Recomb Adjuvanted]     Family History of Reaction  . Sulfonamide Derivatives     REACTION: skin turns red  . Xylocaine [Lidocaine Hcl]     Real red, hot and flushed all over. Pt reports the redness was from being nervous and wasn't actually a reaction.     Family History  Problem Relation Age of Onset  . Diabetes Mother   . Pneumonia Father   . Cancer Father        skin  . Cancer Sister        skin    Prior to Admission medications   Medication Sig Start Date End Date Taking? Authorizing Provider  Cholecalciferol (VITAMIN D3) 2000 UNITS TABS Take 2,000 Units by mouth daily.    Yes [provider]  ibuprofen (ADVIL,MOTRIN) 200 MG tablet Take 200 mg by mouth every 6 (six) hours as needed for mild pain.    Yes [provider]  levothyroxine (SYNTHROID) 100 MCG tablet Take 1 tablet (100 mcg total) by mouth daily. 05/25/19  Yes Martin, Mary-Margaret, FNP  lisinopril (ZESTRIL) 10 MG tablet Take 1 tablet (10 mg total) by mouth daily. Patient taking differently: Take 5 mg  by mouth daily.  05/25/19  Yes Martin, Mary-Margaret, FNP  LORazepam (ATIVAN) 0.5 MG tablet Take 1 tablet (0.5 mg total) by mouth 2 (two) times daily as needed. Patient taking differently: Take 0.5 mg by mouth 2 (two) times daily as needed for anxiety.  11/30/19  Yes Martin, Mary-Margaret, FNP  omeprazole (PRILOSEC) 20 MG capsule Take 1 capsule (20 mg total) by mouth daily. 05/25/19  Yes Martin, Mary-Margaret, FNP  ondansetron (ZOFRAN) 4 MG  tablet Take 4 mg by mouth every 8 (eight) hours as needed. 12/27/19  Yes [provider]    Physical Exam: Vitals:   12/29/19 1500 12/29/19 1642 12/29/19 1928 12/29/19 2036  BP: (!) 145/77 (!) 145/73  (!) 126/59  Pulse: 74 85  83  Resp:  16  18  Temp:  98.1 F (36.7 C)  98.7 F (37.1 C)  TempSrc:  Oral  Oral  SpO2: 98% 100% 100% 98%  Weight:      Height:        Constitutional: NAD, calm, comfortable; reports on mild intermittent nausea and anorexia.  No chest pain or shortness of breath. Vitals:   12/29/19 1500 12/29/19 1642 12/29/19 1928 12/29/19 2036  BP: (!) 145/77 (!) 145/73  (!) 126/59  Pulse: 74 85  83  Resp:  16  18  Temp:  98.1 F (36.7 C)  98.7 F (37.1 C)  TempSrc:  Oral  Oral  SpO2: 98% 100% 100% 98%  Weight:      Height:       Eyes: PERRL, lids and conjunctivae normal ENMT: Mucous membranes are moist. Posterior pharynx clear of any exudate or lesions.Normal dentition.  Neck: normal, supple, no masses, no JVD. Respiratory: clear to auscultation bilaterally, no wheezing, no crackles. Normal respiratory effort. No accessory muscle use.  Cardiovascular: Regular rate and rhythm, no murmurs / rubs / gallops. No extremity edema. 2+ pedal pulses. No carotid bruits.  Abdomen: no tenderness, no masses palpated. No hepatosplenomegaly. Bowel sounds positive.  Musculoskeletal: no clubbing / cyanosis. No joint deformity upper and lower extremities. Good ROM, no contractures. Normal muscle tone.  Skin: no rashes, lesions, ulcers. No induration Neurologic: CN 2-12 grossly intact. Sensation intact, DTR normal. Strength 5/5 in all 4.  Psychiatric: Normal judgment and insight. Alert and oriented x 3. Normal mood.   Labs on Admission: I have personally reviewed following labs and imaging studies  CBC: Recent Labs  Lab 12/29/19 0916  WBC 5.0  NEUTROABS 3.7  HGB 12.6  HCT 38.1  MCV 89.4  PLT 101*    Basic Metabolic Panel: Recent Labs  Lab 12/29/19 0916  NA  127*  K 4.7  CL 92*  CO2 24  GLUCOSE 111*  BUN 5*  CREATININE 0.70  CALCIUM 9.5    GFR: Estimated Creatinine Clearance: 75.1 mL/min (by C-G formula based on SCr of 0.7 mg/dL).  Liver Function Tests: Recent Labs  Lab 12/29/19 0916  AST 24  ALT 19  ALKPHOS 157*  BILITOT 0.5  PROT 7.0  ALBUMIN 4.2    Urine analysis:    Component Value Date/Time   COLORURINE YELLOW 12/29/2019 0814   APPEARANCEUR CLEAR 12/29/2019 0814   APPEARANCEUR Cloudy (A) 11/23/2019 1158   LABSPEC 1.005 12/29/2019 0814   PHURINE 7.0 12/29/2019 0814   GLUCOSEU NEGATIVE 12/29/2019 0814   HGBUR SMALL (A) 12/29/2019 0814   BILIRUBINUR NEGATIVE 12/29/2019 0814   BILIRUBINUR Negative 11/23/2019 [place] 12/29/2019 0814   PROTEINUR NEGATIVE 12/29/2019 0814   UROBILINOGEN  negative 03/28/2015 0926   UROBILINOGEN 0.2 01/17/2010 0850   NITRITE NEGATIVE 12/29/2019 0814   LEUKOCYTESUR SMALL (A) 12/29/2019 0814    Radiological Exams on Admission: No results found.  EKG: None  Assessment/Plan 1-UTI (urinary tract infection): With recent microscopy demonstrating E. coli microorganism resistant to doxycycline, ampicillin, cefuroxime, fluoroquinolones, and Bactrim. -Patient reports ongoing dysuria, frequency and general malaise. -Has noticed intermittent episode of nausea and decreased in her oral intake. -4 different rounds of antibiotics prescribed in the outpatient setting between PCP and urology service; patient has failed oral management. -Dehydration and hyponatremia appreciated; patient admitted to [facility] bed for IV fluids, IV Rocephin; follow urine culture and use Pyridium for symptomatic management. -Follow clinical response.  If despite antibiotic symptoms continue will anticipate presence of fungal infection instead of bacterial component; especially after so many rounds of antibiotics.  2-GAD (generalized anxiety disorder) -Continue as needed anxiolytics.  3-Hypertension  -Overall stable and well-controlled. -Continue lisinopril.  4-hypothyroidism -Will check TSH -Continue Synthroid.  5-GERD (gastroesophageal reflux disease) -Continue PPI.  6-class I obesity -Body mass index is 31 kg/m. -Low calorie diet, portion control and increase physical activity discussed with patient.  7-dehydration/hyponatremia -In the setting of decreased oral intake and increase urinary losses with frequency/UTI presents. -Provide fluid resuscitation -Follow electrolytes trend -See problem #1 for further details about treatment of acute UTI.  DVT prophylaxis: SCDs Code Status:   Full code Family Communication:  Husband at bedside. Disposition Plan:   Patient is from:  Home  Anticipated DC to:  Home  Anticipated DC date:  12/30/19  Anticipated DC barriers: Barriers anticipated; patient to be discharged home once demonstrated ability to maintain adequate hydration/nutrition; improvement in her symptoms and a stabilization of abnormal electrolytes.  Consults called:  None Admission status:  Observation, MedSurg, length of stay less than 2 midnights.  Severity of Illness: Mild severity.  Patient complaining of intermittent nausea and has essentially failed outpatient antibiotic therapy.  Continued to be very symptomatic demonstrating electrolytes abnormalities.    Barton Dubois MD [facility]  How to contact the [facility] Attending or Consulting provider [place] or covering provider during after hours [place], for this patient?   1. Check the care team in [facility] and look for a) attending/consulting TR[facility] provider listed and b) the [facility] team listed 2. Log into [email or web address] and use [place]'s universal password to access. If you do not have the password, please contact the hospital operator. 3. Locate the [facility] provider you are looking for under [facility] and page to a number that you can be directly reached. 4. If you still have difficulty reaching the provider,  please page the [facility] (Director on Call) for the Hospitalists listed on amion for assistance.  12/29/2019, 10:27 PM

## 2019-12-29 NOTE — ED Notes (Signed)
Patient transported to MRI 

## 2019-12-30 DIAGNOSIS — K219 Gastro-esophageal reflux disease without esophagitis: Secondary | ICD-10-CM | POA: Diagnosis present

## 2019-12-30 DIAGNOSIS — E669 Obesity, unspecified: Secondary | ICD-10-CM | POA: Diagnosis present

## 2019-12-30 DIAGNOSIS — K5900 Constipation, unspecified: Secondary | ICD-10-CM | POA: Diagnosis present

## 2019-12-30 DIAGNOSIS — I10 Essential (primary) hypertension: Secondary | ICD-10-CM | POA: Diagnosis present

## 2019-12-30 DIAGNOSIS — N3000 Acute cystitis without hematuria: Secondary | ICD-10-CM | POA: Diagnosis not present

## 2019-12-30 DIAGNOSIS — D696 Thrombocytopenia, unspecified: Secondary | ICD-10-CM | POA: Diagnosis present

## 2019-12-30 DIAGNOSIS — E039 Hypothyroidism, unspecified: Secondary | ICD-10-CM | POA: Diagnosis present

## 2019-12-30 DIAGNOSIS — Z20822 Contact with and (suspected) exposure to covid-19: Secondary | ICD-10-CM | POA: Diagnosis present

## 2019-12-30 DIAGNOSIS — Z1623 Resistance to quinolones and fluoroquinolones: Secondary | ICD-10-CM | POA: Diagnosis present

## 2019-12-30 DIAGNOSIS — E860 Dehydration: Secondary | ICD-10-CM | POA: Diagnosis present

## 2019-12-30 DIAGNOSIS — Z6831 Body mass index (BMI) 31.0-31.9, adult: Secondary | ICD-10-CM | POA: Diagnosis not present

## 2019-12-30 DIAGNOSIS — E878 Other disorders of electrolyte and fluid balance, not elsewhere classified: Secondary | ICD-10-CM | POA: Diagnosis present

## 2019-12-30 DIAGNOSIS — N390 Urinary tract infection, site not specified: Secondary | ICD-10-CM | POA: Diagnosis present

## 2019-12-30 DIAGNOSIS — E871 Hypo-osmolality and hyponatremia: Secondary | ICD-10-CM | POA: Diagnosis present

## 2019-12-30 DIAGNOSIS — E6609 Other obesity due to excess calories: Secondary | ICD-10-CM

## 2019-12-30 DIAGNOSIS — Z79899 Other long term (current) drug therapy: Secondary | ICD-10-CM | POA: Diagnosis not present

## 2019-12-30 DIAGNOSIS — B9620 Unspecified Escherichia coli [E. coli] as the cause of diseases classified elsewhere: Secondary | ICD-10-CM | POA: Diagnosis present

## 2019-12-30 DIAGNOSIS — F411 Generalized anxiety disorder: Secondary | ICD-10-CM | POA: Diagnosis present

## 2019-12-30 DIAGNOSIS — Z79890 Hormone replacement therapy (postmenopausal): Secondary | ICD-10-CM | POA: Diagnosis not present

## 2019-12-30 LAB — BASIC METABOLIC PANEL
Anion gap: 7 (ref 5–15)
BUN: 6 mg/dL — ABNORMAL LOW (ref 8–23)
CO2: 24 mmol/L (ref 22–32)
Calcium: 9.2 mg/dL (ref 8.9–10.3)
Chloride: 101 mmol/L (ref 98–111)
Creatinine, Ser: 0.69 mg/dL (ref 0.44–1.00)
GFR calc Af Amer: >60 mL/min (ref >60)
GFR calc non Af Amer: >60 mL/min (ref >60)
Glucose, Bld: 96 mg/dL (ref 70–99)
Potassium: 4 mmol/L (ref 3.5–5.1)
Sodium: 132 mmol/L — ABNORMAL LOW (ref 135–145)

## 2019-12-30 MED ORDER — POLYETHYLENE GLYCOL 3350 17 G PO PACK: 17.0000 g | PACK | Freq: Every day | ORAL | Status: DC | PRN

## 2019-12-30 NOTE — Progress Notes (Signed)
Pt stated she woke up in a sweat and was unsure if she had a fever. Temp 98.1, pt stated her stomach was uneasy but she declined PRN nausea medication. Pt stated she will call if additional interventions are needed. Bed in low position, call bell in reach

## 2019-12-30 NOTE — Progress Notes (Signed)
PROGRESS NOTE    Kelly Lewis  [id number] DOB: 1951-04-04 DOA: 12/29/2019 PCP: Chevis Pretty, FNP    Chief Complaint  Patient presents with  . Allergic Reaction    Brief Narrative:  69 y.o. female with medical history significant of hypothyroidism, hypertension, gastroesophageal reflux disease, anxiety and class I obesity; who presented to the hospital secondary to worsening dysuria, increased frequency and concerns for adverse reaction to oral antibiotics.  Patient reports battling for almost a month ongoing urinary tract symptoms and unfortunately adverse reactions of GI symptoms were not placed on oral antibiotics.  She has noticed mild hematuria previously but that has resolved now.  Patient reports decreased oral intake and appetite.  Has been making an effort to maintain hydration.  Currently afebrile; no vomiting but expressed intermittent nausea.  Patient has use Augmentin, doxycycline, nitrofurantoin and Keflex as per empirical treatment by PCP and neurology service as an outpatient.  With more recent antibiotic patient experience joint pain, headache, midepigastric discomfort and ongoing nausea.  Patient denies chest pain, fever, chills, melena, hematochezia, focal weakness, poor vision, cough or shortness of breath.  No recent contact with anyone experiencing Covid symptoms.   ED Course: IV fluids provided; urine culture taken.  Patient started on Rocephin.  TRH contacted to place patient in the hospital for further evaluation and management of UTI that has failed outpatient oral antibiotics.  Assessment & Plan: 1-E. Coli UTI -Patient has failed outpatient oral therapy -Continue to have some nausea and low-grade temperature overnight. -Continue current IV antibiotics -Continue IV fluids and supportive care. -We will also continue the use of Pyridium -At discharge, once antibiotics completed will provide 2 days of fluconazole.  2-generalized anxiety  disorder -Continue as needed anxiolytics.  3-hypertension -Overall well controlled -Continue lisinopril.  4-hypothyroidism -A stable TSH -Continue Synthroid at current dose.  5-gastroesophageal disease -Continue PPI. -As needed antiemetics will be also provided.  6-constipation -In the setting of decreased oral intake prior to admission and chronic hypothyroidism -As needed MiraLAX has been ordered.  7-class I obesity -Body mass index is 31 kg/m. -Low calorie diet, portion control and increase physical activity discussed with patient.  8-dehydration/hyponatremia -In the setting of decreased oral intake and increased urinary losses with frequency/UTI. -Patient reports improvement in oral intake; even is still feeling uneasy in her abdomen and having nausea. -After fluid resuscitation sodium up to 132; will continue to follow trend -Continue further diet advancement and IV fluids.   DVT prophylaxis: SCDs Code Status: Full code Family Communication: Husband at bedside. Disposition:   Status is: Inpatient  Dispo: The patient is from: Home              Anticipated d/c is to: Home              Anticipated d/c date is: 12/31/2019              Patient currently no medically to be discharged in the setting of ongoing nausea, low-grade temperature overnight, diaphoresis, still low sodium level and hesitation to oral antibiotics with concerns of inability to tolerate.  Continue IV antibiotics, IV fluids, and Pyridium.  Hoping for discharge on 12/31/2019.        Consultants:   None   Procedures:   See below for x-ray reports.   Antimicrobials:  Rocephin (started on 12/29/2019; looking to treat for a total of 3 days).   Subjective: Patient reports improvement in her dysuria; some low-grade temperature and diaphoresis overnight and is still complaining of  nausea.  Sodium level still low even better in comparison to admission.  Reports  constipation.  Objective: Vitals:   12/30/19 0430 12/30/19 1035 12/30/19 1039 12/30/19 1358  BP: (!) 105/54 (!) 132/55 (!) 132/55 127/63  Pulse: 65 68  67  Resp: 16 16  18   Temp: 98.6 F (37 C)   98.4 F (36.9 C)  TempSrc:      SpO2: 98% 100%  100%  Weight:      Height:        Intake/Output Summary (Last 24 hours) at 12/30/2019 1636 Last data filed at 12/30/2019 0900 Gross per 24 hour  Intake 1831.82 ml  Output --  Net 1831.82 ml   Filed Weights   12/29/19 0743  Weight: 88.5 kg    Examination:  General exam: Appears calm and comfortable; reports still feeling uneasy in her stomach, low-grade temperature and diaphoresis overnight; feeling nauseated.  Reports some constipation.  No vomiting.  Overall expressed improvement in her dysuria.  Frequency is still present. Respiratory system: Clear to auscultation. Respiratory effort normal. Cardiovascular system: S1 & S2 heard, RRR. No JVD, murmurs, rubs, gallops or clicks. No pedal edema. Gastrointestinal system: Abdomen is nondistended, soft and nontender. No organomegaly or masses felt. Normal bowel sounds heard. Central nervous system: Alert and oriented. No focal neurological deficits. Extremities: Symmetric 5 x 5 power. Skin: No rashes, no petechiae. Psychiatry: Judgement and insight appear normal. Mood & affect appropriate.     Data Reviewed: I have personally reviewed following labs and imaging studies  CBC: Recent Labs  Lab 12/29/19 0916  WBC 5.0  NEUTROABS 3.7  HGB 12.6  HCT 38.1  MCV 89.4  PLT 101*    Basic Metabolic Panel: Recent Labs  Lab 12/29/19 0916 12/30/19 0503  NA 127* 132*  K 4.7 4.0  CL 92* 101  CO2 24 24  GLUCOSE 111* 96  BUN 5* 6*  CREATININE 0.70 0.69  CALCIUM 9.5 9.2    GFR: Estimated Creatinine Clearance: 75.1 mL/min (by C-G formula based on SCr of 0.69 mg/dL).  Liver Function Tests: Recent Labs  Lab 12/29/19 0916  AST 24  ALT 19  ALKPHOS 157*  BILITOT 0.5  PROT 7.0   ALBUMIN 4.2    CBG: No results for input(s): GLUCAP in the last 168 hours.   Recent Results (from the past 240 hour(s))  Urine culture     Status: Abnormal (Preliminary result)   Collection Time: 12/29/19  8:25 AM   Specimen: Urine, Clean Catch  Result Value Ref Range Status   Specimen Description   Final    URINE, CLEAN CATCH Performed at [facility], [address] [place], [place] [address]    Special Requests   Final    NONE Performed at [facility], [address] [place] [address], [place], [place] [address]    Culture >=100,000 COLONIES/mL GRAM NEGATIVE RODS (A)  Final   Report Status PENDING  Incomplete  SARS CORONAVIRUS 2 (TAT 6-24 HRS) Nasopharyngeal Nasopharyngeal Swab     Status: None   Collection Time: 12/29/19  1:19 PM   Specimen: Nasopharyngeal Swab  Result Value Ref Range Status   SARS Coronavirus 2 NEGATIVE NEGATIVE Final    Comment: (NOTE) SARS-CoV-2 target nucleic acids are NOT DETECTED. The SARS-CoV-2 RNA is generally detectable in upper and lower respiratory specimens during the acute phase of infection. Negative results do not preclude SARS-CoV-2 infection, do not rule out co-infections with other pathogens, and should not be used as the sole basis for treatment  or other patient management decisions. Negative results must be combined with clinical observations, patient history, and epidemiological information. The expected result is Negative. Fact Sheet for Patients: SugarRoll.be Fact Sheet for Healthcare Providers: https://www.woods-mathews.com/ This test is not yet approved or cleared by the [place] FDA and  has been authorized for detection and/or diagnosis of SARS-CoV-2 by FDA under an Emergency Use Authorization (EUA). This EUA will remain  in effect (meaning this test can be used) for the duration of the COVID-19 declaration under Section 56 4(b)(1) of the Act, 21 U.S.C. section 360bbb-3(b)(1), unless the  authorization is terminated or revoked sooner. Performed at [facility], [place] [address], [place],  [address]      Radiology Studies: No results found.   Scheduled Meds: . cholecalciferol  2,000 Units Oral Daily  . levothyroxine  100 mcg Oral Daily  . lisinopril  5 mg Oral Daily  . pantoprazole  40 mg Oral BID  . phenazopyridine  100 mg Oral TID WC   Continuous Infusions: . sodium chloride 75 mL/hr at 12/29/19 1641  . cefTRIAXone (ROCEPHIN)  IV 1 g (12/30/19 1326)     LOS: 0 days    Time spent: 30 minutes    Barton Dubois, MD [facility]   To contact the attending provider between 7A-7P or the covering provider during after hours 7P-7A, please log into the web site [email or web address] and access using universal [place] password for that web site. If you do not have the password, please call the hospital operator.  12/30/2019, 4:36 PM

## 2019-12-31 DIAGNOSIS — E878 Other disorders of electrolyte and fluid balance, not elsewhere classified: Secondary | ICD-10-CM

## 2019-12-31 DIAGNOSIS — E871 Hypo-osmolality and hyponatremia: Secondary | ICD-10-CM

## 2019-12-31 LAB — BASIC METABOLIC PANEL
Anion gap: 8 (ref 5–15)
BUN: 5 mg/dL — ABNORMAL LOW (ref 8–23)
CO2: 26 mmol/L (ref 22–32)
Calcium: 9.4 mg/dL (ref 8.9–10.3)
Chloride: 99 mmol/L (ref 98–111)
Creatinine, Ser: 0.75 mg/dL (ref 0.44–1.00)
GFR calc Af Amer: >60 mL/min (ref >60)
GFR calc non Af Amer: >60 mL/min (ref >60)
Glucose, Bld: 129 mg/dL — ABNORMAL HIGH (ref 70–99)
Potassium: 4.3 mmol/L (ref 3.5–5.1)
Sodium: 133 mmol/L — ABNORMAL LOW (ref 135–145)

## 2019-12-31 LAB — URINE CULTURE: Culture: >=100000 — AB

## 2019-12-31 MED ORDER — FLUCONAZOLE 100 MG PO TABS: 100.0000 mg | ORAL_TABLET | Freq: Every day | ORAL | 0 refills | Status: AC

## 2019-12-31 MED ORDER — PHENAZOPYRIDINE HCL 100 MG PO TABS
100.0000 mg | ORAL_TABLET | Freq: Three times a day (TID) | ORAL | 0 refills | Status: AC
Start: 2019-12-31 — End: 2020-01-01

## 2019-12-31 MED ORDER — ONDANSETRON 8 MG PO TBDP
8.0000 mg | ORAL_TABLET | Freq: Three times a day (TID) | ORAL | 0 refills | Status: DC | PRN
Start: 2019-12-31 — End: 2020-06-05

## 2019-12-31 NOTE — Discharge Summary (Signed)
Physician Discharge Summary  Kelly Lewis [id number] DOB: 06/13/1951 DOA: 12/29/2019  PCP: Chevis Pretty, FNP  Admit date: 12/29/2019 Discharge date: 12/31/2019  Time spent: 35 minutes  Recommendations for Outpatient Follow-up:  1. Repeat basic metabolic panel to evaluate electrolytes and renal function. 2. -Patient will benefit of referral to see urology/gynecology service for further evaluation and management of descended bladder.   Discharge Diagnoses:  Principal Problem:   UTI (urinary tract infection) Active Problems:   GAD (generalized anxiety disorder)   Hypertension   GERD (gastroesophageal reflux disease)   Hypochloremia   Hyponatremia Dehydration Class I obesity  Discharge Condition: Stable and improved.  Patient discharged home with instruction to follow-up with PCP in 10 days.  CODE STATUS: Full code.  Diet recommendation: Heart healthy and low calorie diet.  Filed Weights   12/29/19 0743  Weight: 88.5 kg    History of present illness:  69 y.o.femalewith medical history significant ofhypothyroidism, hypertension, gastroesophageal reflux disease, anxiety and class I obesity; who presented to the hospital secondary to worsening dysuria, increased frequency and concerns for adverse reaction to oral antibiotics. Patient reports battling for almost a month ongoing urinary tract symptoms and unfortunately adverse reactions of GI symptoms were not placed on oral antibiotics. She has noticed mild hematuria previously but that has resolved now. Patient reports decreased oral intake and appetite. Has been making an effort to maintain hydration. Currently afebrile; no vomiting but expressed intermittent nausea. Patient has use Augmentin, doxycycline, nitrofurantoin and Keflex as per empirical treatment by PCP and neurology service as an outpatient. With more recent antibiotic patient experience joint pain, headache, midepigastric discomfort and ongoing  nausea. Patient denies chest pain, fever, chills, melena, hematochezia, focal weakness, poor vision, cough or shortness of breath. No recent contact with anyone experiencing Covid symptoms.   ED Course:IV fluids provided; urine culture taken. Patient started on Rocephin. TRH contacted to place patient in the hospital for further evaluation and management of UTI that has failed outpatient oral antibiotics.  Hospital Course:  1-E. Coli UTI -Patient has failed outpatient oral therapy. -Following culture sensitivity microorganism demonstrated to be resistant to fluoroquinolones, ampicillin and Bactrim. -No fever, no vomiting, improved dysuria and frequency at time of discharge. -Treated with ceftriaxone intravenously for 3 days. -Advised to maintain adequate hydration. -Will also continue the use of Pyridium for another 24 hours. -At discharge, once antibiotics completed will provide 2 days of fluconazole.  2-generalized anxiety disorder -Continue as needed anxiolytics. -Stable mood at discharge.  3-hypertension -Overall well controlled -Continue lisinopril. -Heart healthy diet discussed with patient.  4-hypothyroidism -stable TSH -Continue Synthroid at current dose.  5-gastroesophageal disease -Continue PPI. -As needed antiemetics will be also prescribed at discharge.  6-constipation -In the setting of decreased oral intake prior to admission and chronic hypothyroidism -As needed MiraLAX has been recommended. -Increasing fiber ingestion and activity also discussed.  7-class I obesity -Body mass index is 31 kg/m. -Low calorie diet, portion control and increase physical activity discussed with patient.  8-dehydration/hyponatremia -In the setting of decreased oral intake and increased urinary losses with frequency/UTI. -After fluid resuscitation sodium up to 133; and tolerating better PO's -will discharge home with instruction to keep yourself well-hydrated and  continue increasing oral nutrition. -Repeat basic metabolic panel to follow lecture lites at follow-up visit. -As needed antiemetics has been prescribed.  Procedures:  See below for x-ray reports  Consultations:  None   Discharge Exam: Vitals:   12/30/19 2016 12/31/19 0500  BP: (!) 125/57 136/74  Pulse:  72 80  Resp: 16 18  Temp: 98.6 F (37 C) 98.1 F (36.7 C)  SpO2: 98% 99%    General: Feeling better, no chest pain, no vomiting, tolerating diet and expressing significant improvement in her dysuria.  Still mild intermittent nausea but able to tolerate orals without difficulties. Cardiovascular: S1 and S2, no rubs, no gallops, no murmurs; no JVD. Respiratory: Clear to auscultation bilaterally, no wheezing, no crackles, no using accessory muscle. Abdomen: Soft, nontender, nondistended, positive bowel sounds Extremities: No cyanosis or clubbing.  Discharge Instructions   Discharge Instructions    Diet - low sodium heart healthy   Complete by: As directed    Discharge instructions   Complete by: As directed    Monitor medical hydration To medication as prescribed Follow-up with PCP in 10 days Continue to pursue outpatient follow-up with urology/gynecology for further evaluation and management of the descended  bladder.     Allergies as of 12/31/2019      Reactions   Ciprofloxacin Hcl Nausea Only, Other (See Comments)   Abdominal pain, anxiety, facial rash   Naprosyn [naproxen]    gerd   Shingrix [zoster Vac Recomb Adjuvanted]    Family History of Reaction   Sulfonamide Derivatives    REACTION: skin turns red   Xylocaine [lidocaine Hcl]    Real red, hot and flushed all over. Pt reports the redness was from being nervous and wasn't actually a reaction.       Medication List    STOP taking these medications   ondansetron 4 MG tablet Commonly known as: ZOFRAN     TAKE these medications   fluconazole 100 MG tablet Commonly known as: Diflucan Take 1 tablet  (100 mg total) by mouth daily for 2 doses.   ibuprofen 200 MG tablet Commonly known as: ADVIL Take 200 mg by mouth every 6 (six) hours as needed for mild pain.   levothyroxine 100 MCG tablet Commonly known as: SYNTHROID Take 1 tablet (100 mcg total) by mouth daily.   lisinopril 10 MG tablet Commonly known as: ZESTRIL Take 1 tablet (10 mg total) by mouth daily. What changed: how much to take   LORazepam 0.5 MG tablet Commonly known as: ATIVAN Take 1 tablet (0.5 mg total) by mouth 2 (two) times daily as needed. What changed: reasons to take this   omeprazole 20 MG capsule Commonly known as: PRILOSEC Take 1 capsule (20 mg total) by mouth daily.   ondansetron 8 MG disintegrating tablet Commonly known as: Zofran ODT Take 1 tablet (8 mg total) by mouth every 8 (eight) hours as needed for nausea or vomiting.   phenazopyridine 100 MG tablet Commonly known as: PYRIDIUM Take 1 tablet (100 mg total) by mouth 3 (three) times daily with meals for 3 doses.   Vitamin D3 50 MCG (2000 UT) Tabs Take 2,000 Units by mouth daily.      Allergies  Allergen Reactions  . Ciprofloxacin Hcl Nausea Only and Other (See Comments)    Abdominal pain, anxiety, facial rash  . Naprosyn [Naproxen]     gerd  . Shingrix [Zoster Vac Recomb Adjuvanted]     Family History of Reaction  . Sulfonamide Derivatives     REACTION: skin turns red  . Xylocaine [Lidocaine Hcl]     Real red, hot and flushed all over. Pt reports the redness was from being nervous and wasn't actually a reaction.    Follow-up Information    Chevis Pretty, FNP. Schedule an appointment as soon as possible  for a visit in 10 day(s).   Specialty: Family Medicine Contact information: [place] [address] [phone]            The results of significant diagnostics from this hospitalization (including imaging, microbiology, ancillary and laboratory) are listed below for reference.    Significant  Diagnostic Studies: No results found.  Microbiology: Recent Results (from the past 240 hour(s))  Urine culture     Status: Abnormal   Collection Time: 12/29/19  8:25 AM   Specimen: Urine, Clean Catch  Result Value Ref Range Status   Specimen Description   Final    URINE, CLEAN CATCH Performed at [facility], [address] [place], [place] [address]    Special Requests   Final    NONE Performed at [facility], [address], [place], [place] [address]    Culture >=100,000 COLONIES/mL ESCHERICHIA COLI (A)  Final   Report Status 12/31/2019 FINAL  Final   Organism ID, Bacteria ESCHERICHIA COLI (A)  Final      Susceptibility   Escherichia coli - MIC*    AMPICILLIN >=32 RESISTANT Resistant     CEFAZOLIN <=4 SENSITIVE Sensitive     CEFTRIAXONE <=1 SENSITIVE Sensitive     CIPROFLOXACIN >=4 RESISTANT Resistant     GENTAMICIN <=1 SENSITIVE Sensitive     IMIPENEM <=0.25 SENSITIVE Sensitive     NITROFURANTOIN <=16 SENSITIVE Sensitive     TRIMETH/SULFA >=320 RESISTANT Resistant     AMPICILLIN/SULBACTAM 16 INTERMEDIATE Intermediate     PIP/TAZO <=4 SENSITIVE Sensitive     * >=100,000 COLONIES/mL ESCHERICHIA COLI  SARS CORONAVIRUS 2 (TAT 6-24 HRS) Nasopharyngeal Nasopharyngeal Swab     Status: None   Collection Time: 12/29/19  1:19 PM   Specimen: Nasopharyngeal Swab  Result Value Ref Range Status   SARS Coronavirus 2 NEGATIVE NEGATIVE Final    Comment: (NOTE) SARS-CoV-2 target nucleic acids are NOT DETECTED. The SARS-CoV-2 RNA is generally detectable in upper and lower respiratory specimens during the acute phase of infection. Negative results do not preclude SARS-CoV-2 infection, do not rule out co-infections with other pathogens, and should not be used as the sole basis for treatment or other patient management decisions. Negative results must be combined with clinical observations, patient history, and epidemiological information. The expected result is Negative. Fact Sheet for  Patients: SugarRoll.be Fact Sheet for Healthcare Providers: https://www.woods-mathews.com/ This test is not yet approved or cleared by the [place] FDA and  has been authorized for detection and/or diagnosis of SARS-CoV-2 by FDA under an Emergency Use Authorization (EUA). This EUA will remain  in effect (meaning this test can be used) for the duration of the COVID-19 declaration under Section 56 4(b)(1) of the Act, 21 U.S.C. section 360bbb-3(b)(1), unless the authorization is terminated or revoked sooner. Performed at [facility], [place] [address] [place], [place] [address]      Labs: Basic Metabolic Panel: Recent Labs  Lab 12/29/19 0916 12/30/19 0503 12/31/19 0833  NA 127* 132* 133*  K 4.7 4.0 4.3  CL 92* 101 99  CO2 24 24 26   GLUCOSE 111* 96 129*  BUN 5* 6* 5*  CREATININE 0.70 0.69 0.75  CALCIUM 9.5 9.2 9.4   Liver Function Tests: Recent Labs  Lab 12/29/19 0916  AST 24  ALT 19  ALKPHOS 157*  BILITOT 0.5  PROT 7.0  ALBUMIN 4.2   CBC: Recent Labs  Lab 12/29/19 0916  WBC 5.0  NEUTROABS 3.7  HGB 12.6  HCT 38.1  MCV 89.4  PLT 101*   Signed:  Barton Dubois MD.  [facility] 12/31/2019, 3:35 PM

## 2019-12-31 NOTE — Progress Notes (Signed)
Discharge instructions reviewed with patient and husband. Both verbalized understanding of instructions.  Patient discharged home with husband in stable condition.   

## 2019-12-31 NOTE — Care Management Important Message (Signed)
Important Message  Patient Details  Name: Kelly Lewis MRN: [id number] Date of Birth: 1950-12-22   Medicare Important Message Given:  Yes     Tommy Medal 12/31/2019, 3:20 PM

## 2020-01-06 ENCOUNTER — Telehealth: Payer: Self-pay | Admitting: *Deleted

## 2020-01-06 ENCOUNTER — Ambulatory Visit: Payer: Medicare PPO | Admitting: *Deleted

## 2020-01-06 NOTE — Telephone Encounter (Signed)
01/06/2020  Please reach out to patient to schedule a hospital follow-up. Discharged on 12/31/2019.   Recommendations for Outpatient Follow-up:   1. Repeat basic metabolic panel to evaluate electrolytes and renal function. 2. -Patient will benefit of referral to see urology/gynecology service for further evaluation and management of descended bladder.  Discharge Diagnoses:  Principal Problem:   UTI (urinary tract infection) Active Problems:   GAD (generalized anxiety disorder)   Hypertension   GERD (gastroesophageal reflux disease)   Hypochloremia   Hyponatremia Dehydration Class I obesity  Discharge Condition: Stable and improved.  Patient discharged home with instruction to follow-up with PCP in 10 days.   Chong Sicilian, BSN, RN-BC Embedded Chronic Care Manager [facility] Management Direct Dial: [phone]

## 2020-01-06 NOTE — Telephone Encounter (Signed)
Lmtcb.

## 2020-01-06 NOTE — Telephone Encounter (Signed)
Appt made

## 2020-01-06 NOTE — Chronic Care Management (AMB) (Signed)
  Care Management   Care Coordination Note  01/06/2020 Name: Kelly Lewis MRN: [id number] DOB: [birth year]  Discharged from [facility] on 12/31/19. Hospital follow-up has not been scheduled. Forwarded discharge information to clinical staff at [facility] so that they can contact [name] to schedule a hospital follow-up appointment.     Chong Sicilian, BSN, RN-BC Embedded Chronic Care Manager [facility] [place] Management Direct Dial: [phone]

## 2020-01-13 ENCOUNTER — Telehealth: Payer: Medicare PPO

## 2020-01-14 ENCOUNTER — Encounter: Payer: Self-pay | Admitting: Family

## 2020-01-14 ENCOUNTER — Other Ambulatory Visit: Payer: Self-pay

## 2020-01-14 ENCOUNTER — Ambulatory Visit: Payer: Medicare PPO | Admitting: Family

## 2020-01-14 VITALS — BP 133/71 | HR 95 | Temp 97.7°F | Ht 66.5 in | Wt 196.4 lb

## 2020-01-14 DIAGNOSIS — Z09 Encounter for follow-up examination after completed treatment for conditions other than malignant neoplasm: Secondary | ICD-10-CM

## 2020-01-14 DIAGNOSIS — R399 Unspecified symptoms and signs involving the genitourinary system: Secondary | ICD-10-CM | POA: Diagnosis not present

## 2020-01-14 NOTE — Progress Notes (Signed)
   Subjective:    Patient ID: Kelly Lewis, female    DOB: [birth year], 69 y.o.   MRN: [id number]  Chief Complaint  Patient presents with  . Hospitalization Follow-up    HPI PT presents to the office today for hospital follow up. She went to the ED on 12/29/19 with worsening UTI. She had tried multiple antibiotics, "Patient has use Augmentin, doxycycline, nitrofurantoin and Keflex as per empirical treatment by PCP and urologists service as an outpatient. With more recent antibiotic patient experience joint pain, headache, midepigastric discomfort and ongoing nausea.".   She was admitted and given IV hydration and rocephin. She denies any UTI symptoms at this time. Continues to have intermittent nausea and constipation. This has improved since stopping antibiotics.    Review of Systems  All other systems reviewed and are negative.      Objective:   Physical Exam Vitals reviewed.  Constitutional:      General: She is not in acute distress.    Appearance: She is well-developed.  HENT:     Head: Normocephalic and atraumatic.     Right Ear: Tympanic membrane normal.     Left Ear: Tympanic membrane normal.  Eyes:     Pupils: Pupils are equal, round, and reactive to light.  Neck:     Thyroid: No thyromegaly.  Cardiovascular:     Rate and Rhythm: Normal rate and regular rhythm.     Heart sounds: Normal heart sounds. No murmur.  Pulmonary:     Effort: Pulmonary effort is normal. No respiratory distress.     Breath sounds: Normal breath sounds. No wheezing.  Abdominal:     General: Bowel sounds are normal. There is no distension.     Palpations: Abdomen is soft.     Tenderness: There is no abdominal tenderness. There is no right CVA tenderness, left CVA tenderness, guarding or rebound.  Musculoskeletal:        General: No tenderness. Normal range of motion.     Cervical back: Normal range of motion and neck supple.  Skin:    General: Skin is warm and dry.  Neurological:   Mental Status: She is alert and oriented to person, place, and time.     Cranial Nerves: No cranial nerve deficit.     Deep Tendon Reflexes: Reflexes are normal and symmetric.  Psychiatric:        Behavior: Behavior normal.        Thought Content: Thought content normal.        Judgment: Judgment normal.     BP 133/71   Pulse 95   Temp 97.7 F (36.5 C) (Temporal)   Ht 5' 6.5" (1.689 m)   Wt 196 lb 6.4 oz (89.1 kg)   SpO2 96%   BMI 31.22 kg/m        Assessment & Plan:  Kelly Lewis comes in today with chief complaint of Hospitalization Follow-up   Diagnosis and orders addressed:  1. Hospital discharge follow-up Hospital notes reviewed  - BMP8+EGFR - CBC with Differential/Platelet  2. UTI symptoms Resolved Force fluids Keep Urologists appt  Labs pending  - BMP8+EGFR - CBC with Differential/Platelet      Evelina Dun, FNP

## 2020-01-14 NOTE — Patient Instructions (Signed)

## 2020-01-15 LAB — CBC WITH DIFFERENTIAL/PLATELET
Basophils Absolute: 0 10*3/uL (ref 0.0–0.2)
Basos: 1 %
EOS (ABSOLUTE): 0 10*3/uL (ref 0.0–0.4)
Eos: 1 %
Hematocrit: 38.6 % (ref 34.0–46.6)
Hemoglobin: 13 g/dL (ref 11.1–15.9)
Immature Grans (Abs): 0 10*3/uL (ref 0.0–0.1)
Immature Granulocytes: 0 %
Lymphocytes Absolute: 1.3 10*3/uL (ref 0.7–3.1)
Lymphs: 21 %
MCH: 29.9 pg (ref 26.6–33.0)
MCHC: 33.7 g/dL (ref 31.5–35.7)
MCV: 89 fL (ref 79–97)
Monocytes Absolute: 0.4 10*3/uL (ref 0.1–0.9)
Monocytes: 7 %
Neutrophils Absolute: 4.4 10*3/uL (ref 1.4–7.0)
Neutrophils: 70 %
Platelets: 117 10*3/uL — ABNORMAL LOW (ref 150–450)
RBC: 4.35 x10E6/uL (ref 3.77–5.28)
RDW: 12.9 % (ref 11.7–15.4)
WBC: 6.2 10*3/uL (ref 3.4–10.8)

## 2020-01-15 LAB — BMP8+EGFR
BUN/Creatinine Ratio: 7 — ABNORMAL LOW (ref 12–28)
BUN: 6 mg/dL — ABNORMAL LOW (ref 8–27)
CO2: 23 mmol/L (ref 20–29)
Calcium: 9.7 mg/dL (ref 8.7–10.3)
Chloride: 94 mmol/L — ABNORMAL LOW (ref 96–106)
Creatinine, Ser: 0.82 mg/dL (ref 0.57–1.00)
GFR calc Af Amer: 84 mL/min/{1.73_m2} (ref >59)
GFR calc non Af Amer: 73 mL/min/{1.73_m2} (ref >59)
Glucose: 91 mg/dL (ref 65–99)
Potassium: 4.6 mmol/L (ref 3.5–5.2)
Sodium: 131 mmol/L — ABNORMAL LOW (ref 134–144)

## 2020-03-07 DIAGNOSIS — R339 Retention of urine, unspecified: Secondary | ICD-10-CM | POA: Diagnosis not present

## 2020-03-07 DIAGNOSIS — R351 Nocturia: Secondary | ICD-10-CM | POA: Insufficient documentation

## 2020-03-07 DIAGNOSIS — N3000 Acute cystitis without hematuria: Secondary | ICD-10-CM | POA: Diagnosis not present

## 2020-03-07 DIAGNOSIS — N390 Urinary tract infection, site not specified: Secondary | ICD-10-CM | POA: Diagnosis not present

## 2020-03-07 DIAGNOSIS — N958 Other specified menopausal and perimenopausal disorders: Secondary | ICD-10-CM | POA: Diagnosis not present

## 2020-03-07 DIAGNOSIS — N8110 Cystocele, unspecified: Secondary | ICD-10-CM | POA: Diagnosis not present

## 2020-03-07 DIAGNOSIS — R82998 Other abnormal findings in urine: Secondary | ICD-10-CM | POA: Diagnosis not present

## 2020-03-29 ENCOUNTER — Other Ambulatory Visit: Payer: Self-pay | Admitting: Nurse Practitioner

## 2020-03-29 DIAGNOSIS — E039 Hypothyroidism, unspecified: Secondary | ICD-10-CM

## 2020-05-16 ENCOUNTER — Other Ambulatory Visit: Payer: Self-pay | Admitting: Nurse Practitioner

## 2020-05-16 DIAGNOSIS — S39012A Strain of muscle, fascia and tendon of lower back, initial encounter: Secondary | ICD-10-CM

## 2020-05-25 ENCOUNTER — Ambulatory Visit: Payer: Self-pay | Admitting: Nurse Practitioner

## 2020-05-26 DIAGNOSIS — M5412 Radiculopathy, cervical region: Secondary | ICD-10-CM | POA: Diagnosis not present

## 2020-05-26 DIAGNOSIS — M542 Cervicalgia: Secondary | ICD-10-CM | POA: Diagnosis not present

## 2020-05-31 ENCOUNTER — Other Ambulatory Visit: Payer: Self-pay

## 2020-05-31 ENCOUNTER — Ambulatory Visit: Payer: Medicare PPO | Attending: Chiropractic Medicine | Admitting: Physical Therapy

## 2020-05-31 ENCOUNTER — Encounter: Payer: Self-pay | Admitting: Physical Therapy

## 2020-05-31 DIAGNOSIS — M5412 Radiculopathy, cervical region: Secondary | ICD-10-CM | POA: Diagnosis not present

## 2020-05-31 DIAGNOSIS — M6281 Muscle weakness (generalized): Secondary | ICD-10-CM | POA: Diagnosis not present

## 2020-05-31 DIAGNOSIS — R293 Abnormal posture: Secondary | ICD-10-CM | POA: Diagnosis not present

## 2020-05-31 DIAGNOSIS — M542 Cervicalgia: Secondary | ICD-10-CM | POA: Diagnosis not present

## 2020-05-31 NOTE — Therapy (Signed)
[facility] [place], [place], [address] Phone: [phone]   Fax:  [phone]  Physical Therapy Evaluation  Patient Details  Name: Kelly Lewis MRN: [id number] Date of Birth: [birth year] Referring Provider (PT): Levy Pupa, PA-C   Encounter Date: 05/31/2020   PT End of Session - 05/31/20 1730    Visit Number 1    Number of Visits 12    Date for PT Re-Evaluation 07/19/20    Authorization Type [employer]    PT Start Time 0816    PT Stop Time 0858    PT Time Calculation (min) 42 min    Activity Tolerance Patient tolerated treatment well    Behavior During Therapy [facility] for tasks assessed/performed           Past Medical History:  Diagnosis Date  . Chronic anxiety   . GERD (gastroesophageal reflux disease)   . H/O degenerative disc disease   . Hypertension   . Plantar fasciitis   . Thyroid disease     Past Surgical History:  Procedure Laterality Date  . bladder tacked      There were no vitals filed for this visit.    Subjective Assessment - 05/31/20 1716    Subjective COVID-19 screening performed upon arrival. Patient arrives to physical therapy with reports of right sided neck pain, shoulder pain and arm pain that began insidiously about 3 weeks ago. Patient reported began around the right shoulder blade region and lower cervical region and pain intermittently goes down the arm. Pain currently is localized to the base of the neck in the past couple of days. Patient reports the ability to perform all ADLs but with pain. Patient reports pain at worst as 6/10 with lying down at night and pain at best is 0/10 with movement. Patinet's goals are to decrase pain and sleep longer without pain.    Pertinent History HTN, osteopenia    Limitations House hold activities;Lifting    Diagnostic tests x-ray: arthritis, bone spurs, possible bulging disc in cevical spine per patient report    Patient Stated Goals decrease  pain and improve sleep    Currently in Pain? Yes    Pain Score 2     Pain Location Neck    Pain Orientation Lower;Right    Pain Descriptors / Indicators Aching    Pain Type Acute pain    Pain Radiating Towards R shoulder    Pain Onset 1 to 4 weeks ago    Pain Frequency Intermittent    Aggravating Factors  lying down at night    Pain Relieving Factors movement    Effect of Pain on Daily Activities "at times when intense"              [facility] PT Assessment - 05/31/20 0001      Assessment   Medical Diagnosis Cervical radiculopathy    Referring Provider (PT) Levy Pupa, PA-C    Onset Date/Surgical Date --   "3 weeks ago" September 2021   Hand Dominance Left    Next MD Visit 07/07/2020    Prior Therapy no      Precautions   Precautions None      Restrictions   Weight Bearing Restrictions No      Balance Screen   Has the patient fallen in the past 6 months No    Has the patient had a decrease in activity level because of a fear of falling?  No    Is the patient  reluctant to leave their home because of a fear of falling?  No      Home Ecologist residence      Prior Function   Level of Independence Independent      Observation/Other Assessments   Focus on Therapeutic Outcomes (FOTO)  41% limitation      Posture/Postural Control   Posture/Postural Control Postural limitations    Postural Limitations Rounded Shoulders;Forward head      ROM / Strength   AROM / PROM / Strength AROM;Strength      AROM   Overall AROM  Deficits    AROM Assessment Site Cervical    Cervical Flexion 19   (+) pull on R side   Cervical Extension 23    Cervical - Right Side Bend 20    Cervical - Left Side Bend 14    Cervical - Right Rotation 58    Cervical - Left Rotation 65      Strength   Overall Strength Deficits    Strength Assessment Site Shoulder    Right/Left Shoulder Right;Left    Right Shoulder Flexion 3+/5    Right Shoulder ABduction 3+/5     Right Shoulder Internal Rotation 4-/5    Right Shoulder External Rotation 4-/5    Left Shoulder Flexion 3+/5    Left Shoulder ABduction 4-/5    Left Shoulder Internal Rotation 4-/5    Left Shoulder External Rotation 4-/5      Palpation   Palpation comment increased tone to R UT, tenderness to cervical paraspinals and suboccipitals.                       Objective measurements completed on examination: See above findings.               PT Education - 05/31/20 1727    Education Details chin tuck, chin tuck with extension, scapular retraction, UT stretch    Person(s) Educated Patient    Methods Explanation;Demonstration;Handout    Comprehension Verbalized understanding;Returned demonstration            PT Short Term Goals - 05/31/20 1741      PT SHORT TERM GOAL #1   Title STG=LTG             PT Long Term Goals - 05/31/20 1741      PT LONG TERM GOAL #1   Title Patient will be independent with HEP.    Time 6    Period Weeks    Status New      PT LONG TERM GOAL #2   Title Patient will demonstrate 65+ degrees of cervical rotation AROM to improve scanning environment and looking over shoulder while driving.    Time 6    Period Weeks    Status New      PT LONG TERM GOAL #3   Title Patient will demonstrate 4+/5 bilateral UE MMT in all planes to improve stability during functional tasks.    Time 6    Period Weeks    Status New      PT LONG TERM GOAL #4   Title Patient will report ability to perform ADLs and home tasks with cervical pain less than or equal to 2/10.    Time 6    Period Weeks    Status New                  Plan - 05/31/20 1732    Clinical  Impression Statement Patient is a 69 year old left handed female who presents to physical therapy with right sided cervical pain that radiates down R arm that began about 3 weeks ago. Patient with decreased cervical ROM and decreased UE MMT in all planes. Patient noted with tenderness  to palpation to R UT region, cervical paraspinals, and suboccipitals. Patient and PT discussed plan of care and discussed HEP to which patient reported understanding. Patient would benefit from skilled physical therapy to address deficits and patient's goals.    Personal Factors and Comorbidities Age;Comorbidity 2    Comorbidities HTN, osteopenia    Stability/Clinical Decision Making Stable/Uncomplicated    Clinical Decision Making Low    Rehab Potential Good    PT Frequency 2x / week    PT Duration 6 weeks    PT Treatment/Interventions ADLs/Self Care Home Management;Moist Heat;Ultrasound;Electrical Stimulation;Cryotherapy;Manual techniques;Neuromuscular re-education;Therapeutic exercise;Therapeutic activities;Patient/family education;Passive range of motion;Dry needling    PT Next Visit Plan UBE, postural exercises, cervical stretching and ROM, manual traction, avoid mechanical due to osteopenia. Modalities PRN for pain relief    PT Home Exercise Plan see patient education section    Consulted and Agree with Plan of Care Patient           Patient will benefit from skilled therapeutic intervention in order to improve the following deficits and impairments:  Decreased activity tolerance, Decreased mobility, Pain, Postural dysfunction, Decreased range of motion  Visit Diagnosis: Radiculopathy, cervical region - Plan: PT plan of care cert/re-cert  Cervicalgia - Plan: PT plan of care cert/re-cert  Abnormal posture - Plan: PT plan of care cert/re-cert  Muscle weakness (generalized) - Plan: PT plan of care cert/re-cert     Problem List Patient Active Problem List   Diagnosis Date Noted  . Hypochloremia   . Hyponatremia   . UTI (urinary tract infection) 12/29/2019  . BMI 34.0-34.9,adult 03/28/2015  . GAD (generalized anxiety disorder) 01/07/2013  . Hypertension 01/07/2013  . GERD (gastroesophageal reflux disease) 01/07/2013    Gabriela Eves, PT, DPT 05/31/2020, 5:53 PM  [facility] [place], [place], [address] Phone: [phone]   Fax:  [phone]  Name: Kelly Lewis MRN: [id number] Date of Birth: 05/19/1951

## 2020-06-05 ENCOUNTER — Encounter: Payer: Self-pay | Admitting: Nurse Practitioner

## 2020-06-05 ENCOUNTER — Other Ambulatory Visit: Payer: Self-pay

## 2020-06-05 ENCOUNTER — Ambulatory Visit (INDEPENDENT_AMBULATORY_CARE_PROVIDER_SITE_OTHER): Payer: Medicare PPO | Admitting: Nurse Practitioner

## 2020-06-05 VITALS — BP 148/77 | HR 80 | Temp 97.7°F | Resp 20 | Ht 66.0 in | Wt 198.0 lb

## 2020-06-05 DIAGNOSIS — E039 Hypothyroidism, unspecified: Secondary | ICD-10-CM

## 2020-06-05 DIAGNOSIS — E785 Hyperlipidemia, unspecified: Secondary | ICD-10-CM

## 2020-06-05 DIAGNOSIS — K219 Gastro-esophageal reflux disease without esophagitis: Secondary | ICD-10-CM | POA: Diagnosis not present

## 2020-06-05 DIAGNOSIS — F411 Generalized anxiety disorder: Secondary | ICD-10-CM | POA: Diagnosis not present

## 2020-06-05 DIAGNOSIS — I10 Essential (primary) hypertension: Secondary | ICD-10-CM | POA: Diagnosis not present

## 2020-06-05 DIAGNOSIS — Z23 Encounter for immunization: Secondary | ICD-10-CM

## 2020-06-05 DIAGNOSIS — Z6831 Body mass index (BMI) 31.0-31.9, adult: Secondary | ICD-10-CM

## 2020-06-05 DIAGNOSIS — E1169 Type 2 diabetes mellitus with other specified complication: Secondary | ICD-10-CM

## 2020-06-05 MED ORDER — LORAZEPAM 0.5 MG PO TABS: 0.5000 mg | ORAL_TABLET | Freq: Two times a day (BID) | ORAL | 5 refills | Status: DC | PRN

## 2020-06-05 MED ORDER — LISINOPRIL 10 MG PO TABS: 10.0000 mg | ORAL_TABLET | Freq: Every day | ORAL | 1 refills | Status: DC

## 2020-06-05 MED ORDER — OMEPRAZOLE 20 MG PO CPDR: 20.0000 mg | DELAYED_RELEASE_CAPSULE | Freq: Every day | ORAL | 1 refills | Status: DC

## 2020-06-05 MED ORDER — LEVOTHYROXINE SODIUM 100 MCG PO TABS: 100.0000 ug | ORAL_TABLET | Freq: Every day | ORAL | 1 refills | Status: DC

## 2020-06-05 NOTE — Progress Notes (Signed)
Subjective:    Patient ID: Kelly Lewis, female    DOB: [birth year], 69 y.o.   MRN: [id number]   Chief Complaint: Medical Management of Chronic Issues    HPI:  1. Primary hypertension No c/o chest pain, sob or headache. Does not check blood pressure at home. BP Readings from Last 3 Encounters:  06/05/20 (!) 148/77  01/14/20 133/71  12/31/19 136/74    2. Gastroesophageal reflux disease without esophagitis Is on omeprazole daily and is doing well.  3. GAD (generalized anxiety disorder) Is on ativan daily and is doing well. GAD 7 : Generalized Anxiety Score 06/05/2020 11/23/2019  Nervous, Anxious, on Edge 0 0  Control/stop worrying 0 0  Worry too much - different things 0 0  Trouble relaxing 0 0  Restless 0 0  Easily annoyed or irritable 0 0  Afraid - awful might happen 0 0  Total GAD 7 Score 0 0  Anxiety Difficulty Not difficult at all Not difficult at all     4. BMI 31.0-31.9,adult No recent weight changes Wt Readings from Last 3 Encounters:  06/05/20 198 lb (89.8 kg)  01/14/20 196 lb 6.4 oz (89.1 kg)  12/29/19 195 lb (88.5 kg)   BMI Readings from Last 3 Encounters:  06/05/20 31.96 kg/m  01/14/20 31.22 kg/m  12/29/19 31.00 kg/m       Outpatient Encounter Medications as of 06/05/2020  Medication Sig  . Cholecalciferol (VITAMIN D3) 2000 UNITS TABS Take 2,000 Units by mouth daily.   Marland Kitchen ibuprofen (ADVIL,MOTRIN) 200 MG tablet Take 200 mg by mouth every 6 (six) hours as needed for mild pain.   Marland Kitchen levothyroxine (SYNTHROID) 100 MCG tablet TAKE 1 TABLET BY MOUTH EVERY DAY  . lisinopril (ZESTRIL) 10 MG tablet Take 1 tablet (10 mg total) by mouth daily. (Patient taking differently: Take 5 mg by mouth daily. )  . LORazepam (ATIVAN) 0.5 MG tablet Take 1 tablet (0.5 mg total) by mouth 2 (two) times daily as needed. (Patient taking differently: Take 0.5 mg by mouth 2 (two) times daily as needed for anxiety. )  . omeprazole (PRILOSEC) 20 MG capsule Take 1 capsule (20 mg  total) by mouth daily.     Past Surgical History:  Procedure Laterality Date  . bladder tacked      Family History  Problem Relation Age of Onset  . Diabetes Mother   . Pneumonia Father   . Cancer Father        skin  . Cancer Sister        skin    New complaints: None today  Social history: Lives with her husband  Controlled substance contract: n/a    Review of Systems  Constitutional: Negative for diaphoresis.  Eyes: Negative for pain.  Respiratory: Negative for shortness of breath.   Cardiovascular: Negative for chest pain, palpitations and leg swelling.  Gastrointestinal: Negative for abdominal pain.  Endocrine: Negative for polydipsia.  Skin: Negative for rash.  Neurological: Negative for dizziness, weakness and headaches.  Hematological: Does not bruise/bleed easily.  All other systems reviewed and are negative.      Objective:   Physical Exam Vitals and nursing note reviewed. Exam conducted with a chaperone present.  Constitutional:      General: She is not in acute distress.    Appearance: Normal appearance. She is well-developed.  HENT:     Head: Normocephalic.     Nose: Nose normal.  Eyes:     Pupils: Pupils are equal, round, and reactive  to light.  Neck:     Vascular: No carotid bruit or JVD.  Cardiovascular:     Rate and Rhythm: Normal rate and regular rhythm.     Heart sounds: Normal heart sounds.  Pulmonary:     Effort: Pulmonary effort is normal. No respiratory distress.     Breath sounds: Normal breath sounds. No wheezing or rales.  Chest:     Chest wall: No tenderness.  Abdominal:     General: Bowel sounds are normal. There is no distension or abdominal bruit.     Palpations: Abdomen is soft. There is no hepatomegaly, splenomegaly, mass or pulsatile mass.     Tenderness: There is no abdominal tenderness.  Musculoskeletal:        General: Normal range of motion.     Cervical back: Normal range of motion and neck supple.   Lymphadenopathy:     Cervical: No cervical adenopathy.  Skin:    General: Skin is warm and dry.  Neurological:     Mental Status: She is alert and oriented to person, place, and time.     Deep Tendon Reflexes: Reflexes are normal and symmetric.  Psychiatric:        Behavior: Behavior normal.        Thought Content: Thought content normal.        Judgment: Judgment normal.    BP (!) 148/77   Pulse 80   Temp 97.7 F (36.5 C) (Temporal)   Resp 20   Ht _0  (1.676 m)   Wt 198 lb (89.8 kg)   SpO2 100%   BMI 31.96 kg/m         Assessment & Plan:  KRISTIANA JACKO comes in today with chief complaint of Medical Management of Chronic Issues   Diagnosis and orders addressed:  1. Essential hypertension Low sodium diet - lisinopril (ZESTRIL) 10 MG tablet; Take 1 tablet (10 mg total) by mouth daily.  Dispense: 90 tablet; Refill: 1 - CBC with Differential/Platelet - CMP14+EGFR - Lipid panel  2. Gastroesophageal reflux disease without esophagitis Avoid spicy foods Do not eat 2 hours prior to bedtime - omeprazole (PRILOSEC) 20 MG capsule; Take 1 capsule (20 mg total) by mouth daily.  Dispense: 90 capsule; Refill: 1  3. GAD (generalized anxiety disorder) Stress management - LORazepam (ATIVAN) 0.5 MG tablet; Take 1 tablet (0.5 mg total) by mouth 2 (two) times daily as needed for anxiety.  Dispense: 60 tablet; Refill: 5  4. Acquired hypothyroidism labe oending - levothyroxine (SYNTHROID) 100 MCG tablet; Take 1 tablet (100 mcg total) by mouth daily.  Dispense: 90 tablet; Refill: 1 - Thyroid Panel With TSH  5. BMI 31.0-31.9,adult Discussed diet and exercise for person with BMI >25 Will recheck weight in 3-6 months    Labs pending Health Maintenance reviewed- will schedule colonoscopy after gets covid booster Diet and exercise encouraged  Follow up plan: 6 months   [place], FNP

## 2020-06-05 NOTE — Patient Instructions (Signed)
DASH Eating Plan DASH stands for "Dietary Approaches to Stop Hypertension." The DASH eating plan is a healthy eating plan that has been shown to reduce high blood pressure (hypertension). It may also reduce your risk for type 2 diabetes, heart disease, and stroke. The DASH eating plan may also help with weight loss. What are tips for following this plan?  General guidelines  Avoid eating more than 2,300 mg (milligrams) of salt (sodium) a day. If you have hypertension, you may need to reduce your sodium intake to 1,500 mg a day.  Limit alcohol intake to no more than 1 drink a day for nonpregnant women and 2 drinks a day for men. One drink equals 12 oz of beer, 5 oz of wine, or 1 oz of hard liquor.  Work with your health care provider to maintain a healthy body weight or to lose weight. Ask what an ideal weight is for you.  Get at least 30 minutes of exercise that causes your heart to beat faster (aerobic exercise) most days of the week. Activities may include walking, swimming, or biking.  Work with your health care provider or diet and nutrition specialist (dietitian) to adjust your eating plan to your individual calorie needs. Reading food labels   Check food labels for the amount of sodium per serving. Choose foods with less than 5 percent of the Daily Value of sodium. Generally, foods with less than 300 mg of sodium per serving fit into this eating plan.  To find whole grains, look for the word "whole" as the first word in the ingredient list. Shopping  Buy products labeled as "low-sodium" or "no salt added."  Buy fresh foods. Avoid canned foods and premade or frozen meals. Cooking  Avoid adding salt when cooking. Use salt-free seasonings or herbs instead of table salt or sea salt. Check with your health care provider or pharmacist before using salt substitutes.  Do not fry foods. Cook foods using healthy methods such as baking, boiling, grilling, and broiling instead.  Cook with  heart-healthy oils, such as olive, canola, soybean, or sunflower oil. Meal planning  Eat a balanced diet that includes: ? 5 or more servings of fruits and vegetables each day. At each meal, try to fill half of your plate with fruits and vegetables. ? Up to 6-8 servings of whole grains each day. ? Less than 6 oz of lean meat, poultry, or fish each day. A 3-oz serving of meat is about the same size as a deck of cards. One egg equals 1 oz. ? 2 servings of low-fat dairy each day. ? A serving of nuts, seeds, or beans 5 times each week. ? Heart-healthy fats. Healthy fats called Omega-3 fatty acids are found in foods such as flaxseeds and coldwater fish, like sardines, salmon, and mackerel.  Limit how much you eat of the following: ? Canned or prepackaged foods. ? Food that is high in trans fat, such as fried foods. ? Food that is high in saturated fat, such as fatty meat. ? Sweets, desserts, sugary drinks, and other foods with added sugar. ? Full-fat dairy products.  Do not salt foods before eating.  Try to eat at least 2 vegetarian meals each week.  Eat more home-cooked food and less restaurant, buffet, and fast food.  When eating at a restaurant, ask that your food be prepared with less salt or no salt, if possible. What foods are recommended? The items listed may not be a complete list. Talk with your dietitian about   what dietary choices are best for you. Grains Whole-grain or whole-wheat bread. Whole-grain or whole-wheat pasta. Brown rice. Oatmeal. Quinoa. Bulgur. Whole-grain and low-sodium cereals. Pita bread. Low-fat, low-sodium crackers. Whole-wheat flour tortillas. Vegetables Fresh or frozen vegetables (raw, steamed, roasted, or grilled). Low-sodium or reduced-sodium tomato and vegetable juice. Low-sodium or reduced-sodium tomato sauce and tomato paste. Low-sodium or reduced-sodium canned vegetables. Fruits All fresh, dried, or frozen fruit. Canned fruit in natural juice (without  added sugar). Meat and other protein foods Skinless chicken or turkey. Ground chicken or turkey. Pork with fat trimmed off. Fish and seafood. Egg whites. Dried beans, peas, or lentils. Unsalted nuts, nut butters, and seeds. Unsalted canned beans. Lean cuts of beef with fat trimmed off. Low-sodium, lean deli meat. Dairy Low-fat (1%) or fat-free (skim) milk. Fat-free, low-fat, or reduced-fat cheeses. Nonfat, low-sodium ricotta or cottage cheese. Low-fat or nonfat yogurt. Low-fat, low-sodium cheese. Fats and oils Soft margarine without trans fats. Vegetable oil. Low-fat, reduced-fat, or light mayonnaise and salad dressings (reduced-sodium). Canola, safflower, olive, soybean, and sunflower oils. Avocado. Seasoning and other foods Herbs. Spices. Seasoning mixes without salt. Unsalted popcorn and pretzels. Fat-free sweets. What foods are not recommended? The items listed may not be a complete list. Talk with your dietitian about what dietary choices are best for you. Grains Baked goods made with fat, such as croissants, muffins, or some breads. Dry pasta or rice meal packs. Vegetables Creamed or fried vegetables. Vegetables in a cheese sauce. Regular canned vegetables (not low-sodium or reduced-sodium). Regular canned tomato sauce and paste (not low-sodium or reduced-sodium). Regular tomato and vegetable juice (not low-sodium or reduced-sodium). Pickles. Olives. Fruits Canned fruit in a light or heavy syrup. Fried fruit. Fruit in cream or butter sauce. Meat and other protein foods Fatty cuts of meat. Ribs. Fried meat. Bacon. Sausage. Bologna and other processed lunch meats. Salami. Fatback. Hotdogs. Bratwurst. Salted nuts and seeds. Canned beans with added salt. Canned or smoked fish. Whole eggs or egg yolks. Chicken or turkey with skin. Dairy Whole or 2% milk, cream, and half-and-half. Whole or full-fat cream cheese. Whole-fat or sweetened yogurt. Full-fat cheese. Nondairy creamers. Whipped toppings.  Processed cheese and cheese spreads. Fats and oils Butter. Stick margarine. Lard. Shortening. Ghee. Bacon fat. Tropical oils, such as coconut, palm kernel, or palm oil. Seasoning and other foods Salted popcorn and pretzels. Onion salt, garlic salt, seasoned salt, table salt, and sea salt. Worcestershire sauce. Tartar sauce. Barbecue sauce. Teriyaki sauce. Soy sauce, including reduced-sodium. Steak sauce. Canned and packaged gravies. Fish sauce. Oyster sauce. Cocktail sauce. Horseradish that you find on the shelf. Ketchup. Mustard. Meat flavorings and tenderizers. Bouillon cubes. Hot sauce and Tabasco sauce. Premade or packaged marinades. Premade or packaged taco seasonings. Relishes. Regular salad dressings. Where to find more information:  National Heart, Lung, and Blood Institute: www.nhlbi.nih.gov  American Heart Association: www.heart.org Summary  The DASH eating plan is a healthy eating plan that has been shown to reduce high blood pressure (hypertension). It may also reduce your risk for type 2 diabetes, heart disease, and stroke.  With the DASH eating plan, you should limit salt (sodium) intake to 2,300 mg a day. If you have hypertension, you may need to reduce your sodium intake to 1,500 mg a day.  When on the DASH eating plan, aim to eat more fresh fruits and vegetables, whole grains, lean proteins, low-fat dairy, and heart-healthy fats.  Work with your health care provider or diet and nutrition specialist (dietitian) to adjust your eating plan to your   individual calorie needs. This information is not intended to replace advice given to you by your health care provider. Make sure you discuss any questions you have with your health care provider. Document Revised: 08/01/2017 Document Reviewed: 08/12/2016 Elsevier Patient Education  2020 Elsevier Inc.  

## 2020-06-06 ENCOUNTER — Ambulatory Visit: Payer: Medicare PPO | Attending: Chiropractic Medicine | Admitting: Physical Therapy

## 2020-06-06 ENCOUNTER — Other Ambulatory Visit: Payer: Self-pay

## 2020-06-06 ENCOUNTER — Encounter: Payer: Self-pay | Admitting: Physical Therapy

## 2020-06-06 DIAGNOSIS — R293 Abnormal posture: Secondary | ICD-10-CM | POA: Insufficient documentation

## 2020-06-06 DIAGNOSIS — M5412 Radiculopathy, cervical region: Secondary | ICD-10-CM | POA: Insufficient documentation

## 2020-06-06 DIAGNOSIS — M542 Cervicalgia: Secondary | ICD-10-CM | POA: Diagnosis not present

## 2020-06-06 DIAGNOSIS — M6281 Muscle weakness (generalized): Secondary | ICD-10-CM | POA: Diagnosis not present

## 2020-06-06 LAB — CMP14+EGFR
ALT: 19 IU/L (ref 0–32)
AST: 29 IU/L (ref 0–40)
Albumin/Globulin Ratio: 1.7 (ref 1.2–2.2)
Albumin: 4.5 g/dL (ref 3.8–4.8)
Alkaline Phosphatase: 213 IU/L — ABNORMAL HIGH (ref 44–121)
BUN/Creatinine Ratio: 13 (ref 12–28)
BUN: 9 mg/dL (ref 8–27)
Bilirubin Total: 0.6 mg/dL (ref 0.0–1.2)
CO2: 22 mmol/L (ref 20–29)
Calcium: 9.8 mg/dL (ref 8.7–10.3)
Chloride: 96 mmol/L (ref 96–106)
Creatinine, Ser: 0.67 mg/dL (ref 0.57–1.00)
GFR calc Af Amer: 104 mL/min/{1.73_m2} (ref >59)
GFR calc non Af Amer: 90 mL/min/{1.73_m2} (ref >59)
Globulin, Total: 2.7 g/dL (ref 1.5–4.5)
Glucose: 93 mg/dL (ref 65–99)
Potassium: 4.4 mmol/L (ref 3.5–5.2)
Sodium: 131 mmol/L — ABNORMAL LOW (ref 134–144)
Total Protein: 7.2 g/dL (ref 6.0–8.5)

## 2020-06-06 LAB — CBC WITH DIFFERENTIAL/PLATELET
Basophils Absolute: 0 10*3/uL (ref 0.0–0.2)
Basos: 1 %
EOS (ABSOLUTE): 0 10*3/uL (ref 0.0–0.4)
Eos: 1 %
Hematocrit: 38 % (ref 34.0–46.6)
Hemoglobin: 12.8 g/dL (ref 11.1–15.9)
Immature Grans (Abs): 0 10*3/uL (ref 0.0–0.1)
Immature Granulocytes: 0 %
Lymphocytes Absolute: 1.4 10*3/uL (ref 0.7–3.1)
Lymphs: 25 %
MCH: 30.2 pg (ref 26.6–33.0)
MCHC: 33.7 g/dL (ref 31.5–35.7)
MCV: 90 fL (ref 79–97)
Monocytes Absolute: 0.4 10*3/uL (ref 0.1–0.9)
Monocytes: 7 %
Neutrophils Absolute: 3.6 10*3/uL (ref 1.4–7.0)
Neutrophils: 66 %
Platelets: 104 10*3/uL — ABNORMAL LOW (ref 150–450)
RBC: 4.24 x10E6/uL (ref 3.77–5.28)
RDW: 12.6 % (ref 11.7–15.4)
WBC: 5.4 10*3/uL (ref 3.4–10.8)

## 2020-06-06 LAB — LIPID PANEL
Chol/HDL Ratio: 3.4 ratio (ref 0.0–4.4)
Cholesterol, Total: 210 mg/dL — ABNORMAL HIGH (ref 100–199)
HDL: 61 mg/dL (ref >39)
LDL Chol Calc (NIH): 133 mg/dL — ABNORMAL HIGH (ref 0–99)
Triglycerides: 92 mg/dL (ref 0–149)
VLDL Cholesterol Cal: 16 mg/dL (ref 5–40)

## 2020-06-06 LAB — THYROID PANEL WITH TSH
Free Thyroxine Index: 3 (ref 1.2–4.9)
T3 Uptake Ratio: 22 % — ABNORMAL LOW (ref 24–39)
T4, Total: 13.7 ug/dL — ABNORMAL HIGH (ref 4.5–12.0)
TSH: 0.764 u[IU]/mL (ref 0.450–4.500)

## 2020-06-06 MED ORDER — ATORVASTATIN CALCIUM 40 MG PO TABS: 40.0000 mg | ORAL_TABLET | Freq: Every day | ORAL | 1 refills | Status: DC

## 2020-06-06 NOTE — Therapy (Signed)
[facility] [place], [place], [address] Phone: [phone]   Fax:  [phone]  Physical Therapy Treatment  Patient Details  Name: Kelly Lewis MRN: [id number] Date of Birth: Jan 14, 1951 Referring Provider (PT): Levy Pupa, PA-C   Encounter Date: 06/06/2020   PT End of Session - 06/06/20 0809    Visit Number 2    Number of Visits 12    Date for PT Re-Evaluation 07/19/20    Authorization Type [employer] 05/31/2020-07/21/2020    Authorization - Visit Number --    Authorization - Number of Visits --    PT Start Time 0730    PT Stop Time 0817    PT Time Calculation (min) 47 min    Activity Tolerance Patient tolerated treatment well    Behavior During Therapy [facility] for tasks assessed/performed           Past Medical History:  Diagnosis Date  . Chronic anxiety   . GERD (gastroesophageal reflux disease)   . H/O degenerative disc disease   . Hypertension   . Plantar fasciitis   . Thyroid disease     Past Surgical History:  Procedure Laterality Date  . bladder tacked      There were no vitals filed for this visit.   Subjective Assessment - 06/06/20 0808    Subjective COVID-19 screening performed upon arrival. Patient arrives doing alright. States she got her flu shot yesterday which is making her a little more sore.    Pertinent History HTN, osteopenia    Limitations House hold activities;Lifting    Diagnostic tests x-ray: arthritis, bone spurs, possible bulging disc in cevical spine per patient report    Patient Stated Goals decrease pain and improve sleep    Currently in Pain? Yes   did not provide number on pain scale             [facility] PT Assessment - 06/06/20 0001      Assessment   Medical Diagnosis Cervical radiculopathy    Referring Provider (PT) Levy Pupa, PA-C    Hand Dominance Left    Next MD Visit 07/07/2020    Prior Therapy no      Precautions   Precautions None                          [facility] Adult PT Treatment/Exercise - 06/06/20 0001      Exercises   Exercises Neck      Neck Exercises: Machines for Strengthening   UBE (Upper Arm Bike) 120 RPM x8 mins (4 fwd, 4 bwd)      Neck Exercises: Seated   Neck Retraction 10 reps;5 secs    Neck Retraction Limitations against pillow    Cervical Rotation Both;10 reps    Cervical Rotation Limitations against pillow      Modalities   Modalities Ultrasound;Teacher, English as a foreign language Location lower cervical and UTs    Electrical Stimulation Action IFC    Electrical Stimulation Parameters 80-150 hz x10 mins    Electrical Stimulation Goals Pain;Tone      Ultrasound   Ultrasound Location bilateral UTs    Ultrasound Parameters Combo e-stim/ultrasound 50%, 3.3 mhz, 1.5 w/cm2 x10 mins    Ultrasound Goals Pain                    PT Short Term Goals - 05/31/20 1741  PT SHORT TERM GOAL #1   Title STG=LTG             PT Long Term Goals - 05/31/20 1741      PT LONG TERM GOAL #1   Title Patient will be independent with HEP.    Time 6    Period Weeks    Status New      PT LONG TERM GOAL #2   Title Patient will demonstrate 65+ degrees of cervical rotation AROM to improve scanning environment and looking over shoulder while driving.    Time 6    Period Weeks    Status New      PT LONG TERM GOAL #3   Title Patient will demonstrate 4+/5 bilateral UE MMT in all planes to improve stability during functional tasks.    Time 6    Period Weeks    Status New      PT LONG TERM GOAL #4   Title Patient will report ability to perform ADLs and home tasks with cervical pain less than or equal to 2/10.    Time 6    Period Weeks    Status New                 Plan - 06/06/20 5409    Clinical Impression Statement Patient arrives with reports of pain of lower cervical pain but responded well TEs. Patient demonstrated good form  with exercises but noted with less L sided rotation in comparison to right. Patient responded well to combo US/E-stim to address tone and pain. Normal response to modalities upon removal.    Personal Factors and Comorbidities Age;Comorbidity 2    Comorbidities HTN, osteopenia    Stability/Clinical Decision Making Stable/Uncomplicated    Clinical Decision Making Low    Rehab Potential Good    PT Frequency 2x / week    PT Duration 6 weeks    PT Treatment/Interventions ADLs/Self Care Home Management;Moist Heat;Ultrasound;Electrical Stimulation;Cryotherapy;Manual techniques;Neuromuscular re-education;Therapeutic exercise;Therapeutic activities;Patient/family education;Passive range of motion;Dry needling    PT Next Visit Plan UBE, postural exercises, cervical stretching and ROM, manual traction, avoid mechanical due to osteopenia. Modalities PRN for pain relief    PT Home Exercise Plan see patient education section    Consulted and Agree with Plan of Care Patient           Patient will benefit from skilled therapeutic intervention in order to improve the following deficits and impairments:  Decreased activity tolerance, Decreased mobility, Pain, Postural dysfunction, Decreased range of motion  Visit Diagnosis: Radiculopathy, cervical region  Cervicalgia  Abnormal posture  Muscle weakness (generalized)     Problem List Patient Active Problem List   Diagnosis Date Noted  . BMI 34.0-34.9,adult 03/28/2015  . GAD (generalized anxiety disorder) 01/07/2013  . Hypertension 01/07/2013  . GERD (gastroesophageal reflux disease) 01/07/2013    Gabriela Eves, PT, DPT 06/06/2020, 8:30 AM  [facility] [place], [place], [address] Phone: [phone]   Fax:  [phone]  Name: Kelly Lewis MRN: [id number] Date of Birth: 1951/02/22

## 2020-06-06 NOTE — Addendum Note (Signed)
Addended by: Chevis Pretty on: 06/06/2020 10:08 AM   Modules accepted: Orders

## 2020-06-08 ENCOUNTER — Ambulatory Visit: Payer: Medicare PPO | Admitting: Physical Therapy

## 2020-06-08 ENCOUNTER — Other Ambulatory Visit: Payer: Self-pay

## 2020-06-08 ENCOUNTER — Encounter: Payer: Self-pay | Admitting: Physical Therapy

## 2020-06-08 DIAGNOSIS — M5412 Radiculopathy, cervical region: Secondary | ICD-10-CM

## 2020-06-08 DIAGNOSIS — M542 Cervicalgia: Secondary | ICD-10-CM | POA: Diagnosis not present

## 2020-06-08 DIAGNOSIS — R293 Abnormal posture: Secondary | ICD-10-CM | POA: Diagnosis not present

## 2020-06-08 DIAGNOSIS — M6281 Muscle weakness (generalized): Secondary | ICD-10-CM

## 2020-06-08 NOTE — Therapy (Signed)
[facility] [place], [place], [address] Phone: [phone]   Fax:  [phone]  Physical Therapy Treatment  Patient Details  Name: Kelly Lewis MRN: [id number] Date of Birth: [birth year] Referring Provider (PT): Levy Pupa, PA-C   Encounter Date: 06/08/2020   PT End of Session - 06/08/20 0828    Visit Number 3    Number of Visits 12    Date for PT Re-Evaluation 07/19/20    Authorization Type [employer] 05/31/2020-07/21/2020    PT Start Time 0732    PT Stop Time 0817    PT Time Calculation (min) 45 min    Activity Tolerance Patient tolerated treatment well    Behavior During Therapy [facility] for tasks assessed/performed           Past Medical History:  Diagnosis Date  . Chronic anxiety   . GERD (gastroesophageal reflux disease)   . H/O degenerative disc disease   . Hypertension   . Plantar fasciitis   . Thyroid disease     Past Surgical History:  Procedure Laterality Date  . bladder tacked      There were no vitals filed for this visit.   Subjective Assessment - 06/08/20 0748    Subjective COVID-19 screening performed upon arrival. Patient reported feeling really good after last session but neck and UT region began to tighten up later the next day. Reports little bit of pain upon arrival.    Pertinent History HTN, osteopenia    Limitations House hold activities;Lifting    Diagnostic tests x-ray: arthritis, bone spurs, possible bulging disc in cevical spine per patient report    Patient Stated Goals decrease pain and improve sleep    Currently in Pain? Yes   "a little bit"             [facility] PT Assessment - 06/08/20 0001      Assessment   Medical Diagnosis Cervical radiculopathy    Referring Provider (PT) Levy Pupa, PA-C    Hand Dominance Left    Next MD Visit 07/07/2020    Prior Therapy no      Precautions   Precautions None                         [facility] Adult PT  Treatment/Exercise - 06/08/20 0001      Exercises   Exercises Neck      Neck Exercises: Machines for Strengthening   UBE (Upper Arm Bike) 120 RPM x8 mins (4 fwd, 4 bwd)      Neck Exercises: Theraband   Shoulder Extension 20 reps;Other (comment)    Shoulder Extension Limitations Blue XTS    Rows 20 reps    Rows Limitations Green XTS      Neck Exercises: Standing   Other Standing Exercises Bilateral D2 flexion with chin tuck x20 followed by horizontal abduction with chin tuck x20      Modalities   Modalities Ultrasound;Teacher, English as a foreign language Location lower cervical and UTs    Electrical Stimulation Action IFC    Electrical Stimulation Parameters 80-150 hz x10 mins    Electrical Stimulation Goals Pain;Tone      Ultrasound   Ultrasound Location bilateral UTs    Ultrasound Parameters combo e-stim/US 50%, 1.5 w/cm2 35mhz x10 mins    Ultrasound Goals Pain  PT Short Term Goals - 05/31/20 1741      PT SHORT TERM GOAL #1   Title STG=LTG             PT Long Term Goals - 05/31/20 1741      PT LONG TERM GOAL #1   Title Patient will be independent with HEP.    Time 6    Period Weeks    Status New      PT LONG TERM GOAL #2   Title Patient will demonstrate 65+ degrees of cervical rotation AROM to improve scanning environment and looking over shoulder while driving.    Time 6    Period Weeks    Status New      PT LONG TERM GOAL #3   Title Patient will demonstrate 4+/5 bilateral UE MMT in all planes to improve stability during functional tasks.    Time 6    Period Weeks    Status New      PT LONG TERM GOAL #4   Title Patient will report ability to perform ADLs and home tasks with cervical pain less than or equal to 2/10.    Time 6    Period Weeks    Status New                 Plan - 06/08/20 3295    Clinical Impression Statement Patient responded well to therapy session and was  able to progress strengthening and postural exercises. Patient required intermittent verbal cuing for technique for D2 flexion and was able to perform improved form for remaining reps. No adverse effects upon removal of modalities.    Personal Factors and Comorbidities Age;Comorbidity 2    Comorbidities HTN, osteopenia    Stability/Clinical Decision Making Stable/Uncomplicated    Clinical Decision Making Low    Rehab Potential Good    PT Frequency 2x / week    PT Duration 6 weeks    PT Treatment/Interventions ADLs/Self Care Home Management;Moist Heat;Ultrasound;Electrical Stimulation;Cryotherapy;Manual techniques;Neuromuscular re-education;Therapeutic exercise;Therapeutic activities;Patient/family education;Passive range of motion;Dry needling    PT Next Visit Plan UBE, postural exercises, cervical stretching and ROM, manual traction, avoid mechanical due to osteopenia. Modalities PRN for pain relief    PT Home Exercise Plan see patient education section    Consulted and Agree with Plan of Care Patient           Patient will benefit from skilled therapeutic intervention in order to improve the following deficits and impairments:  Decreased activity tolerance, Decreased mobility, Pain, Postural dysfunction, Decreased range of motion  Visit Diagnosis: Radiculopathy, cervical region  Cervicalgia  Abnormal posture  Muscle weakness (generalized)     Problem List Patient Active Problem List   Diagnosis Date Noted  . BMI 34.0-34.9,adult 03/28/2015  . GAD (generalized anxiety disorder) 01/07/2013  . Hypertension 01/07/2013  . GERD (gastroesophageal reflux disease) 01/07/2013    Gabriela Eves, PT, DPT 06/08/2020, 8:35 AM  [facility] [place], [place], [address] Phone: [phone]   Fax:  [phone]  Name: Kelly Lewis MRN: [id number] Date of Birth: 10-29-1950

## 2020-06-13 ENCOUNTER — Other Ambulatory Visit: Payer: Self-pay

## 2020-06-13 ENCOUNTER — Encounter: Payer: Self-pay | Admitting: Physical Therapy

## 2020-06-13 ENCOUNTER — Ambulatory Visit: Payer: Medicare PPO | Admitting: Physical Therapy

## 2020-06-13 DIAGNOSIS — M5412 Radiculopathy, cervical region: Secondary | ICD-10-CM

## 2020-06-13 DIAGNOSIS — M542 Cervicalgia: Secondary | ICD-10-CM | POA: Diagnosis not present

## 2020-06-13 DIAGNOSIS — R293 Abnormal posture: Secondary | ICD-10-CM | POA: Diagnosis not present

## 2020-06-13 DIAGNOSIS — M6281 Muscle weakness (generalized): Secondary | ICD-10-CM | POA: Diagnosis not present

## 2020-06-13 NOTE — Therapy (Signed)
[facility] [place], [place], [address] Phone: [phone]   Fax:  [phone]  Physical Therapy Treatment  Patient Details  Name: Kelly Lewis MRN: [id number] Date of Birth: Jan 25, 1951 Referring Provider (PT): Levy Pupa, PA-C   Encounter Date: 06/13/2020   PT End of Session - 06/13/20 0915    Visit Number 4    Number of Visits 12    Date for PT Re-Evaluation 07/19/20    Authorization Type [employer] 05/31/2020-07/21/2020    PT Start Time 0730    PT Stop Time 0818    PT Time Calculation (min) 48 min    Activity Tolerance Patient tolerated treatment well    Behavior During Therapy [facility] for tasks assessed/performed           Past Medical History:  Diagnosis Date  . Chronic anxiety   . GERD (gastroesophageal reflux disease)   . H/O degenerative disc disease   . Hypertension   . Plantar fasciitis   . Thyroid disease     Past Surgical History:  Procedure Laterality Date  . bladder tacked      There were no vitals filed for this visit.   Subjective Assessment - 06/13/20 0731    Subjective COVID-19 screening performed upon arrival. Patient feeling tight and achey yesterday and today.    Pertinent History HTN, osteopenia    Limitations House hold activities;Lifting    Diagnostic tests x-ray: arthritis, bone spurs, possible bulging disc in cevical spine per patient report    Patient Stated Goals decrease pain and improve sleep    Currently in Pain? Yes   did not provide number on pain scale   Pain Location Neck    Pain Orientation Lower    Pain Descriptors / Indicators Aching;Tightness    Pain Type Acute pain    Pain Onset 1 to 4 weeks ago    Pain Frequency Intermittent              [facility] PT Assessment - 06/13/20 0001      Assessment   Medical Diagnosis Cervical radiculopathy    Referring Provider (PT) Levy Pupa, PA-C    Hand Dominance Left    Next MD Visit 07/07/2020    Prior Therapy  no      Precautions   Precautions None                         [facility] Adult PT Treatment/Exercise - 06/13/20 0001      Exercises   Exercises Neck      Neck Exercises: Machines for Strengthening   UBE (Upper Arm Bike) 120 RPM x8 mins (4 fwd, 4 bwd)      Neck Exercises: Theraband   Shoulder Extension 20 reps;Other (comment)    Shoulder Extension Limitations Blue XTS    Rows 20 reps    Rows Limitations Green XTS    Other Theraband Exercises bilateral protraction with blue XTS 2x10      Neck Exercises: Standing   Other Standing Exercises Bilateral D2 flexion with chin tuck x20 followed by horizontal abduction with chin tuck x20    Other Standing Exercises cervical rotation with chin tuck against pillow x10      Modalities   Modalities Ultrasound;Teacher, English as a foreign language Location lower cervical and UTs    Electrical Stimulation Action IFC    Electrical Stimulation Parameters 80-150 hz x10  Electrical Stimulation Goals Pain;Tone      Ultrasound   Ultrasound Location bilateral UTs    Ultrasound Parameters combo e-stim/US 100% 1.5 w/cm2 x8 mins    Ultrasound Goals Pain                    PT Short Term Goals - 05/31/20 1741      PT SHORT TERM GOAL #1   Title STG=LTG             PT Long Term Goals - 05/31/20 1741      PT LONG TERM GOAL #1   Title Patient will be independent with HEP.    Time 6    Period Weeks    Status New      PT LONG TERM GOAL #2   Title Patient will demonstrate 65+ degrees of cervical rotation AROM to improve scanning environment and looking over shoulder while driving.    Time 6    Period Weeks    Status New      PT LONG TERM GOAL #3   Title Patient will demonstrate 4+/5 bilateral UE MMT in all planes to improve stability during functional tasks.    Time 6    Period Weeks    Status New      PT LONG TERM GOAL #4   Title Patient will report ability to perform  ADLs and home tasks with cervical pain less than or equal to 2/10.    Time 6    Period Weeks    Status New                 Plan - 06/13/20 0745    Clinical Impression Statement Patient was able to tolerate treatment well though with intermitttent reports of "pulling" in bilateral UTs. Verbal cuing provided for proper form and pacing in which she was able to complete with improved form. Patient provided with intermittent rest breaks to prevent UT compensatory motions. No adverse effects upon removal of modalities.    Personal Factors and Comorbidities Age;Comorbidity 2    Comorbidities HTN, osteopenia    Clinical Decision Making Low    Rehab Potential Good    PT Frequency 2x / week    PT Duration 6 weeks    PT Treatment/Interventions ADLs/Self Care Home Management;Moist Heat;Ultrasound;Electrical Stimulation;Cryotherapy;Manual techniques;Neuromuscular re-education;Therapeutic exercise;Therapeutic activities;Patient/family education;Passive range of motion;Dry needling    PT Next Visit Plan UBE, postural exercises, cervical stretching and ROM, manual traction, avoid mechanical due to osteopenia. Modalities PRN for pain relief    PT Home Exercise Plan see patient education section    Consulted and Agree with Plan of Care Patient           Patient will benefit from skilled therapeutic intervention in order to improve the following deficits and impairments:  Decreased activity tolerance, Decreased mobility, Pain, Postural dysfunction, Decreased range of motion  Visit Diagnosis: No diagnosis found.     Problem List Patient Active Problem List   Diagnosis Date Noted  . BMI 34.0-34.9,adult 03/28/2015  . GAD (generalized anxiety disorder) 01/07/2013  . Hypertension 01/07/2013  . GERD (gastroesophageal reflux disease) 01/07/2013    Gabriela Eves, PT, DPT 06/13/2020, 9:49 AM  [facility] [place], [place],  [address] Phone: [phone]   Fax:  [phone]  Name: Kelly Lewis MRN: [id number] Date of Birth: [birth year]

## 2020-06-15 ENCOUNTER — Encounter: Payer: Self-pay | Admitting: Physical Therapy

## 2020-06-15 ENCOUNTER — Other Ambulatory Visit: Payer: Self-pay

## 2020-06-15 ENCOUNTER — Ambulatory Visit: Payer: Medicare PPO | Admitting: Physical Therapy

## 2020-06-15 DIAGNOSIS — M6281 Muscle weakness (generalized): Secondary | ICD-10-CM | POA: Diagnosis not present

## 2020-06-15 DIAGNOSIS — M5412 Radiculopathy, cervical region: Secondary | ICD-10-CM

## 2020-06-15 DIAGNOSIS — R293 Abnormal posture: Secondary | ICD-10-CM

## 2020-06-15 DIAGNOSIS — M542 Cervicalgia: Secondary | ICD-10-CM | POA: Diagnosis not present

## 2020-06-15 NOTE — Therapy (Signed)
[facility] [place], [place], [address] Phone: [phone]   Fax:  [phone]  Physical Therapy Treatment  Patient Details  Name: Kelly Lewis MRN: [id number] Date of Birth: 01/14/1951 Referring Provider (PT): Levy Pupa, PA-C   Encounter Date: 06/15/2020   PT End of Session - 06/15/20 0811    Visit Number 5    Number of Visits 12    Date for PT Re-Evaluation 07/19/20    Authorization Type [employer] 05/31/2020-07/21/2020    PT Start Time 0732    PT Stop Time 0817    PT Time Calculation (min) 45 min    Activity Tolerance Patient tolerated treatment well    Behavior During Therapy [facility] for tasks assessed/performed           Past Medical History:  Diagnosis Date   Chronic anxiety    GERD (gastroesophageal reflux disease)    H/O degenerative disc disease    Hypertension    Plantar fasciitis    Thyroid disease     Past Surgical History:  Procedure Laterality Date   bladder tacked      There were no vitals filed for this visit.   Subjective Assessment - 06/15/20 0809    Subjective COVID-19 screening performed upon arrival. Patient reported going to the theater and her neck tightened up L > R.    Pertinent History HTN, osteopenia    Limitations House hold activities;Lifting    Diagnostic tests x-ray: arthritis, bone spurs, possible bulging disc in cevical spine per patient report    Patient Stated Goals decrease pain and improve sleep    Currently in Pain? Yes    Pain Score --   did not provide number on pain scale             [facility] PT Assessment - 06/15/20 0001      Assessment   Medical Diagnosis Cervical radiculopathy    Referring Provider (PT) Levy Pupa, PA-C    Hand Dominance Left    Next MD Visit 07/07/2020    Prior Therapy no      Precautions   Precautions None                         [facility] Adult PT Treatment/Exercise - 06/15/20 0001      Exercises    Exercises Neck      Neck Exercises: Machines for Strengthening   UBE (Upper Arm Bike) 120 RPM x8 mins (4 fwd, 4 bwd)      Neck Exercises: Theraband   Shoulder Extension 20 reps;Other (comment)    Shoulder Extension Limitations Blue XTS    Rows 20 reps    Rows Limitations Green XTS    Other Theraband Exercises bilateral protraction with blue XTS 2x10    Other Theraband Exercises Chop/lift x20 blue XTS      Modalities   Modalities Ultrasound;Teacher, English as a foreign language Location lower cervical and UTs    Electrical Stimulation Action IFC    Electrical Stimulation Parameters 80-150 hz x10 mins    Electrical Stimulation Goals Pain;Tone      Ultrasound   Ultrasound Location bilateral UTs    Ultrasound Parameters comboe e-stim/US 100% 1.5 w/cm2 1 mhz x10 mins    Ultrasound Goals Pain                    PT Short Term Goals - 05/31/20 1741  PT SHORT TERM GOAL #1   Title STG=LTG             PT Long Term Goals - 05/31/20 1741      PT LONG TERM GOAL #1   Title Patient will be independent with HEP.    Time 6    Period Weeks    Status New      PT LONG TERM GOAL #2   Title Patient will demonstrate 65+ degrees of cervical rotation AROM to improve scanning environment and looking over shoulder while driving.    Time 6    Period Weeks    Status New      PT LONG TERM GOAL #3   Title Patient will demonstrate 4+/5 bilateral UE MMT in all planes to improve stability during functional tasks.    Time 6    Period Weeks    Status New      PT LONG TERM GOAL #4   Title Patient will report ability to perform ADLs and home tasks with cervical pain less than or equal to 2/10.    Time 6    Period Weeks    Status New                 Plan - 06/15/20 3151    Clinical Impression Statement Patient responded well to therapy session with improved form with TEs. Patient noted with slight increase of tightness in UTs with  TEs but reduced after combo US/E-Stim. No adverse effects    Personal Factors and Comorbidities Age;Comorbidity 2    Comorbidities HTN, osteopenia    Stability/Clinical Decision Making Stable/Uncomplicated    Clinical Decision Making Low    Rehab Potential Good    PT Frequency 2x / week    PT Duration 6 weeks    PT Treatment/Interventions ADLs/Self Care Home Management;Moist Heat;Ultrasound;Electrical Stimulation;Cryotherapy;Manual techniques;Neuromuscular re-education;Therapeutic exercise;Therapeutic activities;Patient/family education;Passive range of motion;Dry needling    PT Next Visit Plan UBE, postural exercises, cervical stretching and ROM, manual traction, avoid mechanical due to osteopenia. Modalities PRN for pain relief    PT Home Exercise Plan see patient education section    Consulted and Agree with Plan of Care Patient           Patient will benefit from skilled therapeutic intervention in order to improve the following deficits and impairments:  Decreased activity tolerance, Decreased mobility, Pain, Postural dysfunction, Decreased range of motion  Visit Diagnosis: Radiculopathy, cervical region  Cervicalgia  Abnormal posture  Muscle weakness (generalized)     Problem List Patient Active Problem List   Diagnosis Date Noted   BMI 34.0-34.9,adult 03/28/2015   GAD (generalized anxiety disorder) 01/07/2013   Hypertension 01/07/2013   GERD (gastroesophageal reflux disease) 01/07/2013    Gabriela Eves, PT, DPT 06/15/2020, 8:33 AM  [facility] [place], [place], [address] Phone: [phone]   Fax:  [phone]  Name: Kelly Lewis MRN: [id number] Date of Birth: Jan 19, 1951

## 2020-06-20 ENCOUNTER — Encounter: Payer: Self-pay | Admitting: Physical Therapy

## 2020-06-20 ENCOUNTER — Ambulatory Visit: Payer: Medicare PPO | Admitting: Physical Therapy

## 2020-06-20 ENCOUNTER — Other Ambulatory Visit: Payer: Self-pay

## 2020-06-20 DIAGNOSIS — M6281 Muscle weakness (generalized): Secondary | ICD-10-CM

## 2020-06-20 DIAGNOSIS — R293 Abnormal posture: Secondary | ICD-10-CM | POA: Diagnosis not present

## 2020-06-20 DIAGNOSIS — M542 Cervicalgia: Secondary | ICD-10-CM

## 2020-06-20 DIAGNOSIS — M5412 Radiculopathy, cervical region: Secondary | ICD-10-CM

## 2020-06-20 NOTE — Therapy (Signed)
[facility] [address] [place], [place], [address] Phone: [phone]   Fax:  [phone]  Physical Therapy Treatment  Patient Details  Name: Kelly Lewis MRN: [id number] Date of Birth: Mar 10, 1951 Referring Provider (PT): Su Hoff, PA-C   Encounter Date: 06/20/2020   PT End of Session - 06/20/20 0743    Visit Number 6    Number of Visits 12    Date for PT Re-Evaluation 07/19/20    Authorization Type [employer] 05/31/2020-07/21/2020    PT Start Time 0732    PT Stop Time 0819    PT Time Calculation (min) 47 min    Activity Tolerance Patient tolerated treatment well    Behavior During Therapy [facility] for tasks assessed/performed           Past Medical History:  Diagnosis Date   Chronic anxiety    GERD (gastroesophageal reflux disease)    H/O degenerative disc disease    Hypertension    Plantar fasciitis    Thyroid disease     Past Surgical History:  Procedure Laterality Date   bladder tacked      There were no vitals filed for this visit.   Subjective Assessment - 06/20/20 0742    Subjective COVID-19 screening performed upon arrival. Patient reported ongoing stiffness but not pain like the start of therapy.    Pertinent History HTN, osteopenia    Limitations House hold activities;Lifting    Diagnostic tests x-ray: arthritis, bone spurs, possible bulging disc in cevical spine per patient report    Patient Stated Goals decrease pain and improve sleep    Currently in Pain? No/denies              [facility] PT Assessment - 06/20/20 0001      Assessment   Medical Diagnosis Cervical radiculopathy    Referring Provider (PT) Su Hoff, PA-C    Hand Dominance Left    Next MD Visit 07/07/2020    Prior Therapy no      AROM   Cervical Flexion 50    Cervical Extension 54    Cervical - Right Rotation 65    Cervical - Left Rotation 64                         [facility] Adult PT  Treatment/Exercise - 06/20/20 0001      Exercises   Exercises Neck      Neck Exercises: Machines for Strengthening   UBE (Upper Arm Bike) 90 RPM x8 mins (4 fwd, 4 bwd)      Neck Exercises: Standing   Other Standing Exercises lat pull downs blue XTS x20; high rows blue XTS x20      Modalities   Modalities Ultrasound;Geologist, engineering Location lower cervical and UTs    Electrical Stimulation Action IFC    Electrical Stimulation Parameters 80-150 hz x10 min    Electrical Stimulation Goals Pain;Tone      Ultrasound   Ultrasound Location bilateral UTs    Ultrasound Parameters combo e-stim/US 1.5 w/cm2 100% x12 mins    Ultrasound Goals Pain                    PT Short Term Goals - 05/31/20 1741      PT SHORT TERM GOAL #1   Title STG=LTG             PT Long  Term Goals - 06/20/20 0744      PT LONG TERM GOAL #1   Title Patient will be independent with HEP.    Time 6    Period Weeks      PT LONG TERM GOAL #2   Title Patient will demonstrate 65+ degrees of cervical rotation AROM to improve scanning environment and looking over shoulder while driving.    Time 6    Period Weeks    Status On-going      PT LONG TERM GOAL #3   Title Patient will demonstrate 4+/5 bilateral UE MMT in all planes to improve stability during functional tasks.    Time 6    Period Weeks    Status On-going      PT LONG TERM GOAL #4   Title Patient will report ability to perform ADLs and home tasks with cervical pain less than or equal to 2/10.    Time 6    Period Weeks    Status Achieved                 Plan - 06/20/20 0813    Clinical Impression Statement Patient responded well to the progression of TEs with no reports of increased pain. Patient provided with demonstration for new TEs to which she demonstrated fairly well. Patient has made improvements with cervical AROM; see measurements. LTG #1 and #4 have been  met; others are ongoing at this time. Normal response to modalities upon removal.    Personal Factors and Comorbidities Age;Comorbidity 2    Comorbidities HTN, osteopenia    Stability/Clinical Decision Making Stable/Uncomplicated    Clinical Decision Making Low    Rehab Potential Good    PT Frequency 2x / week    PT Duration 6 weeks    PT Treatment/Interventions ADLs/Self Care Home Management;Moist Heat;Ultrasound;Electrical Stimulation;Cryotherapy;Manual techniques;Neuromuscular re-education;Therapeutic exercise;Therapeutic activities;Patient/family education;Passive range of motion;Dry needling    PT Next Visit Plan UBE, postural exercises, cervical stretching and ROM, manual traction, avoid mechanical due to osteopenia. Modalities PRN for pain relief    PT Home Exercise Plan see patient education section    Consulted and Agree with Plan of Care Patient           Patient will benefit from skilled therapeutic intervention in order to improve the following deficits and impairments:  Decreased activity tolerance, Decreased mobility, Pain, Postural dysfunction, Decreased range of motion  Visit Diagnosis: Radiculopathy, cervical region  Cervicalgia  Abnormal posture  Muscle weakness (generalized)     Problem List Patient Active Problem List   Diagnosis Date Noted   BMI 34.0-34.9,adult 03/28/2015   GAD (generalized anxiety disorder) 01/07/2013   Hypertension 01/07/2013   GERD (gastroesophageal reflux disease) 01/07/2013    Omarius Grantham, .PT, DPT 06/20/2020, 8:25 AM  [facility] [place], [place], [address] Phone: [phone]   Fax:  [phone]  Name: Kelly Lewis MRN: [id number] Date of Birth: 10/08/1950

## 2020-06-22 ENCOUNTER — Ambulatory Visit: Payer: Medicare PPO | Admitting: Physical Therapy

## 2020-06-22 ENCOUNTER — Other Ambulatory Visit: Payer: Self-pay

## 2020-06-22 ENCOUNTER — Encounter: Payer: Self-pay | Admitting: Physical Therapy

## 2020-06-22 DIAGNOSIS — R293 Abnormal posture: Secondary | ICD-10-CM

## 2020-06-22 DIAGNOSIS — M542 Cervicalgia: Secondary | ICD-10-CM

## 2020-06-22 DIAGNOSIS — M6281 Muscle weakness (generalized): Secondary | ICD-10-CM | POA: Diagnosis not present

## 2020-06-22 DIAGNOSIS — M5412 Radiculopathy, cervical region: Secondary | ICD-10-CM

## 2020-06-22 NOTE — Therapy (Signed)
[facility] [place], [place], [address] Phone: [phone]   Fax:  [phone]  Physical Therapy Treatment  Patient Details  Name: Kelly Lewis MRN: [id number] Date of Birth: [birth year] Referring Provider (PT): Levy Pupa, PA-C   Encounter Date: 06/22/2020   PT End of Session - 06/22/20 0809    Visit Number 7    Number of Visits 12    Date for PT Re-Evaluation 07/19/20    Authorization Type [employer] 05/31/2020-07/21/2020    PT Start Time 0733    PT Stop Time 0818    PT Time Calculation (min) 45 min    Activity Tolerance Patient tolerated treatment well    Behavior During Therapy [facility] for tasks assessed/performed           Past Medical History:  Diagnosis Date  . Chronic anxiety   . GERD (gastroesophageal reflux disease)   . H/O degenerative disc disease   . Hypertension   . Plantar fasciitis   . Thyroid disease     Past Surgical History:  Procedure Laterality Date  . bladder tacked      There were no vitals filed for this visit.   Subjective Assessment - 06/22/20 0743    Subjective COVID-19 screening performed upon arrival. Patient arrives with low levels of soreness to lower cervical spine.    Pertinent History HTN, osteopenia    Limitations House hold activities;Lifting    Diagnostic tests x-ray: arthritis, bone spurs, possible bulging disc in cevical spine per patient report    Patient Stated Goals decrease pain and improve sleep    Currently in Pain? No/denies    Pain Location Neck    Pain Orientation Lower    Pain Descriptors / Indicators Aching;Sore;Tightness    Pain Type Acute pain    Pain Onset 1 to 4 weeks ago    Pain Frequency Intermittent              [facility] PT Assessment - 06/22/20 0001      Assessment   Medical Diagnosis Cervical radiculopathy    Referring Provider (PT) Levy Pupa, PA-C    Hand Dominance Left    Next MD Visit 07/07/2020    Prior Therapy no                          [facility] Adult PT Treatment/Exercise - 06/22/20 0001      Exercises   Exercises Neck      Neck Exercises: Machines for Strengthening   UBE (Upper Arm Bike) 90 RPM x8 mins (4 fwd, 4 bwd)      Neck Exercises: Theraband   Shoulder Extension 20 reps;Other (comment)    Shoulder Extension Limitations Blue XTS    Rows 20 reps    Rows Limitations Blue XTS    Other Theraband Exercises Chop/lift x20 blue XTS      Modalities   Modalities Ultrasound;Teacher, English as a foreign language Location lower cervical and UTs    Electrical Stimulation Action IFC    Electrical Stimulation Parameters 80-150 hz x10 mins    Electrical Stimulation Goals Pain;Tone      Ultrasound   Ultrasound Location bilateral UTs    Ultrasound Parameters combo e-stim/US 100% 1 mhz 1.5 w/cm2 x12    Ultrasound Goals Pain                    PT Short Term Goals -  05/31/20 1741      PT SHORT TERM GOAL #1   Title STG=LTG             PT Long Term Goals - 06/20/20 0744      PT LONG TERM GOAL #1   Title Patient will be independent with HEP.    Time 6    Period Weeks      PT LONG TERM GOAL #2   Title Patient will demonstrate 65+ degrees of cervical rotation AROM to improve scanning environment and looking over shoulder while driving.    Time 6    Period Weeks    Status On-going      PT LONG TERM GOAL #3   Title Patient will demonstrate 4+/5 bilateral UE MMT in all planes to improve stability during functional tasks.    Time 6    Period Weeks    Status On-going      PT LONG TERM GOAL #4   Title Patient will report ability to perform ADLs and home tasks with cervical pain less than or equal to 2/10.    Time 6    Period Weeks    Status Achieved                 Plan - 06/22/20 0809    Clinical Impression Statement Patient responded well to the addition of sets to XTS TEs. Patient demonstrated good form with all TEs  demonstrating strong carryover of cues. Patient responded well to Combo e-stim/US. No adverse affects upon removal of modalities.    Personal Factors and Comorbidities Age;Comorbidity 2    Comorbidities HTN, osteopenia    Stability/Clinical Decision Making Stable/Uncomplicated    Clinical Decision Making Low    Rehab Potential Good    PT Frequency 2x / week    PT Duration 6 weeks    PT Treatment/Interventions ADLs/Self Care Home Management;Moist Heat;Ultrasound;Electrical Stimulation;Cryotherapy;Manual techniques;Neuromuscular re-education;Therapeutic exercise;Therapeutic activities;Patient/family education;Passive range of motion;Dry needling    PT Next Visit Plan UBE, postural exercises, cervical stretching and ROM, manual traction, avoid mechanical due to osteopenia. Modalities PRN for pain relief    PT Home Exercise Plan see patient education section    Consulted and Agree with Plan of Care Patient           Patient will benefit from skilled therapeutic intervention in order to improve the following deficits and impairments:  Decreased activity tolerance, Decreased mobility, Pain, Postural dysfunction, Decreased range of motion  Visit Diagnosis: Radiculopathy, cervical region  Cervicalgia  Abnormal posture  Muscle weakness (generalized)     Problem List Patient Active Problem List   Diagnosis Date Noted  . BMI 34.0-34.9,adult 03/28/2015  . GAD (generalized anxiety disorder) 01/07/2013  . Hypertension 01/07/2013  . GERD (gastroesophageal reflux disease) 01/07/2013    Gabriela Eves, PT, DPT 06/22/2020, 8:33 AM  [facility] [place], [place], [address] Phone: [phone]   Fax:  [phone]  Name: Kelly Lewis MRN: [id number] Date of Birth: Jan 19, 1951

## 2020-06-26 ENCOUNTER — Ambulatory Visit: Payer: Medicare PPO | Admitting: Physical Therapy

## 2020-06-26 ENCOUNTER — Other Ambulatory Visit: Payer: Self-pay

## 2020-06-26 DIAGNOSIS — M6281 Muscle weakness (generalized): Secondary | ICD-10-CM | POA: Diagnosis not present

## 2020-06-26 DIAGNOSIS — M542 Cervicalgia: Secondary | ICD-10-CM | POA: Diagnosis not present

## 2020-06-26 DIAGNOSIS — R293 Abnormal posture: Secondary | ICD-10-CM

## 2020-06-26 DIAGNOSIS — M5412 Radiculopathy, cervical region: Secondary | ICD-10-CM | POA: Diagnosis not present

## 2020-06-26 NOTE — Therapy (Signed)
[facility] [place], [place], [address] Phone: [phone]   Fax:  [phone]  Physical Therapy Treatment  Patient Details  Name: Kelly Lewis MRN: [id number] Date of Birth: [birth year] Referring Provider (PT): Levy Pupa, PA-C   Encounter Date: 06/26/2020   PT End of Session - 06/26/20 1039    Visit Number 8    Number of Visits 12    Date for PT Re-Evaluation 07/19/20    Authorization Type [employer] 05/31/2020-07/21/2020    PT Start Time 0945    PT Stop Time 1031    PT Time Calculation (min) 46 min    Activity Tolerance Patient tolerated treatment well    Behavior During Therapy [facility] for tasks assessed/performed           Past Medical History:  Diagnosis Date  . Chronic anxiety   . GERD (gastroesophageal reflux disease)   . H/O degenerative disc disease   . Hypertension   . Plantar fasciitis   . Thyroid disease     Past Surgical History:  Procedure Laterality Date  . bladder tacked      There were no vitals filed for this visit.   Subjective Assessment - 06/26/20 0954    Subjective COVID-19 screening performed upon arrival. Patient arrives with some increased tightness in UTs. States may have been caused due to stress of finding a blood spot on eye.    Pertinent History HTN, osteopenia    Limitations House hold activities;Lifting    Diagnostic tests x-ray: arthritis, bone spurs, possible bulging disc in cevical spine per patient report    Patient Stated Goals decrease pain and improve sleep    Currently in Pain? No/denies              [facility] PT Assessment - 06/26/20 0001      Assessment   Medical Diagnosis Cervical radiculopathy    Referring Provider (PT) Levy Pupa, PA-C    Hand Dominance Left    Next MD Visit 07/07/2020    Prior Therapy no                         [facility] Adult PT Treatment/Exercise - 06/26/20 0001      Exercises   Exercises Neck      Neck  Exercises: Machines for Strengthening   UBE (Upper Arm Bike) 90 RPM x8 mins (4 fwd, 4 bwd)      Neck Exercises: Standing   Other Standing Exercises shoulder rolls forward and backward x20 each    Other Standing Exercises X to V in standing with pillow for support x20      Modalities   Modalities Ultrasound;Teacher, English as a foreign language Location lower cervical and UTs    Electrical Stimulation Action IFC    Electrical Stimulation Parameters 80-150 hz x10 mins    Electrical Stimulation Goals Pain;Tone      Ultrasound   Ultrasound Location bilateral UTs    Ultrasound Parameters combo e-stim/US 100% 1.5 w/cm2     Ultrasound Goals Pain      Neck Exercises: Stretches   Corner Stretch 3 reps;30 seconds    Lower Cervical/Upper Thoracic Stretch 3 reps;30 seconds                    PT Short Term Goals - 05/31/20 1741      PT SHORT TERM GOAL #1   Title STG=LTG  PT Long Term Goals - 06/20/20 0744      PT LONG TERM GOAL #1   Title Patient will be independent with HEP.    Time 6    Period Weeks      PT LONG TERM GOAL #2   Title Patient will demonstrate 65+ degrees of cervical rotation AROM to improve scanning environment and looking over shoulder while driving.    Time 6    Period Weeks    Status On-going      PT LONG TERM GOAL #3   Title Patient will demonstrate 4+/5 bilateral UE MMT in all planes to improve stability during functional tasks.    Time 6    Period Weeks    Status On-going      PT LONG TERM GOAL #4   Title Patient will report ability to perform ADLs and home tasks with cervical pain less than or equal to 2/10.    Time 6    Period Weeks    Status Achieved                 Plan - 06/26/20 1039    Clinical Impression Statement Patinet responded well to therapy session with no reports of increased pain. Patient guided through stretching to which she responded well with a decrease in  tightness. Normal response to modalities upon removal.    Personal Factors and Comorbidities Age;Comorbidity 2    Comorbidities HTN, osteopenia    Stability/Clinical Decision Making Stable/Uncomplicated    Clinical Decision Making Low    Rehab Potential Good    PT Frequency 2x / week    PT Duration 6 weeks    PT Treatment/Interventions ADLs/Self Care Home Management;Moist Heat;Ultrasound;Electrical Stimulation;Cryotherapy;Manual techniques;Neuromuscular re-education;Therapeutic exercise;Therapeutic activities;Patient/family education;Passive range of motion;Dry needling    PT Next Visit Plan UBE, postural exercises, cervical stretching and ROM, manual traction, avoid mechanical due to osteopenia. Modalities PRN for pain relief    PT Home Exercise Plan see patient education section    Consulted and Agree with Plan of Care Patient           Patient will benefit from skilled therapeutic intervention in order to improve the following deficits and impairments:  Decreased activity tolerance, Decreased mobility, Pain, Postural dysfunction, Decreased range of motion  Visit Diagnosis: Radiculopathy, cervical region  Cervicalgia  Abnormal posture  Muscle weakness (generalized)     Problem List Patient Active Problem List   Diagnosis Date Noted  . BMI 34.0-34.9,adult 03/28/2015  . GAD (generalized anxiety disorder) 01/07/2013  . Hypertension 01/07/2013  . GERD (gastroesophageal reflux disease) 01/07/2013    Gabriela Eves, PT, DPT 06/26/2020, 10:50 AM  [facility] [place], [place], [address] Phone: [phone]   Fax:  [phone]  Name: Kelly Lewis MRN: [id number] Date of Birth: 28-Mar-1951

## 2020-06-29 ENCOUNTER — Other Ambulatory Visit: Payer: Self-pay

## 2020-06-29 ENCOUNTER — Encounter: Payer: Self-pay | Admitting: Physical Therapy

## 2020-06-29 ENCOUNTER — Ambulatory Visit: Payer: Medicare PPO | Admitting: Physical Therapy

## 2020-06-29 DIAGNOSIS — M6281 Muscle weakness (generalized): Secondary | ICD-10-CM

## 2020-06-29 DIAGNOSIS — M5412 Radiculopathy, cervical region: Secondary | ICD-10-CM | POA: Diagnosis not present

## 2020-06-29 DIAGNOSIS — M542 Cervicalgia: Secondary | ICD-10-CM | POA: Diagnosis not present

## 2020-06-29 DIAGNOSIS — R293 Abnormal posture: Secondary | ICD-10-CM

## 2020-06-29 NOTE — Therapy (Signed)
[facility] [place], [place], [address] Phone: [phone]   Fax:  [phone]  Physical Therapy Treatment  Patient Details  Name: Kelly Lewis MRN: [id number] Date of Birth: 1951-07-21 Referring Provider (PT): Levy Pupa, PA-C   Encounter Date: 06/29/2020   PT End of Session - 06/29/20 0742    Visit Number 9    Number of Visits 12    Date for PT Re-Evaluation 07/19/20    Authorization Type [employer] 05/31/2020-07/21/2020    PT Start Time 0731    PT Stop Time 0820    PT Time Calculation (min) 49 min    Activity Tolerance Patient tolerated treatment well    Behavior During Therapy [facility] for tasks assessed/performed           Past Medical History:  Diagnosis Date  . Chronic anxiety   . GERD (gastroesophageal reflux disease)   . H/O degenerative disc disease   . Hypertension   . Plantar fasciitis   . Thyroid disease     Past Surgical History:  Procedure Laterality Date  . bladder tacked      There were no vitals filed for this visit.   Subjective Assessment - 06/29/20 0741    Subjective COVID-19 screening performed upon arrival. Patient reports no new complaints.    Pertinent History HTN, osteopenia    Limitations House hold activities;Lifting    Diagnostic tests x-ray: arthritis, bone spurs, possible bulging disc in cevical spine per patient report    Patient Stated Goals decrease pain and improve sleep    Currently in Pain? No/denies              [facility] PT Assessment - 06/29/20 0001      Assessment   Medical Diagnosis Cervical radiculopathy    Referring Provider (PT) Levy Pupa, PA-C    Next MD Visit 07/07/2020    Prior Therapy no                         [facility] Adult PT Treatment/Exercise - 06/29/20 0001      Exercises   Exercises Neck      Neck Exercises: Machines for Strengthening   UBE (Upper Arm Bike) 90 RPM x8 mins (4 fwd, 4 bwd)      Neck Exercises: Standing    Upper Extremity D2 Flexion;20 reps;Theraband    Theraband Level (UE D2) Level 1 (Yellow)    Other Standing Exercises horizontal abduction yellow theraband x20    Other Standing Exercises cervical rotation and flexion with chin tuck against ball. x20 each      Modalities   Modalities Ultrasound;Energy manager lower cervical    Electrical Stimulation Action pre-mod    Electrical Stimulation Parameters 80-150 hz x10 mins    Electrical Stimulation Goals Pain;Tone      Ultrasound   Ultrasound Location bilateral UTs    Ultrasound Parameters combo e-stim/US x100% 1.5    Ultrasound Goals Pain;Other (Comment)   tone                   PT Short Term Goals - 05/31/20 1741      PT SHORT TERM GOAL #1   Title STG=LTG             PT Long Term Goals - 06/20/20 0744      PT LONG TERM GOAL #1   Title Patient will  be independent with HEP.    Time 6    Period Weeks      PT LONG TERM GOAL #2   Title Patient will demonstrate 65+ degrees of cervical rotation AROM to improve scanning environment and looking over shoulder while driving.    Time 6    Period Weeks    Status On-going      PT LONG TERM GOAL #3   Title Patient will demonstrate 4+/5 bilateral UE MMT in all planes to improve stability during functional tasks.    Time 6    Period Weeks    Status On-going      PT LONG TERM GOAL #4   Title Patient will report ability to perform ADLs and home tasks with cervical pain less than or equal to 2/10.    Time 6    Period Weeks    Status Achieved                 Plan - 06/29/20 0747    Clinical Impression Statement Patient responded well to the progression of standing TEs with theraband. Patient provided with intermittent verbal cuing for proper alignment with overall improvement in form. Patient educated on use of home TENs unit with reports of understanding. Patient and PT discussed decreasing  frequency to 1x/week for remaining 3 visits with emphasis on HEP. Normal response to combo and e-stim upon removal.    Personal Factors and Comorbidities Age;Comorbidity 2    Comorbidities HTN, osteopenia    Stability/Clinical Decision Making Stable/Uncomplicated    Clinical Decision Making Low    Rehab Potential Good    PT Frequency 2x / week    PT Duration 6 weeks    PT Treatment/Interventions ADLs/Self Care Home Management;Moist Heat;Ultrasound;Electrical Stimulation;Cryotherapy;Manual techniques;Neuromuscular re-education;Therapeutic exercise;Therapeutic activities;Patient/family education;Passive range of motion;Dry needling    PT Next Visit Plan Progress note. UBE, postural exercises, cervical stretching and ROM, manual traction, avoid mechanical due to osteopenia. Modalities PRN for pain relief    PT Home Exercise Plan see patient education section    Consulted and Agree with Plan of Care Patient           Patient will benefit from skilled therapeutic intervention in order to improve the following deficits and impairments:  Decreased activity tolerance, Decreased mobility, Pain, Postural dysfunction, Decreased range of motion  Visit Diagnosis: Radiculopathy, cervical region  Cervicalgia  Abnormal posture  Muscle weakness (generalized)     Problem List Patient Active Problem List   Diagnosis Date Noted  . BMI 34.0-34.9,adult 03/28/2015  . GAD (generalized anxiety disorder) 01/07/2013  . Hypertension 01/07/2013  . GERD (gastroesophageal reflux disease) 01/07/2013    Gabriela Eves, PT, DPT 06/29/2020, 8:32 AM  [id number] [address] [place], [place], [address] Phone: [phone]   Fax:  [phone]  Name: Kelly Lewis MRN: [id number] Date of Birth: [birth year]

## 2020-07-05 ENCOUNTER — Ambulatory Visit: Payer: Medicare PPO | Admitting: Physical Therapy

## 2020-07-05 ENCOUNTER — Other Ambulatory Visit: Payer: Self-pay

## 2020-07-05 ENCOUNTER — Ambulatory Visit: Payer: Self-pay

## 2020-07-07 DIAGNOSIS — M5136 Other intervertebral disc degeneration, lumbar region: Secondary | ICD-10-CM | POA: Diagnosis not present

## 2020-07-07 DIAGNOSIS — M5030 Other cervical disc degeneration, unspecified cervical region: Secondary | ICD-10-CM | POA: Diagnosis not present

## 2020-07-12 ENCOUNTER — Other Ambulatory Visit: Payer: Self-pay

## 2020-07-12 ENCOUNTER — Ambulatory Visit: Payer: Medicare PPO | Attending: Chiropractic Medicine | Admitting: Physical Therapy

## 2020-07-12 DIAGNOSIS — M6281 Muscle weakness (generalized): Secondary | ICD-10-CM | POA: Diagnosis not present

## 2020-07-12 DIAGNOSIS — M5412 Radiculopathy, cervical region: Secondary | ICD-10-CM

## 2020-07-12 DIAGNOSIS — R293 Abnormal posture: Secondary | ICD-10-CM | POA: Diagnosis not present

## 2020-07-12 DIAGNOSIS — M542 Cervicalgia: Secondary | ICD-10-CM

## 2020-07-12 NOTE — Therapy (Signed)
[facility] [place], [place], [address] Phone: [phone]   Fax:  [phone]  Physical Therapy Treatment Progress Note Reporting Period 05/31/2020 to 07/12/2020  See note below for Objective Data and Assessment of Progress/Goals. All goals have been met; DC next visit.      Patient Details  Name: Kelly Lewis MRN: [id number] Date of Birth: 01/14/1951 Referring Provider (PT): Levy Pupa, PA-C   Encounter Date: 07/12/2020   PT End of Session - 07/12/20 0745    Visit Number 10    Number of Visits 12    Date for PT Re-Evaluation 07/19/20    Authorization Type [employer] 05/31/2020-07/21/2020    PT Start Time 0733    Activity Tolerance Patient tolerated treatment well    Behavior During Therapy [facility] for tasks assessed/performed           Past Medical History:  Diagnosis Date  . Chronic anxiety   . GERD (gastroesophageal reflux disease)   . H/O degenerative disc disease   . Hypertension   . Plantar fasciitis   . Thyroid disease     Past Surgical History:  Procedure Laterality Date  . bladder tacked      There were no vitals filed for this visit.   Subjective Assessment - 07/12/20 0837    Subjective COVID-19 screening performed upon arrival. Patient reports doing well. No reports of pain.    Pertinent History HTN, osteopenia    Limitations House hold activities;Lifting    Diagnostic tests x-ray: arthritis, bone spurs, possible bulging disc in cevical spine per patient report    Patient Stated Goals decrease pain and improve sleep    Currently in Pain? No/denies              [facility] PT Assessment - 07/12/20 0001      Assessment   Medical Diagnosis Cervical radiculopathy    Referring Provider (PT) Levy Pupa, PA-C    Next MD Visit 07/07/2020    Prior Therapy no      AROM   Cervical - Right Rotation 74    Cervical - Left Rotation 68      Strength   Right Shoulder Flexion 4/5    Right  Shoulder ABduction 4/5    Right Shoulder Internal Rotation 4+/5    Right Shoulder External Rotation 4+/5    Left Shoulder Flexion 4/5    Left Shoulder ABduction 4/5    Left Shoulder Internal Rotation 4+/5    Left Shoulder External Rotation 4+/5                         [facility] Adult PT Treatment/Exercise - 07/12/20 0001      Exercises   Exercises Neck      Neck Exercises: Machines for Strengthening   UBE (Upper Arm Bike) 90 RPM x10 mins (5 fwd, 5 bwd)      Neck Exercises: Standing   Other Standing Exercises shoulder extension followed by chop/lift diagonals x20 each Ble XTS      Modalities   Modalities Ultrasound;Associate Professor IFC    Electrical Stimulation Parameters 80-150 HZ X10 MINS    Electrical Stimulation Goals Pain;Tone      Ultrasound   Ultrasound Location bilateral UTs    Ultrasound Parameters combo e-stim 100% 1 mhz 1.5 w/cm2 x10 mins    Ultrasound Goals  Pain;Other (Comment)   tone                   PT Short Term Goals - 05/31/20 1741      PT SHORT TERM GOAL #1   Title STG=LTG             PT Long Term Goals - 07/12/20 0746      PT LONG TERM GOAL #1   Title Patient will be independent with HEP.    Time 6    Period Weeks    Status Achieved      PT LONG TERM GOAL #2   Title Patient will demonstrate 65+ degrees of cervical rotation AROM to improve scanning environment and looking over shoulder while driving.    Time 6    Period Weeks    Status Achieved      PT LONG TERM GOAL #3   Title Patient will demonstrate 4+/5 bilateral UE MMT in all planes to improve stability during functional tasks.    Time 6    Period Weeks    Status Achieved      PT LONG TERM GOAL #4   Title Patient will report ability to perform ADLs and home tasks with cervical pain less than or equal to 2/10.    Time 6    Period Weeks     Status Achieved                 Plan - 07/12/20 0835    Clinical Impression Statement Patient responded well to therapy session with no reports of increased pain. Patient was able to complete TEs with good form and technique. Patient's goals have all been met. Anticipate DC next visit with HEP. Patient reported agreement.    Personal Factors and Comorbidities Age;Comorbidity 2    Comorbidities HTN, osteopenia    Stability/Clinical Decision Making Stable/Uncomplicated    Clinical Decision Making Low    Rehab Potential Good    PT Frequency 2x / week    PT Duration 6 weeks    PT Treatment/Interventions ADLs/Self Care Home Management;Moist Heat;Ultrasound;Electrical Stimulation;Cryotherapy;Manual techniques;Neuromuscular re-education;Therapeutic exercise;Therapeutic activities;Patient/family education;Passive range of motion;Dry needling    PT Next Visit Plan DC, advanced HEP    PT Home Exercise Plan see patient education section    Consulted and Agree with Plan of Care Patient           Patient will benefit from skilled therapeutic intervention in order to improve the following deficits and impairments:  Decreased activity tolerance, Decreased mobility, Pain, Postural dysfunction, Decreased range of motion  Visit Diagnosis: Radiculopathy, cervical region  Cervicalgia  Abnormal posture  Muscle weakness (generalized)     Problem List Patient Active Problem List   Diagnosis Date Noted  . BMI 34.0-34.9,adult 03/28/2015  . GAD (generalized anxiety disorder) 01/07/2013  . Hypertension 01/07/2013  . GERD (gastroesophageal reflux disease) 01/07/2013    Gabriela Eves, PT, DPT 07/12/2020, 8:38 AM  [facility] [place], [place], [address] Phone: [phone]   Fax:  [phone]  Name: Kelly Lewis MRN: [id number] Date of Birth: 07/12/1951

## 2020-07-19 ENCOUNTER — Other Ambulatory Visit: Payer: Self-pay

## 2020-07-19 ENCOUNTER — Ambulatory Visit (INDEPENDENT_AMBULATORY_CARE_PROVIDER_SITE_OTHER): Payer: Medicare PPO

## 2020-07-19 ENCOUNTER — Encounter: Payer: Self-pay | Admitting: Physical Therapy

## 2020-07-19 ENCOUNTER — Ambulatory Visit: Payer: Medicare PPO | Admitting: Physical Therapy

## 2020-07-19 DIAGNOSIS — M542 Cervicalgia: Secondary | ICD-10-CM

## 2020-07-19 DIAGNOSIS — M6281 Muscle weakness (generalized): Secondary | ICD-10-CM | POA: Diagnosis not present

## 2020-07-19 DIAGNOSIS — M5412 Radiculopathy, cervical region: Secondary | ICD-10-CM | POA: Diagnosis not present

## 2020-07-19 DIAGNOSIS — R293 Abnormal posture: Secondary | ICD-10-CM | POA: Diagnosis not present

## 2020-07-19 DIAGNOSIS — Z23 Encounter for immunization: Secondary | ICD-10-CM | POA: Diagnosis not present

## 2020-07-19 NOTE — Progress Notes (Signed)
   Covid-19 Vaccination Clinic  Name:  Kelly Lewis    MRN: [id number] DOB: [birth year]  07/19/2020  [name] was observed post Covid-19 immunization for 15 minutes without incident. She was provided with Vaccine Information Sheet and instruction to access the V-Safe system.   [name] was instructed to call 911 with any severe reactions post vaccine: Marland Kitchen Difficulty breathing  . Swelling of face and throat  . A fast heartbeat  . A bad rash all over body  . Dizziness and weakness   Immunizations Administered    Name Date Dose VIS Date Route   Pfizer COVID-19 Vaccine 07/19/2020 10:01 AM 0.3 mL 06/21/2020 Intramuscular   Manufacturer: [place]   Lot: [id number]   [place]: [id number]

## 2020-07-19 NOTE — Therapy (Signed)
[facility] [place], [place], [address] Phone: [phone]   Fax:  [phone]  Physical Therapy Treatment PHYSICAL THERAPY DISCHARGE SUMMARY  Visits from Start of Care: 11  Current functional level related to goals / functional outcomes: See below   Remaining deficits: See goals   Education / Equipment: HEP Plan: Patient agrees to discharge.  Patient goals were met. Patient is being discharged due to meeting the stated rehab goals.  ?????      Patient Details  Name: Kelly Lewis MRN: [id number] Date of Birth: Oct 28, 1950 Referring Provider (PT): Levy Pupa, PA-C   Encounter Date: 07/19/2020   PT End of Session - 07/19/20 0734    Visit Number 11    Number of Visits 12    Date for PT Re-Evaluation 07/19/20    Authorization Type [employer] 05/31/2020-07/21/2020    PT Start Time 0731    PT Stop Time 0817    PT Time Calculation (min) 46 min    Activity Tolerance Patient tolerated treatment well    Behavior During Therapy [facility] for tasks assessed/performed           Past Medical History:  Diagnosis Date  . Chronic anxiety   . GERD (gastroesophageal reflux disease)   . H/O degenerative disc disease   . Hypertension   . Plantar fasciitis   . Thyroid disease     Past Surgical History:  Procedure Laterality Date  . bladder tacked      There were no vitals filed for this visit.   Subjective Assessment - 07/19/20 0735    Subjective COVID-19 screening performed upon arrival. Patient reports doing well just tight across the UTs    Pertinent History HTN, osteopenia    Limitations House hold activities;Lifting    Diagnostic tests x-ray: arthritis, bone spurs, possible bulging disc in cevical spine per patient report    Patient Stated Goals decrease pain and improve sleep    Currently in Pain? No/denies              [facility] PT Assessment - 07/19/20 0001      Assessment   Medical Diagnosis  Cervical radiculopathy    Referring Provider (PT) Levy Pupa, PA-C    Next MD Visit 07/07/2020    Prior Therapy no                         [facility] Adult PT Treatment/Exercise - 07/19/20 0001      Exercises   Exercises Neck      Neck Exercises: Machines for Strengthening   UBE (Upper Arm Bike) 60 RPM x10 mins (5 fwd, 5 bwd)      Neck Exercises: Theraband   Horizontal ABduction 20 reps;Red      Neck Exercises: Standing   Other Standing Exercises rows, extension x20 blue XTS      Modalities   Modalities Ultrasound;Associate Professor [employer]    Electrical Stimulation Parameters 80-150 hz x10 mins    Electrical Stimulation Goals Pain;Tone      Ultrasound   Ultrasound Location bilateral UTs    Ultrasound Parameters combo e-stim/US 100% 1.5 w/cm2 1 mhz x10 mins    Ultrasound Goals Other (Comment)   tone                 PT Education - 07/19/20 7673  Education Details horizontal abduction, rows extension with red theraband    Person(s) Educated Patient    Methods Explanation;Demonstration;Handout    Comprehension Returned demonstration;Verbalized understanding            PT Short Term Goals - 05/31/20 1741      PT SHORT TERM GOAL #1   Title STG=LTG             PT Long Term Goals - 07/12/20 0746      PT LONG TERM GOAL #1   Title Patient will be independent with HEP.    Time 6    Period Weeks    Status Achieved      PT LONG TERM GOAL #2   Title Patient will demonstrate 65+ degrees of cervical rotation AROM to improve scanning environment and looking over shoulder while driving.    Time 6    Period Weeks    Status Achieved      PT LONG TERM GOAL #3   Title Patient will demonstrate 4+/5 bilateral UE MMT in all planes to improve stability during functional tasks.    Time 6    Period Weeks    Status Achieved      PT LONG  TERM GOAL #4   Title Patient will report ability to perform ADLs and home tasks with cervical pain less than or equal to 2/10.    Time 6    Period Weeks    Status Achieved                 Plan - 07/19/20 6546    Clinical Impression Statement Patient responded well to therapy session with no reports of increased pain. Patient guided through TEs and was provided with a print out for HEP. Patient's FOTO limitation 31%. All goals met. Patient educated to continue HEP of stretching and strengthening to maintain gains as well as utilize TENs unit PRN for pain relief. Patient reported understanding.    Personal Factors and Comorbidities Age;Comorbidity 2    Comorbidities HTN, osteopenia    Stability/Clinical Decision Making Stable/Uncomplicated    Rehab Potential Good    PT Frequency 2x / week    PT Duration 6 weeks    PT Treatment/Interventions ADLs/Self Care Home Management;Moist Heat;Ultrasound;Electrical Stimulation;Cryotherapy;Manual techniques;Neuromuscular re-education;Therapeutic exercise;Therapeutic activities;Patient/family education;Passive range of motion;Dry needling    PT Next Visit Plan DC, advanced HEP    PT Home Exercise Plan see patient education section    Consulted and Agree with Plan of Care Patient           Patient will benefit from skilled therapeutic intervention in order to improve the following deficits and impairments:  Decreased activity tolerance, Decreased mobility, Pain, Postural dysfunction, Decreased range of motion  Visit Diagnosis: Radiculopathy, cervical region  Cervicalgia  Muscle weakness (generalized)  Abnormal posture     Problem List Patient Active Problem List   Diagnosis Date Noted  . BMI 34.0-34.9,adult 03/28/2015  . GAD (generalized anxiety disorder) 01/07/2013  . Hypertension 01/07/2013  . GERD (gastroesophageal reflux disease) 01/07/2013    Gabriela Eves, PT, DPT 07/19/2020, 8:34 AM  [facility] [place], [place], [address] Phone: [phone]   Fax:  [phone]  Name: KASIDEE VOISIN MRN: [id number] Date of Birth: August 13, 1951

## 2020-09-02 DIAGNOSIS — D696 Thrombocytopenia, unspecified: Secondary | ICD-10-CM

## 2020-09-02 HISTORY — DX: Thrombocytopenia, unspecified: D69.6

## 2020-09-19 DIAGNOSIS — L0202 Furuncle of face: Secondary | ICD-10-CM | POA: Diagnosis not present

## 2020-09-19 DIAGNOSIS — D225 Melanocytic nevi of trunk: Secondary | ICD-10-CM | POA: Diagnosis not present

## 2020-09-19 DIAGNOSIS — L820 Inflamed seborrheic keratosis: Secondary | ICD-10-CM | POA: Diagnosis not present

## 2020-09-19 DIAGNOSIS — Z1282 Encounter for screening for malignant neoplasm of nervous system: Secondary | ICD-10-CM | POA: Diagnosis not present

## 2020-09-20 DIAGNOSIS — Z1231 Encounter for screening mammogram for malignant neoplasm of breast: Secondary | ICD-10-CM | POA: Diagnosis not present

## 2020-11-21 DIAGNOSIS — R339 Retention of urine, unspecified: Secondary | ICD-10-CM | POA: Diagnosis not present

## 2020-11-21 DIAGNOSIS — N8112 Cystocele, lateral: Secondary | ICD-10-CM | POA: Diagnosis not present

## 2020-11-21 DIAGNOSIS — R351 Nocturia: Secondary | ICD-10-CM | POA: Diagnosis not present

## 2020-11-21 DIAGNOSIS — R82998 Other abnormal findings in urine: Secondary | ICD-10-CM | POA: Diagnosis not present

## 2020-11-23 DIAGNOSIS — N398 Other specified disorders of urinary system: Secondary | ICD-10-CM | POA: Insufficient documentation

## 2020-11-26 ENCOUNTER — Other Ambulatory Visit: Payer: Self-pay | Admitting: Nurse Practitioner

## 2020-11-26 DIAGNOSIS — K219 Gastro-esophageal reflux disease without esophagitis: Secondary | ICD-10-CM

## 2020-11-26 DIAGNOSIS — I10 Essential (primary) hypertension: Secondary | ICD-10-CM

## 2020-12-04 ENCOUNTER — Ambulatory Visit: Payer: Medicare PPO | Admitting: Nurse Practitioner

## 2020-12-04 ENCOUNTER — Encounter: Payer: Self-pay | Admitting: Nurse Practitioner

## 2020-12-04 ENCOUNTER — Other Ambulatory Visit: Payer: Self-pay

## 2020-12-04 VITALS — BP 164/81 | HR 103 | Temp 97.5°F | Ht 66.0 in | Wt 208.6 lb

## 2020-12-04 DIAGNOSIS — Z6831 Body mass index (BMI) 31.0-31.9, adult: Secondary | ICD-10-CM

## 2020-12-04 DIAGNOSIS — I10 Essential (primary) hypertension: Secondary | ICD-10-CM

## 2020-12-04 DIAGNOSIS — F411 Generalized anxiety disorder: Secondary | ICD-10-CM | POA: Diagnosis not present

## 2020-12-04 DIAGNOSIS — E782 Mixed hyperlipidemia: Secondary | ICD-10-CM

## 2020-12-04 DIAGNOSIS — K219 Gastro-esophageal reflux disease without esophagitis: Secondary | ICD-10-CM | POA: Diagnosis not present

## 2020-12-04 DIAGNOSIS — Z6834 Body mass index (BMI) 34.0-34.9, adult: Secondary | ICD-10-CM

## 2020-12-04 DIAGNOSIS — E039 Hypothyroidism, unspecified: Secondary | ICD-10-CM

## 2020-12-04 MED ORDER — LISINOPRIL 10 MG PO TABS: 1.0000 | ORAL_TABLET | Freq: Every day | ORAL | 1 refills | Status: DC

## 2020-12-04 MED ORDER — LEVOTHYROXINE SODIUM 100 MCG PO TABS: 100.0000 ug | ORAL_TABLET | Freq: Every day | ORAL | 1 refills | Status: DC

## 2020-12-04 MED ORDER — ROSUVASTATIN CALCIUM 10 MG PO TABS
10.0000 mg | ORAL_TABLET | Freq: Every day | ORAL | 1 refills | Status: DC
Start: 2020-12-04 — End: 2021-06-06

## 2020-12-04 MED ORDER — LORAZEPAM 0.5 MG PO TABS: 0.5000 mg | ORAL_TABLET | Freq: Two times a day (BID) | ORAL | 5 refills | Status: DC | PRN

## 2020-12-04 MED ORDER — OMEPRAZOLE 20 MG PO CPDR: DELAYED_RELEASE_CAPSULE | ORAL | 1 refills | Status: DC

## 2020-12-04 NOTE — Progress Notes (Addendum)
Subjective:    Patient ID: Kelly Lewis, female    DOB: 1951/06/12, 70 y.o.   MRN: [id number]   Chief Complaint: Medical Management of Chronic Issues    HPI:  1. Primary hypertension Currently on lisinopril. No c/o cough, swelling. Tolerating well. Does monitor BP at home. BP's run in 120's-130's/60s-70s at home.   2. Gastroesophageal reflux disease without esophagitis Currently on omprerazole. Working well for her. Avoids triggers.   3. GAD (generalized anxiety disorder) Currently on PRN lorazepam for sx management. Taking it one a day most days, sometimes twice a day. She has had an increase in stress and she has found it harder to deal with than usual.   GAD 7 : Generalized Anxiety Score 12/04/2020 06/05/2020 11/23/2019  Nervous, Anxious, on Edge 1 0 0  Control/stop worrying 1 0 0  Worry too much - different things 1 0 0  Trouble relaxing 1 0 0  Restless 0 0 0  Easily annoyed or irritable 0 0 0  Afraid - awful might happen 0 0 0  Total GAD 7 Score 4 0 0  Anxiety Difficulty Not difficult at all Not difficult at all Not difficult at all    4. BMI 31.0-31.9,adult Up 10lbs today. She walks daily. She does have arthritis which can limit her sometimes.   Wt Readings from Last 3 Encounters:  12/04/20 208 lb 9.6 oz (94.6 kg)  06/05/20 198 lb (89.8 kg)  01/14/20 196 lb 6.4 oz (89.1 kg)    5. Mixed hyperlipidemia Trying to watch her diet, cut down on red meat and adding more vegetables to her diet. She has stopped taking her atorvastatin because of muscle and joint pain.   The 10-year ASCVD risk score Mikey Bussing DC Brooke Bonito., et al., 2013) is: 34.8%   Values used to calculate the score:     Age: 55 years     Sex: Female     Is Non-Hispanic African American: No     Diabetic: Yes     Tobacco smoker: No     Systolic Blood Pressure: 673 mmHg     Is BP treated: Yes     HDL Cholesterol: 61 mg/dL     Total Cholesterol: 210 mg/dL   Lab Results  Component Value Date   CHOL 210 (H)  06/05/2020   HDL 61 06/05/2020   LDLCALC 133 (H) 06/05/2020   TRIG 92 06/05/2020   CHOLHDL 3.4 06/05/2020    6. Acquired hypothyroidism  Currently on levothyroxine with no complications. No s/s of hypothyroidism.     Outpatient Encounter Medications as of 12/04/2020  Medication Sig  . atorvastatin (LIPITOR) 40 MG tablet Take 1 tablet (40 mg total) by mouth daily.  . Cholecalciferol (VITAMIN D3) 2000 UNITS TABS Take 2,000 Units by mouth daily.   Marland Kitchen ibuprofen (ADVIL,MOTRIN) 200 MG tablet Take 200 mg by mouth every 6 (six) hours as needed for mild pain.   Marland Kitchen levothyroxine (SYNTHROID) 100 MCG tablet Take 1 tablet (100 mcg total) by mouth daily.  Marland Kitchen lisinopril (ZESTRIL) 10 MG tablet TAKE 1 TABLET BY MOUTH EVERY DAY  . LORazepam (ATIVAN) 0.5 MG tablet Take 1 tablet (0.5 mg total) by mouth 2 (two) times daily as needed for anxiety.  Marland Kitchen omeprazole (PRILOSEC) 20 MG capsule TAKE 1 CAPSULE BY MOUTH EVERY DAY   No facility-administered encounter medications on file as of 12/04/2020.    Past Surgical History:  Procedure Laterality Date  . bladder tacked      Family History  Problem Relation Age of Onset  . Diabetes Mother   . Pneumonia Father   . Cancer Father        skin  . Cancer Sister        skin    New complaints: Having surgery on her bladder and urethra for prolapse on August 30th  Social history: Lives with husband  Controlled substance contract: 12/04/2020  Review of Systems  Constitutional: Negative for chills, fatigue and fever.  Respiratory: Negative for cough, shortness of breath and wheezing.   Cardiovascular: Negative for chest pain and palpitations.  Gastrointestinal: Negative for abdominal pain, constipation and diarrhea.  Genitourinary: Negative for dyspareunia, dysuria and hematuria.  Neurological: Negative for dizziness, syncope, light-headedness and numbness.  Psychiatric/Behavioral: Negative for confusion. The patient is nervous/anxious.    Vitals:   12/04/20  1051 12/04/20 1058  BP: (!) 167/85 (!) 164/81  Pulse: (!) 103   Temp: (!) 97.5 F (36.4 C)   SpO2: 99%    BP rechehck 160/82    Objective:   Physical Exam Constitutional:      Appearance: Normal appearance.  Cardiovascular:     Rate and Rhythm: Normal rate and regular rhythm.     Pulses: Normal pulses.     Heart sounds: Normal heart sounds.  Pulmonary:     Effort: Pulmonary effort is normal.     Breath sounds: Normal breath sounds.  Abdominal:     Palpations: Abdomen is soft.  Musculoskeletal:        General: Normal range of motion.     Cervical back: Normal range of motion and neck supple.  Skin:    General: Skin is warm and dry.     Capillary Refill: Capillary refill takes less than 2 seconds.  Neurological:     Mental Status: She is alert and oriented to person, place, and time.  Psychiatric:        Mood and Affect: Mood normal.        Behavior: Behavior normal.   BP (!) 164/81   Pulse (!) 103   Temp (!) 97.5 F (36.4 C) (Temporal)   Ht $R'5\' 6"'GD$  (1.676 m)   Wt 208 lb 9.6 oz (94.6 kg)   SpO2 99%   BMI 33.67 kg/m     Assessment & Plan:  NETHRA MEHLBERG comes in today with chief complaint of Medical Management of Chronic Issues   Diagnosis and orders addressed:  1. Primary hypertension Low sodium diet Continue to keep diary of blood pressure at home - lisinopril (ZESTRIL) 10 MG tablet; Take 1 tablet (10 mg total) by mouth daily.  Dispense: 90 tablet; Refill: 1  2. Gastroesophageal reflux disease without esophagitis Avoid spicy foods Do not eat 2 hours prior to bedtime - omeprazole (PRILOSEC) 20 MG capsule; TAKE 1 CAPSULE BY MOUTH EVERY DAY  Dispense: 90 capsule; Refill: 1  3. GAD (generalized anxiety disorder) Stress management - LORazepam (ATIVAN) 0.5 MG tablet; Take 1 tablet (0.5 mg total) by mouth 2 (two) times daily as needed for anxiety.  Dispense: 60 tablet; Refill: 5  4. BMI 31.0-31.9,adult Discussed diet and exercise for person with BMI >25 Will  recheck weight in 3-6 months  5. Mixed hyperlipidemia Low fat diet encouraged Patient agreed to try crestor $RemoveBef'10mg'zsExpcnJLM$  - 1 daily #90 1 refill.  6. Acquired hypothyroidism Labs pending - levothyroxine (SYNTHROID) 100 MCG tablet; Take 1 tablet (100 mcg total) by mouth daily.  Dispense: 90 tablet; Refill: 1  7. BMI 34.0-34.9,adult Discussed diet and exercise  for person with BMI >25 Will recheck weight in 3-6 months  Orders Placed This Encounter  Procedures  . CBC with Differential/Platelet  . CMP14+EGFR  . Lipid panel  . Thyroid Panel With TSH     Labs pending Health Maintenance reviewed Diet and exercise encouraged  Follow up plan: 6 months   [place], FNP

## 2020-12-04 NOTE — Addendum Note (Signed)
Addended by: Chevis Pretty on: 12/04/2020 11:53 AM   Modules accepted: Orders

## 2020-12-05 LAB — CBC WITH DIFFERENTIAL/PLATELET
Basophils Absolute: 0 10*3/uL (ref 0.0–0.2)
Basos: 1 %
EOS (ABSOLUTE): 0 10*3/uL (ref 0.0–0.4)
Eos: 1 %
Hematocrit: 40.8 % (ref 34.0–46.6)
Hemoglobin: 13.3 g/dL (ref 11.1–15.9)
Immature Grans (Abs): 0 10*3/uL (ref 0.0–0.1)
Immature Granulocytes: 0 %
Lymphocytes Absolute: 1.1 10*3/uL (ref 0.7–3.1)
Lymphs: 26 %
MCH: 29.3 pg (ref 26.6–33.0)
MCHC: 32.6 g/dL (ref 31.5–35.7)
MCV: 90 fL (ref 79–97)
Monocytes Absolute: 0.3 10*3/uL (ref 0.1–0.9)
Monocytes: 7 %
Neutrophils Absolute: 3 10*3/uL (ref 1.4–7.0)
Neutrophils: 65 %
Platelets: 94 10*3/uL — CL (ref 150–450)
RBC: 4.54 x10E6/uL (ref 3.77–5.28)
RDW: 12.5 % (ref 11.7–15.4)
WBC: 4.4 10*3/uL (ref 3.4–10.8)

## 2020-12-05 LAB — CMP14+EGFR
ALT: 17 IU/L (ref 0–32)
AST: 32 IU/L (ref 0–40)
Albumin/Globulin Ratio: 1.6 (ref 1.2–2.2)
Albumin: 4.6 g/dL (ref 3.8–4.8)
Alkaline Phosphatase: 226 IU/L — ABNORMAL HIGH (ref 44–121)
BUN/Creatinine Ratio: 12 (ref 12–28)
BUN: 8 mg/dL (ref 8–27)
Bilirubin Total: 0.8 mg/dL (ref 0.0–1.2)
CO2: 20 mmol/L (ref 20–29)
Calcium: 9.9 mg/dL (ref 8.7–10.3)
Chloride: 95 mmol/L — ABNORMAL LOW (ref 96–106)
Creatinine, Ser: 0.68 mg/dL (ref 0.57–1.00)
Globulin, Total: 2.8 g/dL (ref 1.5–4.5)
Glucose: 97 mg/dL (ref 65–99)
Potassium: 4.8 mmol/L (ref 3.5–5.2)
Sodium: 131 mmol/L — ABNORMAL LOW (ref 134–144)
Total Protein: 7.4 g/dL (ref 6.0–8.5)
eGFR: 94 mL/min/{1.73_m2} (ref >59)

## 2020-12-05 LAB — LIPID PANEL
Chol/HDL Ratio: 3.5 ratio (ref 0.0–4.4)
Cholesterol, Total: 211 mg/dL — ABNORMAL HIGH (ref 100–199)
HDL: 61 mg/dL (ref >39)
LDL Chol Calc (NIH): 133 mg/dL — ABNORMAL HIGH (ref 0–99)
Triglycerides: 94 mg/dL (ref 0–149)
VLDL Cholesterol Cal: 17 mg/dL (ref 5–40)

## 2020-12-05 LAB — THYROID PANEL WITH TSH
Free Thyroxine Index: 3.3 (ref 1.2–4.9)
T3 Uptake Ratio: 25 % (ref 24–39)
T4, Total: 13.3 ug/dL — ABNORMAL HIGH (ref 4.5–12.0)
TSH: 2.44 u[IU]/mL (ref 0.450–4.500)

## 2020-12-06 ENCOUNTER — Other Ambulatory Visit: Payer: Self-pay

## 2020-12-06 DIAGNOSIS — E785 Hyperlipidemia, unspecified: Secondary | ICD-10-CM

## 2021-01-09 ENCOUNTER — Other Ambulatory Visit: Payer: Medicare PPO

## 2021-01-09 ENCOUNTER — Other Ambulatory Visit: Payer: Self-pay

## 2021-01-09 DIAGNOSIS — E785 Hyperlipidemia, unspecified: Secondary | ICD-10-CM

## 2021-01-09 LAB — CBC WITH DIFFERENTIAL/PLATELET
Basophils Absolute: 0 10*3/uL (ref 0.0–0.2)
Basos: 1 %
EOS (ABSOLUTE): 0 10*3/uL (ref 0.0–0.4)
Eos: 1 %
Hematocrit: 39.9 % (ref 34.0–46.6)
Hemoglobin: 13.4 g/dL (ref 11.1–15.9)
Immature Grans (Abs): 0 10*3/uL (ref 0.0–0.1)
Immature Granulocytes: 0 %
Lymphocytes Absolute: 1.4 10*3/uL (ref 0.7–3.1)
Lymphs: 29 %
MCH: 30.1 pg (ref 26.6–33.0)
MCHC: 33.6 g/dL (ref 31.5–35.7)
MCV: 90 fL (ref 79–97)
Monocytes Absolute: 0.3 10*3/uL (ref 0.1–0.9)
Monocytes: 7 %
Neutrophils Absolute: 3.1 10*3/uL (ref 1.4–7.0)
Neutrophils: 62 %
Platelets: 129 10*3/uL — ABNORMAL LOW (ref 150–450)
RBC: 4.45 x10E6/uL (ref 3.77–5.28)
RDW: 12.9 % (ref 11.7–15.4)
WBC: 5 10*3/uL (ref 3.4–10.8)

## 2021-01-09 LAB — CMP14+EGFR
ALT: 18 IU/L (ref 0–32)
AST: 24 IU/L (ref 0–40)
Albumin/Globulin Ratio: 1.8 (ref 1.2–2.2)
Albumin: 4.8 g/dL (ref 3.8–4.8)
Alkaline Phosphatase: 206 IU/L — ABNORMAL HIGH (ref 44–121)
BUN/Creatinine Ratio: 9 — ABNORMAL LOW (ref 12–28)
BUN: 7 mg/dL — ABNORMAL LOW (ref 8–27)
Bilirubin Total: 0.7 mg/dL (ref 0.0–1.2)
CO2: 23 mmol/L (ref 20–29)
Calcium: 10.1 mg/dL (ref 8.7–10.3)
Chloride: 97 mmol/L (ref 96–106)
Creatinine, Ser: 0.82 mg/dL (ref 0.57–1.00)
Globulin, Total: 2.6 g/dL (ref 1.5–4.5)
Glucose: 104 mg/dL — ABNORMAL HIGH (ref 65–99)
Potassium: 4.2 mmol/L (ref 3.5–5.2)
Sodium: 136 mmol/L (ref 134–144)
Total Protein: 7.4 g/dL (ref 6.0–8.5)
eGFR: 77 mL/min/{1.73_m2} (ref >59)

## 2021-02-28 ENCOUNTER — Telehealth: Payer: Self-pay | Admitting: Gastroenterology

## 2021-02-28 ENCOUNTER — Encounter: Payer: Self-pay | Admitting: Gastroenterology

## 2021-02-28 NOTE — Telephone Encounter (Signed)
Inbound call from patient requesting a colonoscopy due to time. Last colonoscopy was 2011 and patient believes she would be due 2021 since it would be 10 years. On the procedure notes it states postpone till 12/2021. Could you verify this date of procedure?

## 2021-02-28 NOTE — Telephone Encounter (Signed)
She is due.  Please assist her in scheduling a colonoscopy screening.

## 2021-04-13 ENCOUNTER — Telehealth: Payer: Self-pay | Admitting: *Deleted

## 2021-04-13 NOTE — Telephone Encounter (Signed)
Dr. Fuller Plan,  This pt is scheduled for a recall colonoscopy on 05-09-21.  While prepping her chart for her PV on 04-23-21, noted she is scheduled for a anterior colporrhaphy with possible SSLF hysteropexy on 05-01-21.    I just wanted to make you aware of this procedure close to her colonoscopy date.  Ok to proceed?  Please advise.  Thanks, [employer]

## 2021-04-17 NOTE — Telephone Encounter (Signed)
Contacted pt to discuss cancelling procedure d/t upcoming surgery.  Pt stated that she cancelled her surgery for 8/30.    She felt that she needed to do her procedure with [facility] at this time and will reschedule the other surgery for a later date.  Confimed with pt PV and Procedure dates/times with pt.  Stated that she would be there in person.

## 2021-04-23 ENCOUNTER — Other Ambulatory Visit: Payer: Self-pay

## 2021-04-23 ENCOUNTER — Telehealth: Payer: Self-pay

## 2021-04-23 ENCOUNTER — Ambulatory Visit (AMBULATORY_SURGERY_CENTER): Payer: Self-pay

## 2021-04-23 VITALS — Ht 66.0 in | Wt 211.0 lb

## 2021-04-23 DIAGNOSIS — Z86010 Personal history of colonic polyps: Secondary | ICD-10-CM

## 2021-04-23 MED ORDER — NA SULFATE-K SULFATE-MG SULF 17.5-3.13-1.6 GM/177ML PO SOLN: 1.0000 | Freq: Once | ORAL | 0 refills | Status: AC

## 2021-04-23 NOTE — Progress Notes (Signed)
No egg or soy allergy known to patient  No issues with past sedation with any surgeries or procedures Patient denies ever being told they had issues or difficulty with intubation  No FH of Malignant Hyperthermia No diet pills per patient No home 02 use per patient  No blood thinners per patient  Pt denies issues with constipation  No A fib or A flutter  EMMI video to pt or via MyChart  COVID 19 guidelines implemented in PV today with Pt and RN  Pt is fully vaccinated  for Covid    NO PA's for preps discussed with pt In PV today  Discussed with pt there will be an out-of-pocket cost for prep and that varies from $0 to 70 dollars   Pt states she has had a dx of low plt count this year, noted that platelets were 94K on 12/04/20 then 129K on 5/10 after a month of vitamin b-12.  Pt states she does not have trouble with bruising, bleeding gums nor prolonged bleeding for minor cuts or scrapes since then.  Let her know I will check w/ Dr if he wants a plt ct before the procedure.  Due to the COVID-19 pandemic we are asking patients to follow certain guidelines.  Pt aware of COVID protocols and [facility] guidelines

## 2021-04-23 NOTE — Telephone Encounter (Signed)
Call to pt and let her know that she if fine to go ahead with the procedure without further testing.

## 2021-04-23 NOTE — Telephone Encounter (Signed)
Her platelet count has not been in a range to warrant concern for an endoscopic procedure. Generally we are concerned with platelet counts around 50k or lower. No additional testing needed at this time.

## 2021-04-23 NOTE — Telephone Encounter (Signed)
Hi Dr Fuller Plan,  I saw Kelly Lewis today in previsit, her colon is sched on 9/7 for hx of polyps. Pt states she has a recent dx of low platelet count.  Her platelets were 94K on 12/04/20 and then she started vitamin B-12 and platelet ct went up to 129K on 01/09/21.  I let pt know I would check w/ you if you think she would need a blood test prior to the procedure on 05/09/21.  Thank you, Etheleen Nicks RN

## 2021-04-23 NOTE — Telephone Encounter (Signed)
Ok Dr Fuller Plan, thank you, I did tell her that it is usually around 60K that is concerning but I wanted to make sure with you! Thanks.

## 2021-04-23 NOTE — Telephone Encounter (Signed)
I also forgot to add that pt states she has not had any issues with bruising or bleeding in the last several months since the low platelet count was found.  She does not have bleeding gums with brushing her teeth, no prolonged bleeding with minor cuts and scrapes and no bruising.

## 2021-05-09 ENCOUNTER — Other Ambulatory Visit: Payer: Self-pay

## 2021-05-09 ENCOUNTER — Ambulatory Visit (AMBULATORY_SURGERY_CENTER): Payer: Medicare PPO | Admitting: Gastroenterology

## 2021-05-09 ENCOUNTER — Encounter: Payer: Self-pay | Admitting: Gastroenterology

## 2021-05-09 VITALS — BP 128/53 | HR 70 | Temp 98.4°F | Resp 15 | Ht 66.0 in | Wt 211.0 lb

## 2021-05-09 DIAGNOSIS — D123 Benign neoplasm of transverse colon: Secondary | ICD-10-CM | POA: Diagnosis not present

## 2021-05-09 DIAGNOSIS — Z86010 Personal history of colonic polyps: Secondary | ICD-10-CM | POA: Diagnosis not present

## 2021-05-09 DIAGNOSIS — D122 Benign neoplasm of ascending colon: Secondary | ICD-10-CM | POA: Diagnosis not present

## 2021-05-09 DIAGNOSIS — D120 Benign neoplasm of cecum: Secondary | ICD-10-CM | POA: Diagnosis not present

## 2021-05-09 MED ORDER — SODIUM CHLORIDE 0.9 % IV SOLN: 500.0000 mL | Freq: Once | INTRAVENOUS | Status: DC

## 2021-05-09 NOTE — Progress Notes (Signed)
To pacu VSS. Report to Rn.tb 

## 2021-05-09 NOTE — Progress Notes (Signed)
History & Physical  Primary Care Physician:  Chevis Pretty, FNP Primary Gastroenterologist: Jerilynn Mages. Fuller Plan, MD  CHIEF COMPLAINT:  Personal history of colon polyps   HPI: Kelly Lewis is a 70 y.o. female with a history of 1 adenomatous colon polyp removed in 2011.  She presents today for colonoscopy.  No active gastrointestinal complaints.   Past Medical History:  Diagnosis Date   Arthritis    Chronic anxiety    GERD (gastroesophageal reflux disease)    H/O degenerative disc disease    Hypertension    Osteopenia 2021   Plantar fasciitis    Temporary low platelet count ([place]) 2022   94K then up to 104 in May of 2022   Thyroid disease     Past Surgical History:  Procedure Laterality Date   bladder tacked  1984   COLONOSCOPY  2011   1- [facility]   UPPER GASTROINTESTINAL ENDOSCOPY  2012   at [place], normal    Prior to Admission medications   Medication Sig Start Date End Date Taking? Authorizing Provider  Cholecalciferol (VITAMIN D3) 2000 UNITS TABS Take 2,000 Units by mouth daily.    Yes [provider]  Cranberry 300 MG tablet Take 300 mg by mouth daily. Utiva supplement , cranberry extract 36 PAC   Yes [provider]  ibuprofen (ADVIL,MOTRIN) 200 MG tablet Take 200 mg by mouth every 6 (six) hours as needed for mild pain.    Yes [provider]  levothyroxine (SYNTHROID) 100 MCG tablet Take 1 tablet (100 mcg total) by mouth daily. 12/04/20  Yes Martin, Mary-Margaret, FNP  lisinopril (ZESTRIL) 10 MG tablet Take 1 tablet (10 mg total) by mouth daily. 12/04/20  Yes Martin, Mary-Margaret, FNP  LORazepam (ATIVAN) 0.5 MG tablet Take 1 tablet (0.5 mg total) by mouth 2 (two) times daily as needed for anxiety. 12/04/20  Yes Hassell Done, Mary-Margaret, FNP  omeprazole (PRILOSEC) 20 MG capsule TAKE 1 CAPSULE BY MOUTH EVERY DAY 12/04/20  Yes Hassell Done, Mary-Margaret, FNP  vitamin B-12 (CYANOCOBALAMIN) 1000 MCG tablet Take 1,000 mcg by mouth daily.   Yes [provider]   rosuvastatin (CRESTOR) 10 MG tablet Take 1 tablet (10 mg total) by mouth daily. Patient not taking: No sig reported 12/04/20   Chevis Pretty, FNP  vitamin C (ASCORBIC ACID) 500 MG tablet Take 500 mg by mouth daily. Patient not taking: Reported on 05/09/2021    [provider]    Current Outpatient Medications  Medication Sig Dispense Refill   Cholecalciferol (VITAMIN D3) 2000 UNITS TABS Take 2,000 Units by mouth daily.      Cranberry 300 MG tablet Take 300 mg by mouth daily. Utiva supplement , cranberry extract 36 PAC     ibuprofen (ADVIL,MOTRIN) 200 MG tablet Take 200 mg by mouth every 6 (six) hours as needed for mild pain.      levothyroxine (SYNTHROID) 100 MCG tablet Take 1 tablet (100 mcg total) by mouth daily. 90 tablet 1   lisinopril (ZESTRIL) 10 MG tablet Take 1 tablet (10 mg total) by mouth daily. 90 tablet 1   LORazepam (ATIVAN) 0.5 MG tablet Take 1 tablet (0.5 mg total) by mouth 2 (two) times daily as needed for anxiety. 60 tablet 5   omeprazole (PRILOSEC) 20 MG capsule TAKE 1 CAPSULE BY MOUTH EVERY DAY 90 capsule 1   vitamin B-12 (CYANOCOBALAMIN) 1000 MCG tablet Take 1,000 mcg by mouth daily.     rosuvastatin (CRESTOR) 10 MG tablet Take 1 tablet (10 mg total) by mouth  daily. (Patient not taking: No sig reported) 90 tablet 1   vitamin C (ASCORBIC ACID) 500 MG tablet Take 500 mg by mouth daily. (Patient not taking: Reported on 05/09/2021)     Current Facility-Administered Medications  Medication Dose Route Frequency Provider Last Rate Last Admin   0.9 %  sodium chloride infusion  500 mL Intravenous Once Ladene Artist, MD        Allergies as of 05/09/2021 - Review Complete 05/09/2021  Allergen Reaction Noted   Amoxicillin-pot clavulanate Other (See Comments) 03/07/2020   Cephalexin Nausea And Vomiting 03/07/2020   Doxycycline Nausea And Vomiting 03/07/2020   Nitrofurantoin Nausea And Vomiting 03/07/2020   Ciprofloxacin hcl Nausea Only and Other (See Comments)  11/27/2015   Naprosyn [naproxen]  01/07/2013   Other Other (See Comments) 01/11/2019   Shingrix [zoster vac recomb adjuvanted]  01/11/2019   Sulfonamide derivatives  05/17/2010   Xylocaine [lidocaine hcl]  01/07/2013    Family History  Problem Relation Age of Onset   Colon polyps Mother    Diabetes Mother    Pneumonia Father    Cancer Father        skin   Colon polyps Sister    Cancer Sister        skin   Colon cancer Neg Hx    Esophageal cancer Neg Hx    Rectal cancer Neg Hx    Stomach cancer Neg Hx     Social History   Socioeconomic History   Marital status: Married    Spouse name: Laverna Peace   Number of children: 2   Years of education: College   Highest education level: Bachelor's degree (e.g., BA, AB, BS)  Occupational History   Occupation: Retired   Tobacco Use   Smoking status: Never    Passive exposure: Past (husband usually smokes outside or in vehicle)   Smokeless tobacco: Never  Vaping Use   Vaping Use: Never used  Substance and Sexual Activity   Alcohol use: No   Drug use: No   Sexual activity: Not Currently  Other Topics Concern   Not on file  Social History Narrative   Not on file   Social Determinants of Health   Financial Resource Strain: Not on file  Food Insecurity: Not on file  Transportation Needs: Not on file  Physical Activity: Not on file  Stress: Not on file  Social Connections: Not on file  Intimate Partner Violence: Not on file    Review of Systems:  All systems reviewed an negative except where noted in HPI.  Gen: Denies any fever, chills, sweats, anorexia, fatigue, weakness, malaise, weight loss, and sleep disorder CV: Denies chest pain, angina, palpitations, syncope, orthopnea, PND, peripheral edema, and claudication. Resp: Denies dyspnea at rest, dyspnea with exercise, cough, sputum, wheezing, coughing up blood, and pleurisy. GI: Denies vomiting blood, jaundice, and fecal incontinence.   Denies dysphagia or odynophagia. GU :  Denies urinary burning, blood in urine, urinary frequency, urinary hesitancy, nocturnal urination, and urinary incontinence. MS: Denies joint pain, limitation of movement, and swelling, stiffness, low back pain, extremity pain. Denies muscle weakness, cramps, atrophy.  Derm: Denies rash, itching, dry skin, hives, moles, warts, or unhealing ulcers.  Psych: Denies depression, anxiety, memory loss, suicidal ideation, hallucinations, paranoia, and confusion. Heme: Denies bruising, bleeding, and enlarged lymph nodes. Neuro:  Denies any headaches, dizziness, paresthesias. Endo:  Denies any problems with DM, thyroid, adrenal function.   Physical Exam: General:  Alert, well-developed, in NAD Head:  Normocephalic and atraumatic.  Eyes:  Sclera clear, no icterus.   Conjunctiva pink. Ears:  Normal auditory acuity. Mouth:  No deformity or lesions.  Neck:  Supple; no masses . Lungs:  Clear throughout to auscultation.   No wheezes, crackles, or rhonchi. No acute distress. Heart:  Regular rate and rhythm; no murmurs. Abdomen:  Soft, nondistended, nontender. No masses, hepatomegaly. No obvious masses.  Normal bowel .    Rectal:  Deferred   Msk:  Symmetrical without gross deformities.. Pulses:  Normal pulses noted. Extremities:  Without edema. Neurologic:  Alert and  oriented x4;  grossly normal neurologically. Skin:  Intact without significant lesions or rashes. Cervical Nodes:  No significant cervical adenopathy. Psych:  Alert and cooperative. Normal mood and affect.   Impression / Plan:   Personal history of adenomatous colon polyps here for surveillance colonoscopy.   This patient is appropriate for endoscopic procedures in the ambulatory setting.    Pricilla Riffle. Fuller Plan  05/09/2021, 11:33 AM

## 2021-05-09 NOTE — Progress Notes (Signed)
Pt's states no medical or surgical changes since previsit or office visit. VS assessed by D.T 

## 2021-05-09 NOTE — Patient Instructions (Signed)
Repeat Colonoscopy in 6 months for surveillance after piecemeal polypectomy. Resume previous diet and medications. Awaiting pathology results. No aspirin, ibuprofen, naproxen, or other non-steroidal anti-inflammatory drugs for 2 weeks after polyp removal.  YOU HAD AN ENDOSCOPIC PROCEDURE TODAY AT [facility]:   Refer to the procedure report that was given to you for any specific questions about what was found during the examination.  If the procedure report does not answer your questions, please call your gastroenterologist to clarify.  If you requested that your care partner not be given the details of your procedure findings, then the procedure report has been included in a sealed envelope for you to review at your convenience later.  YOU SHOULD EXPECT: Some feelings of bloating in the abdomen. Passage of more gas than usual.  Walking can help get rid of the air that was put into your GI tract during the procedure and reduce the bloating. If you had a lower endoscopy (such as a colonoscopy or flexible sigmoidoscopy) you may notice spotting of blood in your stool or on the toilet paper. If you underwent a bowel prep for your procedure, you may not have a normal bowel movement for a few days.  Please Note:  You might notice some irritation and congestion in your nose or some drainage.  This is from the oxygen used during your procedure.  There is no need for concern and it should clear up in a day or so.  SYMPTOMS TO REPORT IMMEDIATELY:  Following lower endoscopy (colonoscopy or flexible sigmoidoscopy):  Excessive amounts of blood in the stool  Significant tenderness or worsening of abdominal pains  Swelling of the abdomen that is new, acute  Fever of 100F or higher  For urgent or emergent issues, a gastroenterologist can be reached at any hour by calling [phone]. Do not use MyChart messaging for urgent concerns.    DIET:  We do recommend a small meal at first, but then  you may proceed to your regular diet.  Drink plenty of fluids but you should avoid alcoholic beverages for 24 hours.  ACTIVITY:  You should plan to take it easy for the rest of today and you should NOT DRIVE or use heavy machinery until tomorrow (because of the sedation medicines used during the test).    FOLLOW UP: Our staff will call the number listed on your records 48-72 hours following your procedure to check on you and address any questions or concerns that you may have regarding the information given to you following your procedure. If we do not reach you, we will leave a message.  We will attempt to reach you two times.  During this call, we will ask if you have developed any symptoms of COVID 19. If you develop any symptoms (ie: fever, flu-like symptoms, shortness of breath, cough etc.) before then, please call [phone].  If you test positive for Covid 19 in the 2 weeks post procedure, please call and report this information to [place].    If any biopsies were taken you will be contacted by phone or by letter within the next 1-3 weeks.  Please call us at [phone] if you have not heard about the biopsies in 3 weeks.    SIGNATURES/CONFIDENTIALITY: You and/or your care partner have signed paperwork which will be entered into your electronic medical record.  These signatures attest to the fact that that the information above on your After Visit Summary has been reviewed and is understood.  Full  responsibility of the confidentiality of this discharge information lies with you and/or your care-partner.  

## 2021-05-09 NOTE — Progress Notes (Signed)
Called to room to assist during endoscopic procedure.  Patient ID and intended procedure confirmed with present staff. Received instructions for my participation in the procedure from the performing physician.  

## 2021-05-09 NOTE — Op Note (Signed)
[facility] Patient Name: Kelly Lewis Procedure Date: 05/09/2021 11:34 AM MRN: [id number] Endoscopist: Ladene Artist , MD Age: 70 Referring MD:  Date of Birth: Aug 30, 1951 Gender: Female Account #: [id number] Procedure:                Colonoscopy Indications:              Surveillance: Personal history of adenomatous                            polyps on last colonoscopy > 5 years ago Medicines:                Monitored Anesthesia Care Procedure:                Pre-Anesthesia Assessment:                           - Prior to the procedure, a History and Physical                            was performed, and patient medications and                            allergies were reviewed. The patient's tolerance of                            previous anesthesia was also reviewed. The risks                            and benefits of the procedure and the sedation                            options and risks were discussed with the patient.                            All questions were answered, and informed consent                            was obtained. Prior Anticoagulants: The patient has                            taken no previous anticoagulant or antiplatelet                            agents. ASA Grade Assessment: II - A patient with                            mild systemic disease. After reviewing the risks                            and benefits, the patient was deemed in                            satisfactory condition to undergo the procedure.  After obtaining informed consent, the colonoscope                            was passed under direct vision. Throughout the                            procedure, the patient's blood pressure, pulse, and                            oxygen saturations were monitored continuously. The                            CF [id number] was introduced through the anus                            and advanced to the the  cecum, identified by                            appendiceal orifice and ileocecal valve. The                            ileocecal valve, appendiceal orifice, and rectum                            were photographed. The quality of the bowel                            preparation was adequate after extensive lavage,                            suction. The colonoscopy was performed without                            difficulty. The patient tolerated the procedure                            well. Scope In: 11:36:43 AM Scope Out: 12:08:07 PM Scope Withdrawal Time: 0 hours 29 minutes 20 seconds  Total Procedure Duration: 0 hours 31 minutes 24 seconds  Findings:                 The perianal and digital rectal examinations were                            normal.                           A 25 mm polyp was found in the ascending colon. The                            polyp was sessile. The polyp was removed with a                            piecemeal technique using a cold snare. Resection  and retrieval were complete. Two areas tattooed                            with an injection of 2 mL of Spot (carbon black) 8                            cm proximal to the polypectomy site.                           Five sessile polyps were found in the descending                            colon (1), transverse colon (1), ascending colon                            (2) and cecum (1). The polyps were 5 to 7 mm in                            size. These polyps were removed with a cold snare.                            Resection and retrieval were complete.                           Multiple small-mouthed diverticula were found in                            the left colon. There was no evidence of                            diverticular bleeding.                           External hemorrhoids were found during                            retroflexion. The hemorrhoids were small.                            The exam was otherwise without abnormality on                            direct and retroflexion views. Complications:            No immediate complications. Estimated blood loss:                            None. Estimated Blood Loss:     Estimated blood loss: none. Impression:               - One 25 mm polyp in the ascending colon, removed                            piecemeal using a cold snare. Resected and  retrieved. Tattooed.                           - Five 5 to 7 mm polyps in the descending colon, in                            the transverse colon, in the ascending colon and in                            the cecum, removed with a cold snare. Resected and                            retrieved.                           - Mild diverticulosis in the left colon.                           - External hemorrhoids.                           - The examination was otherwise normal on direct                            and retroflexion views. Recommendation:           - Repeat colonoscopy in 6 months for surveillance                            after piecemeal polypectomy with a more extensive                            bowel prep.                           - Patient has a contact number available for                            emergencies. The signs and symptoms of potential                            delayed complications were discussed with the                            patient. Return to normal activities tomorrow.                            Written discharge instructions were provided to the                            patient.                           - Resume previous diet.                           -  Continue present medications.                           - Await pathology results.                           - No aspirin, ibuprofen, naproxen, or other                            non-steroidal anti-inflammatory drugs for 2 weeks                             after polyp removal. Ladene Artist, MD 05/09/2021 12:15:43 PM This report has been signed electronically.

## 2021-05-11 ENCOUNTER — Telehealth: Payer: Self-pay | Admitting: *Deleted

## 2021-05-11 NOTE — Telephone Encounter (Signed)
  Follow up Call-  Call back number 05/09/2021  Post procedure Call Back phone  # [phone]  Permission to leave phone message Yes  Some recent data might be hidden     Patient questions:  Do you have a fever, pain , or abdominal swelling? Yes.   Slight pressure under ribs Pain Score  3 *  Have you tolerated food without any problems? Yes.    Have you been able to return to your normal activities? Yes.    Do you have any questions about your discharge instructions: Diet   No. Medications  No. Follow up visit  No.  Do you have questions or concerns about your Care? No.  Actions: * If pain score is 4 or above: No action needed, pain <4.Have you developed a fever since your procedure? no  2.   Have you had an respiratory symptoms (SOB or cough) since your procedure? no  3.   Have you tested positive for COVID 19 since your procedure no  4.   Have you had any family members/close contacts diagnosed with the COVID 19 since your procedure?  no   If yes to any of these questions please route to Joylene John, RN and Joella Prince, RN

## 2021-05-24 ENCOUNTER — Encounter: Payer: Self-pay | Admitting: Gastroenterology

## 2021-05-30 ENCOUNTER — Telehealth: Payer: Self-pay | Admitting: Nurse Practitioner

## 2021-05-30 NOTE — Telephone Encounter (Signed)
Patient declined the Medicare Wellness Visit with [facility]   Patient would like to see PCP first. Offered to schedule after appointment with PCP and she declined

## 2021-06-06 ENCOUNTER — Ambulatory Visit: Payer: Medicare PPO | Admitting: Nurse Practitioner

## 2021-06-06 ENCOUNTER — Other Ambulatory Visit: Payer: Self-pay

## 2021-06-06 ENCOUNTER — Encounter: Payer: Self-pay | Admitting: Nurse Practitioner

## 2021-06-06 VITALS — BP 134/71 | HR 85 | Temp 96.3°F | Resp 20 | Ht 66.0 in | Wt 209.0 lb

## 2021-06-06 DIAGNOSIS — Z23 Encounter for immunization: Secondary | ICD-10-CM | POA: Diagnosis not present

## 2021-06-06 DIAGNOSIS — E039 Hypothyroidism, unspecified: Secondary | ICD-10-CM

## 2021-06-06 DIAGNOSIS — K219 Gastro-esophageal reflux disease without esophagitis: Secondary | ICD-10-CM

## 2021-06-06 DIAGNOSIS — D696 Thrombocytopenia, unspecified: Secondary | ICD-10-CM

## 2021-06-06 DIAGNOSIS — I10 Essential (primary) hypertension: Secondary | ICD-10-CM | POA: Diagnosis not present

## 2021-06-06 DIAGNOSIS — F411 Generalized anxiety disorder: Secondary | ICD-10-CM

## 2021-06-06 DIAGNOSIS — Z6834 Body mass index (BMI) 34.0-34.9, adult: Secondary | ICD-10-CM | POA: Diagnosis not present

## 2021-06-06 DIAGNOSIS — E7800 Pure hypercholesterolemia, unspecified: Secondary | ICD-10-CM

## 2021-06-06 MED ORDER — LEVOTHYROXINE SODIUM 100 MCG PO TABS: 100.0000 ug | ORAL_TABLET | Freq: Every day | ORAL | 1 refills | Status: DC

## 2021-06-06 MED ORDER — LISINOPRIL 10 MG PO TABS: 10.0000 mg | ORAL_TABLET | Freq: Every day | ORAL | 1 refills | Status: DC

## 2021-06-06 MED ORDER — OMEPRAZOLE 20 MG PO CPDR: DELAYED_RELEASE_CAPSULE | ORAL | 1 refills | Status: DC

## 2021-06-06 MED ORDER — LORAZEPAM 0.5 MG PO TABS: 0.5000 mg | ORAL_TABLET | Freq: Two times a day (BID) | ORAL | 5 refills | Status: DC | PRN

## 2021-06-06 NOTE — Addendum Note (Signed)
Addended by: Chevis Pretty on: 06/06/2021 09:00 AM   Modules accepted: Orders

## 2021-06-06 NOTE — Patient Instructions (Signed)

## 2021-06-06 NOTE — Progress Notes (Addendum)
Subjective:    Patient ID: Kelly Lewis, female    DOB: [birth year], 70 y.o.   MRN: [id number]   Chief Complaint: medical management of chronic issues     HPI:  1. Primary hypertension No c/o chest pain, sob or headache. [place] not check blood pressure at home. BP Readings from Last 3 Encounters:  06/06/21 134/71  05/09/21 (!) 128/53  12/04/20 (!) 164/81      2. hyperlipidemia Was put on crestor at last visit and also tried atorvastatin but it causesd muscle aches and diarrhea. Lab Results  Component Value Date   CHOL 211 (H) 12/04/2020   HDL 61 12/04/2020   LDLCALC 133 (H) 12/04/2020   TRIG 94 12/04/2020   CHOLHDL 3.5 12/04/2020      3. Gastroesophageal reflux disease without esophagitis Is on omeprazole daily and is doing well.  4. GAD (generalized anxiety disorder) Is on ativan prn. GAD 7 : Generalized Anxiety Score 06/06/2021 12/04/2020 06/05/2020 11/23/2019  Nervous, Anxious, on Edge 1 1 0 0  Control/stop worrying 0 1 0 0  Worry too much - different things 0 1 0 0  Trouble relaxing 0 1 0 0  Restless 0 0 0 0  Easily annoyed or irritable 0 0 0 0  Afraid - awful might happen 0 0 0 0  Total GAD 7 Score 1 4 0 0  Anxiety Difficulty Not difficult at all Not difficult at all Not difficult at all Not difficult at all      5. BMI 34.0-34.9,adult No recent weight changes Wt Readings from Last 3 Encounters:  06/06/21 209 lb (94.8 kg)  05/09/21 211 lb (95.7 kg)  04/23/21 211 lb (95.7 kg)   BMI Readings from Last 3 Encounters:  06/06/21 33.73 kg/m  05/09/21 34.06 kg/m  04/23/21 34.06 kg/m      Outpatient Encounter Medications as of 06/06/2021  Medication Sig   Cholecalciferol (VITAMIN D3) 2000 UNITS TABS Take 2,000 Units by mouth daily.    Cranberry 300 MG tablet Take 300 mg by mouth daily. Utiva supplement , cranberry extract 36 PAC   ibuprofen (ADVIL,MOTRIN) 200 MG tablet Take 200 mg by mouth every 6 (six) hours as needed for mild pain.    levothyroxine  (SYNTHROID) 100 MCG tablet Take 1 tablet (100 mcg total) by mouth daily.   lisinopril (ZESTRIL) 10 MG tablet Take 1 tablet (10 mg total) by mouth daily.   LORazepam (ATIVAN) 0.5 MG tablet Take 1 tablet (0.5 mg total) by mouth 2 (two) times daily as needed for anxiety.   omeprazole (PRILOSEC) 20 MG capsule TAKE 1 CAPSULE BY MOUTH EVERY DAY   rosuvastatin (CRESTOR) 10 MG tablet Take 1 tablet (10 mg total) by mouth daily. (Patient not taking: No sig reported)   vitamin B-12 (CYANOCOBALAMIN) 1000 MCG tablet Take 1,000 mcg by mouth daily.   vitamin C (ASCORBIC ACID) 500 MG tablet Take 500 mg by mouth daily. (Patient not taking: Reported on 05/09/2021)   Facility-Administered Encounter Medications as of 06/06/2021  Medication   0.9 %  sodium chloride infusion    Past Surgical History:  Procedure Laterality Date   bladder tacked  1984   COLONOSCOPY  2011   1- [facility]   UPPER GASTROINTESTINAL ENDOSCOPY  2012   at [facility], normal    Family History  Problem Relation Age of Onset   Colon polyps Mother    Diabetes Mother    Pneumonia Father    Cancer Father  skin   Colon polyps Sister    Cancer Sister        skin   Colon cancer Neg Hx    Esophageal cancer Neg Hx    Rectal cancer Neg Hx    Stomach cancer Neg Hx     New complaints: None today  Social history: Lives with her husband  Controlled substance contract: n/a     Review of Systems  Constitutional:  Negative for diaphoresis.  Eyes:  Negative for pain.  Respiratory:  Negative for shortness of breath.   Cardiovascular:  Negative for chest pain, palpitations and leg swelling.  Gastrointestinal:  Negative for abdominal pain.  Endocrine: Negative for polydipsia.  Skin:  Negative for rash.  Neurological:  Negative for dizziness, weakness and headaches.  Hematological:  Does not bruise/bleed easily.  All other systems reviewed and are negative.     Objective:   Physical Exam Vitals and nursing note reviewed.   Constitutional:      General: She is not in acute distress.    Appearance: Normal appearance. She is well-developed.  HENT:     Head: Normocephalic.     Right Ear: Tympanic membrane normal.     Left Ear: Tympanic membrane normal.     Nose: Nose normal.     Mouth/Throat:     Mouth: Mucous membranes are moist.  Eyes:     Pupils: Pupils are equal, round, and reactive to light.  Neck:     Vascular: No carotid bruit or JVD.  Cardiovascular:     Rate and Rhythm: Normal rate and regular rhythm.     Heart sounds: Normal heart sounds.  Pulmonary:     Effort: Pulmonary effort is normal. No respiratory distress.     Breath sounds: Normal breath sounds. No wheezing or rales.  Chest:     Chest wall: No tenderness.  Abdominal:     General: Bowel sounds are normal. There is no distension or abdominal bruit.     Palpations: Abdomen is soft. There is no hepatomegaly, splenomegaly, mass or pulsatile mass.     Tenderness: There is no abdominal tenderness.  Musculoskeletal:        General: Normal range of motion.     Cervical back: Normal range of motion and neck supple.  Lymphadenopathy:     Cervical: No cervical adenopathy.  Skin:    General: Skin is warm and dry.  Neurological:     Mental Status: She is alert and oriented to person, place, and time.     Deep Tendon Reflexes: Reflexes are normal and symmetric.  Psychiatric:        Behavior: Behavior normal.        Thought Content: Thought content normal.        Judgment: Judgment normal.    BP 134/71   Pulse 85   Temp (!) 96.3 F (35.7 C) (Temporal)   Resp 20   Ht 5\' 6"  (1.676 m)   Wt 209 lb (94.8 kg)   SpO2 98%   BMI 33.73 kg/m        Assessment & Plan:   Kelly Lewis comes in today with chief complaint of Medical Management of Chronic Issues   Diagnosis and orders addressed:  1. Primary hypertension Low sodium diet - lisinopril (ZESTRIL) 10 MG tablet; Take 1 tablet (10 mg total) by mouth daily.  Dispense: 90  tablet; Refill: 1  2. Gastroesphageal reflux disease without esophagitis Avoid spicy foods Do not eat 2 hours prior to  bedtime  - omeprazole (PRILOSEC) 20 MG capsule; TAKE 1 CAPSULE BY MOUTH EVERY DAY  Dispense: 90 capsule; Refill: 1  3. GAD (generalized anxiety disorder) Sress management - LORazepam (ATIVAN) 0.5 MG tablet; Take 1 tablet (0.5 mg total) by mouth 2 (two) times daily as needed for anxiety.  Dispense: 60 tablet; Refill: 5  4. BMI 34.0-34.9,adult Discussed diet and exercise for person with BMI >25 Will recheck weight in 3-6 months   5. Acquired hypothyroidism Labs pending - levothyroxine (SYNTHROID) 100 MCG tablet; Take 1 tablet (100 mcg total) by mouth daily.  Dispense: 90 tablet; Refill: 1  6. Hyperlipidemia Discussed repatha- patient will think abouttrying  Labs pending Health Maintenance reviewed Diet and exercise encouraged  Follow up plan: 6 months   Mary-Margaret Hassell Done, FNP

## 2021-06-07 LAB — CBC WITH DIFFERENTIAL/PLATELET
Basophils Absolute: 0 10*3/uL (ref 0.0–0.2)
Basos: 1 %
EOS (ABSOLUTE): 0 10*3/uL (ref 0.0–0.4)
Eos: 0 %
Hematocrit: 37.6 % (ref 34.0–46.6)
Hemoglobin: 13 g/dL (ref 11.1–15.9)
Immature Grans (Abs): 0 10*3/uL (ref 0.0–0.1)
Immature Granulocytes: 0 %
Lymphocytes Absolute: 1 10*3/uL (ref 0.7–3.1)
Lymphs: 24 %
MCH: 29.5 pg (ref 26.6–33.0)
MCHC: 34.6 g/dL (ref 31.5–35.7)
MCV: 86 fL (ref 79–97)
Monocytes Absolute: 0.3 10*3/uL (ref 0.1–0.9)
Monocytes: 6 %
Neutrophils Absolute: 2.9 10*3/uL (ref 1.4–7.0)
Neutrophils: 69 %
Platelets: 79 10*3/uL — CL (ref 150–450)
RBC: 4.4 x10E6/uL (ref 3.77–5.28)
RDW: 12.4 % (ref 11.7–15.4)
WBC: 4.2 10*3/uL (ref 3.4–10.8)

## 2021-06-07 LAB — LIPID PANEL
Chol/HDL Ratio: 3.5 ratio (ref 0.0–4.4)
Cholesterol, Total: 203 mg/dL — ABNORMAL HIGH (ref 100–199)
HDL: 58 mg/dL (ref >39)
LDL Chol Calc (NIH): 128 mg/dL — ABNORMAL HIGH (ref 0–99)
Triglycerides: 94 mg/dL (ref 0–149)
VLDL Cholesterol Cal: 17 mg/dL (ref 5–40)

## 2021-06-07 LAB — CMP14+EGFR
ALT: 16 IU/L (ref 0–32)
AST: 28 IU/L (ref 0–40)
Albumin/Globulin Ratio: 1.7 (ref 1.2–2.2)
Albumin: 4.4 g/dL (ref 3.8–4.8)
Alkaline Phosphatase: 205 IU/L — ABNORMAL HIGH (ref 44–121)
BUN/Creatinine Ratio: 9 — ABNORMAL LOW (ref 12–28)
BUN: 7 mg/dL — ABNORMAL LOW (ref 8–27)
Bilirubin Total: 0.7 mg/dL (ref 0.0–1.2)
CO2: 20 mmol/L (ref 20–29)
Calcium: 10 mg/dL (ref 8.7–10.3)
Chloride: 96 mmol/L (ref 96–106)
Creatinine, Ser: 0.77 mg/dL (ref 0.57–1.00)
Globulin, Total: 2.6 g/dL (ref 1.5–4.5)
Glucose: 100 mg/dL — ABNORMAL HIGH (ref 70–99)
Potassium: 4.6 mmol/L (ref 3.5–5.2)
Sodium: 131 mmol/L — ABNORMAL LOW (ref 134–144)
Total Protein: 7 g/dL (ref 6.0–8.5)
eGFR: 83 mL/min/{1.73_m2} (ref >59)

## 2021-06-07 LAB — THYROID PANEL WITH TSH
Free Thyroxine Index: 3.6 (ref 1.2–4.9)
T3 Uptake Ratio: 25 % (ref 24–39)
T4, Total: 14.5 ug/dL — ABNORMAL HIGH (ref 4.5–12.0)
TSH: 0.83 u[IU]/mL (ref 0.450–4.500)

## 2021-06-07 NOTE — Addendum Note (Signed)
Addended by: Chevis Pretty on: 06/07/2021 01:39 PM   Modules accepted: Orders

## 2021-06-27 NOTE — Progress Notes (Signed)
[place] [place], [place] [address]   CLINIC:  Medical Oncology/Hematology  CONSULT NOTE  Patient Care Team: Chevis Pretty, FNP as PCP - General (Nurse Practitioner)  CHIEF COMPLAINTS/PURPOSE OF CONSULTATION:  Thrombocytopenia  HISTORY OF PRESENTING ILLNESS:  Kelly Lewis 70 y.o. female is here at the request of her primary care provider Chevis Pretty, FNP) due to thrombocytopenia.  Review of past labs shows normal CBC in 2012, with onset of thrombocytopenia in 2018, which has been progressive since that time.  Most recent platelets (06/06/2021) were 79.  She denies any abnormal bleeding such as epistaxis, hematemesis, hematochezia, melena, hematuria, or gum bleeding.  She has not noted any abnormal bruising or petechial rash.  She denies any B symptoms such as fever, chills, night sweats, unintentional weight loss.  She denies any new lumps or bumps.  Although fatty liver infiltration was seen on ultrasound in 2012, she denies any knowledge of previous liver disease.  She does not consume alcohol.  No known history of autoimmune or connective tissue diseases.  She denies taking any new medications or quinine containing substances.  She has been taking B12 supplement at home for the past 8 months.  Her a past medical history is otherwise notable for osteoarthritis, hypothyroidism, hypertension, anxiety, and GERD.  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  No significant chemical exposure.  She denies any tobacco, alcohol, or illicit drug use.  Family history is significant for father with NASH.  Her father and sister also had melanoma.  No family history of other cancers or blood disease.    MEDICAL HISTORY:  Past Medical History:  Diagnosis Date   Arthritis    Chronic anxiety    GERD (gastroesophageal reflux disease)    H/O degenerative disc disease    Hypertension    Osteopenia 2021    Plantar fasciitis    Temporary low platelet count ([place]) 2022   94K then up to 104 in May of 2022   Thyroid disease     SURGICAL HISTORY: Past Surgical History:  Procedure Laterality Date   bladder tacked  1984   COLONOSCOPY  2011   1- TA   UPPER GASTROINTESTINAL ENDOSCOPY  2012   at [place], normal    SOCIAL HISTORY: Social History   Socioeconomic History   Marital status: Married    Spouse name: Laverna Peace   Number of children: 2   Years of education: [employer] education level: Bachelor's degree (e.g., BA, AB, BS)  Occupational History   Occupation: Retired   Tobacco Use   Smoking status: Never    Passive exposure: Past (husband usually smokes outside or in vehicle)   Smokeless tobacco: Never  Vaping Use   Vaping Use: Never used  Substance and Sexual Activity   Alcohol use: No   Drug use: No   Sexual activity: Not Currently  Other Topics Concern   Not on file  Social History Narrative   Not on file   Social Determinants of Health   Financial Resource Strain: Not on file  Food Insecurity: Not on file  Transportation Needs: Not on file  Physical Activity: Not on file  Stress: Not on file  Social Connections: Not on file  Intimate Partner Violence: Not on file    FAMILY HISTORY: Family History  Problem Relation Age of Onset   Colon polyps Mother    Diabetes Mother    Pneumonia  Father    Cancer Father        skin   Colon polyps Sister    Cancer Sister        skin   Colon cancer Neg Hx    Esophageal cancer Neg Hx    Rectal cancer Neg Hx    Stomach cancer Neg Hx     ALLERGIES:  is allergic to amoxicillin-pot clavulanate, cephalexin, doxycycline, nitrofurantoin, ciprofloxacin hcl, naprosyn [naproxen], other, shingrix [zoster vac recomb adjuvanted], sulfonamide derivatives, and xylocaine [lidocaine hcl].  MEDICATIONS:  Current Outpatient Medications  Medication Sig Dispense Refill   Cholecalciferol (VITAMIN D3) 2000 UNITS TABS Take 2,000 Units by  mouth daily.      Cranberry 300 MG tablet Take 300 mg by mouth daily. Utiva supplement , cranberry extract 36 PAC     ibuprofen (ADVIL,MOTRIN) 200 MG tablet Take 200 mg by mouth every 6 (six) hours as needed for mild pain.      levothyroxine (SYNTHROID) 100 MCG tablet Take 1 tablet (100 mcg total) by mouth daily. 90 tablet 1   lisinopril (ZESTRIL) 10 MG tablet Take 1 tablet (10 mg total) by mouth daily. 90 tablet 1   LORazepam (ATIVAN) 0.5 MG tablet Take 1 tablet (0.5 mg total) by mouth 2 (two) times daily as needed for anxiety. 60 tablet 5   omeprazole (PRILOSEC) 20 MG capsule TAKE 1 CAPSULE BY MOUTH EVERY DAY 90 capsule 1   vitamin B-12 (CYANOCOBALAMIN) 1000 MCG tablet Take 1,000 mcg by mouth daily.     vitamin C (ASCORBIC ACID) 500 MG tablet Take 500 mg by mouth daily.     Current Facility-Administered Medications  Medication Dose Route Frequency Provider Last Rate Last Admin   0.9 %  sodium chloride infusion  500 mL Intravenous Once Ladene Artist, MD        REVIEW OF SYSTEMS:   Review of Systems  Constitutional:  Negative for appetite change, chills, diaphoresis, fatigue, fever and unexpected weight change.  HENT:   Negative for lump/mass and nosebleeds.   Eyes:  Negative for eye problems.  Respiratory:  Negative for cough, hemoptysis and shortness of breath.   Cardiovascular:  Negative for chest pain, leg swelling and palpitations.  Gastrointestinal:  Positive for nausea (occasional). Negative for abdominal pain, blood in stool, constipation, diarrhea and vomiting.  Genitourinary:  Negative for hematuria.        Bladder prolapse  Musculoskeletal:  Positive for arthralgias.  Skin: Negative.   Neurological:  Positive for headaches. Negative for dizziness and light-headedness.  Hematological:  Does not bruise/bleed easily.  Psychiatric/Behavioral:  The patient is nervous/anxious.      PHYSICAL EXAMINATION: ECOG PERFORMANCE STATUS: 1 - Symptomatic but completely  ambulatory  There were no vitals filed for this visit. There were no vitals filed for this visit.  Physical Exam Constitutional:      Appearance: Normal appearance. She is obese.  HENT:     Head: Normocephalic and atraumatic.     Mouth/Throat:     Mouth: Mucous membranes are moist.  Eyes:     Extraocular Movements: Extraocular movements intact.     Pupils: Pupils are equal, round, and reactive to light.  Cardiovascular:     Rate and Rhythm: Normal rate and regular rhythm.     Pulses: Normal pulses.     Heart sounds: Normal heart sounds.  Pulmonary:     Effort: Pulmonary effort is normal.     Breath sounds: Normal breath sounds.  Abdominal:  General: Bowel sounds are normal.     Palpations: Abdomen is soft.     Tenderness: There is no abdominal tenderness.  Musculoskeletal:        General: No swelling.     Right lower leg: No edema.     Left lower leg: No edema.  Lymphadenopathy:     Cervical: No cervical adenopathy.  Skin:    General: Skin is warm and dry.  Neurological:     General: No focal deficit present.     Mental Status: She is alert and oriented to person, place, and time.  Psychiatric:        Mood and Affect: Mood normal.        Behavior: Behavior normal.      LABORATORY DATA:  I have reviewed the data as listed Recent Results (from the past 2160 hour(s))  CBC with Differential/Platelet     Status: Abnormal   Collection Time: 06/06/21  9:09 AM  Result Value Ref Range   WBC 4.2 3.4 - 10.8 x10E3/uL   RBC 4.40 3.77 - 5.28 x10E6/uL   Hemoglobin 13.0 11.1 - 15.9 g/dL   Hematocrit 37.6 34.0 - 46.6 %   MCV 86 79 - 97 fL   MCH 29.5 26.6 - 33.0 pg   MCHC 34.6 31.5 - 35.7 g/dL   RDW 12.4 11.7 - 15.4 %   Platelets 79 (LL) 150 - 450 x10E3/uL   Neutrophils 69 Not Estab. %   Lymphs 24 Not Estab. %   Monocytes 6 Not Estab. %   Eos 0 Not Estab. %   Basos 1 Not Estab. %   Neutrophils Absolute 2.9 1.4 - 7.0 x10E3/uL   Lymphocytes Absolute 1.0 0.7 - 3.1  x10E3/uL   Monocytes Absolute 0.3 0.1 - 0.9 x10E3/uL   EOS (ABSOLUTE) 0.0 0.0 - 0.4 x10E3/uL   Basophils Absolute 0.0 0.0 - 0.2 x10E3/uL   Immature Granulocytes 0 Not Estab. %   Immature Grans (Abs) 0.0 0.0 - 0.1 x10E3/uL   Hematology Comments: Note:     Comment: Verified by microscopic examination.  [id number]+EGFR     Status: Abnormal   Collection Time: 06/06/21  9:09 AM  Result Value Ref Range   Glucose 100 (H) 70 - 99 mg/dL    Comment:               **Please note reference interval change**   BUN 7 (L) 8 - 27 mg/dL   Creatinine, Ser 0.77 0.57 - 1.00 mg/dL   eGFR 83 >59 mL/min/1.73   BUN/Creatinine Ratio 9 (L) 12 - 28   Sodium 131 (L) 134 - 144 mmol/L   Potassium 4.6 3.5 - 5.2 mmol/L   Chloride 96 96 - 106 mmol/L   CO2 20 20 - 29 mmol/L   Calcium 10.0 8.7 - 10.3 mg/dL   Total Protein 7.0 6.0 - 8.5 g/dL   Albumin 4.4 3.8 - 4.8 g/dL   Globulin, Total 2.6 1.5 - 4.5 g/dL   Albumin/Globulin Ratio 1.7 1.2 - 2.2   Bilirubin Total 0.7 0.0 - 1.2 mg/dL   Alkaline Phosphatase 205 (H) 44 - 121 IU/L   AST 28 0 - 40 IU/L   ALT 16 0 - 32 IU/L  Lipid panel     Status: Abnormal   Collection Time: 06/06/21  9:09 AM  Result Value Ref Range   Cholesterol, Total 203 (H) 100 - 199 mg/dL   Triglycerides 94 0 - 149 mg/dL   HDL 58 >39 mg/dL  VLDL Cholesterol Cal 17 5 - 40 mg/dL   LDL Chol Calc (NIH) 128 (H) 0 - 99 mg/dL   Chol/HDL Ratio 3.5 0.0 - 4.4 ratio    Comment:                                   T. Chol/HDL Ratio                                             Men  Women                               1/2 Avg.Risk  3.4    3.3                                   Avg.Risk  5.0    4.4                                2X Avg.Risk  9.6    7.1                                3X Avg.Risk 23.4   11.0   Thyroid Panel With TSH     Status: Abnormal   Collection Time: 06/06/21  9:09 AM  Result Value Ref Range   TSH 0.830 0.450 - 4.500 uIU/mL   T4, Total 14.5 (H) 4.5 - 12.0 ug/dL   T3 Uptake Ratio 25 24 - 39  %   Free Thyroxine Index 3.6 1.2 - 4.9    RADIOGRAPHIC STUDIES: I have personally reviewed the radiological images as listed and agreed with the findings in the report. No results found.  ASSESSMENT & PLAN: 1.  Thrombocytopenia - Review of past labs shows normal CBC in 2012, with onset of thrombocytopenia in 2018, which has been progressive since that time.  Most recent platelets (06/06/2021) were 79. - No bleeding, bruising, petechial rash -No unexplained fever, chills, night sweats, weight loss.  No new lumps or bumps. - No known history of liver disease, although her father did have NASH.  She denies any alcohol consumption. - No known history of autoimmune or connective tissue disease - No new medications or quinine containing substances - Abdominal ultrasound (2012) showed fatty infiltration of the liver - No lymphadenopathy or hepatosplenomegaly palpated on exam - Discussed with patient that thrombocytopenia may be caused by a variety of conditions, including chronic liver disease/hypersplenism, certain viral infections (HIV, hepatitis), immune system dysfunction, alcohol consumption, nutritional deficiencies, and medications.  Less frequently, bone marrow disorder such as MDS or bone marrow failures may be implicated. - PLAN: Labs today and RTC in 2 weeks to discuss results. - Check nutritional deficiencies with vitamin B12, methylmalonic acid, folate, copper, iron/TIBC, ferritin - We will check for underlying autoimmune process with rheumatoid factor and ANA - We will check repeat CBC with pathology smear review for any possible abnormal platelet morphology.  Check LDH and reticulocytes. - We will check common infectious causes of thrombocytopenia such as hepatitis, HIV, and H. Pylori - We will repeat abdominal  ultrasound due to history of fatty liver disease noted in 2012 and concern for worsening liver disease or hypersplenism  2.  Elevated blood pressure - Elevated blood pressure  172/76 during visit today, asymptomatic - Patient reports that she always has elevated blood pressure when she goes to the doctor, but that her blood pressure is 130s over 70s at home  3.   Other history - PMH: Osteoarthritis, hypothyroidism, hypertension, anxiety, GERD - SOCIAL: Retired Pharmacist, hospital.  Lives with her husband.  No tobacco, alcohol, drugs. - FAMILY: Father with NASH.  Father and sister with melanoma.   PLAN SUMMARY & DISPOSITION: - Labs today - Abdominal ultrasound - Follow-up in 3 to 4 weeks to discuss results  All questions were answered. The patient knows to call the clinic with any problems, questions or concerns.   Medical decision making: Moderate  Time spent on visit: I spent 25 minutes counseling the patient face to face. The total time spent in the appointment was 40 minutes and more than 50% was on counseling.  I, Tarri Abernethy PA-C, have seen this patient in conjunction with Dr. Derek Jack. Greater than 50% of visit was performed by Dr. Delton Coombes.    Harriett Rush, PA-C 06/28/2021 10:03 AM  DR. Lalanya Rufener: I have independently evaluated this patient face-to-face and agree with HPI written by Casey Burkitt, PA-C.  She has mild to moderate thrombocytopenia since 2012.  Previous ultrasound showed fatty liver and normal spleen in 2012.  She does not have any easy bruising or bleeding.  We will check labs for various etiologies of thrombocytopenia.  We will also check imaging of the spleen.  Differential diagnosis includes splenomegaly contributing to thrombocytopenia versus immune mediated thrombocytopenia.  MDS is less likely.  She will come back to discuss the labs and further plan.

## 2021-06-28 ENCOUNTER — Inpatient Hospital Stay (HOSPITAL_COMMUNITY): Payer: Medicare PPO

## 2021-06-28 ENCOUNTER — Ambulatory Visit (HOSPITAL_COMMUNITY): Payer: Medicare PPO | Admitting: Physician Assistant

## 2021-06-28 ENCOUNTER — Other Ambulatory Visit: Payer: Self-pay

## 2021-06-28 ENCOUNTER — Inpatient Hospital Stay (HOSPITAL_COMMUNITY): Payer: Medicare PPO | Attending: Physician Assistant | Admitting: Hematology

## 2021-06-28 VITALS — BP 172/76 | HR 92 | Temp 97.5°F | Resp 18 | Ht 66.0 in | Wt 209.7 lb

## 2021-06-28 DIAGNOSIS — K219 Gastro-esophageal reflux disease without esophagitis: Secondary | ICD-10-CM

## 2021-06-28 DIAGNOSIS — E039 Hypothyroidism, unspecified: Secondary | ICD-10-CM

## 2021-06-28 DIAGNOSIS — D696 Thrombocytopenia, unspecified: Secondary | ICD-10-CM

## 2021-06-28 DIAGNOSIS — I10 Essential (primary) hypertension: Secondary | ICD-10-CM

## 2021-06-28 DIAGNOSIS — F419 Anxiety disorder, unspecified: Secondary | ICD-10-CM | POA: Diagnosis not present

## 2021-06-28 DIAGNOSIS — M1990 Unspecified osteoarthritis, unspecified site: Secondary | ICD-10-CM | POA: Diagnosis not present

## 2021-06-28 LAB — VITAMIN B12: Vitamin B-12: 2305 pg/mL — ABNORMAL HIGH (ref 180–914)

## 2021-06-28 LAB — IRON AND TIBC
Iron: 70 ug/dL (ref 28–170)
Saturation Ratios: 15 % (ref 10.4–31.8)
TIBC: 462 ug/dL — ABNORMAL HIGH (ref 250–450)
UIBC: 392 ug/dL

## 2021-06-28 LAB — RETICULOCYTES
Immature Retic Fract: 4.7 % (ref 2.3–15.9)
RBC.: 4.19 MIL/uL (ref 3.87–5.11)
Retic Count, Absolute: 57.8 10*3/uL (ref 19.0–186.0)
Retic Ct Pct: 1.4 % (ref 0.4–3.1)

## 2021-06-28 LAB — HEPATITIS B SURFACE ANTIGEN: Hepatitis B Surface Ag: NONREACTIVE

## 2021-06-28 LAB — HEPATITIS B CORE ANTIBODY, TOTAL: Hep B Core Total Ab: NONREACTIVE

## 2021-06-28 LAB — FOLATE: Folate: 7.4 ng/mL (ref >5.9)

## 2021-06-28 LAB — HEPATITIS B SURFACE ANTIBODY,QUALITATIVE: Hep B S Ab: NONREACTIVE

## 2021-06-28 LAB — HIV ANTIBODY (ROUTINE TESTING W REFLEX): HIV Screen 4th Generation wRfx: NONREACTIVE

## 2021-06-28 LAB — FERRITIN: Ferritin: 13 ng/mL (ref 11–307)

## 2021-06-28 LAB — LACTATE DEHYDROGENASE: LDH: 121 U/L (ref 98–192)

## 2021-06-28 NOTE — Patient Instructions (Signed)
[place] at [facility] Discharge Instructions  You were seen today by Tarri Abernethy PA-C for your low platelets.  As we discussed, there are many different causes of low platelets, such as immune system dysfunction, liver disease, nutrient deficiencies, infections, and in some cases bone marrow disorders.    LABS: Check labs today before leaving the hospital (on the first floor)  OTHER TESTS: Abdominal ultrasound  MEDICATIONS: No changes to home medications  FOLLOW-UP APPOINTMENT: Office visit in about 3 to 4 weeks (after ultrasound)   Thank you for choosing [place] at [facility] to provide your oncology and hematology care.  To afford each patient quality time with our provider, please arrive at least 15 minutes before your scheduled appointment time.   If you have a lab appointment with the [facility] please come in thru the Main Entrance and check in at the main information desk.  You need to re-schedule your appointment should you arrive 10 or more minutes late.  We strive to give you quality time with our providers, and arriving late affects you and other patients whose appointments are after yours.  Also, if you no show three or more times for appointments you may be dismissed from the clinic at the providers discretion.     Again, thank you for choosing [facility].  Our hope is that these requests will decrease the amount of time that you wait before being seen by our physicians.       _____________________________________________________________  Should you have questions after your visit to [facility], please contact our office at [phone] and follow the prompts.  Our office hours are 8:00 a.m. and 4:30 p.m. Monday - Friday.  Please note that voicemails left after 4:00 p.m. may not be returned until the following business day.  We are closed weekends and major holidays.  You do have access to  a nurse 24-7, just call the main number to the clinic [phone] and do not press any options, hold on the line and a nurse will answer the phone.    For prescription refill requests, have your pharmacy contact our office and allow 72 hours.    Due to Covid, you will need to wear a mask upon entering the hospital. If you do not have a mask, a mask will be given to you at the Main Entrance upon arrival. For doctor visits, patients may have 1 support person age 8 or older with them. For treatment visits, patients can not have anyone with them due to social distancing guidelines and our immunocompromised population.

## 2021-06-29 LAB — H PYLORI, IGM, IGG, IGA AB
H Pylori IgG: 0.31 Index Value (ref 0.00–0.79)
H. Pylogi, Iga Abs: <9 units (ref 0.0–8.9)
H. Pylogi, Igm Abs: <9 units (ref 0.0–8.9)

## 2021-06-29 LAB — RHEUMATOID FACTOR: Rheumatoid fact SerPl-aCnc: <10 IU/mL (ref <14.0)

## 2021-06-29 LAB — HCV INTERPRETATION

## 2021-06-29 LAB — ANA: Anti Nuclear Antibody (ANA): NEGATIVE

## 2021-06-29 LAB — HCV AB W REFLEX TO QUANT PCR: HCV Ab: <0.1 s/co ratio (ref 0.0–0.9)

## 2021-06-30 LAB — COPPER, SERUM: Copper: 107 ug/dL (ref 80–158)

## 2021-07-02 LAB — PATHOLOGIST SMEAR REVIEW

## 2021-07-03 LAB — METHYLMALONIC ACID, SERUM: Methylmalonic Acid, Quantitative: 146 nmol/L (ref 0–378)

## 2021-07-09 ENCOUNTER — Ambulatory Visit (HOSPITAL_COMMUNITY)
Admission: RE | Admit: 2021-07-09 | Discharge: 2021-07-09 | Disposition: A | Discharge status: Home / Self Care | Payer: Medicare PPO | Source: Ambulatory Visit | Attending: Physician Assistant | Admitting: Physician Assistant

## 2021-07-09 ENCOUNTER — Other Ambulatory Visit: Payer: Self-pay

## 2021-07-09 DIAGNOSIS — K760 Fatty (change of) liver, not elsewhere classified: Secondary | ICD-10-CM | POA: Diagnosis not present

## 2021-07-09 DIAGNOSIS — D696 Thrombocytopenia, unspecified: Secondary | ICD-10-CM | POA: Insufficient documentation

## 2021-07-18 NOTE — Progress Notes (Signed)
[facility] [address], [place] [address]   CLINIC:  Medical Oncology/Hematology  PCP:  Bennie Pierini, FNP [address] [place] [address] [phone]   REASON FOR VISIT:  Follow-up for thrombocytopenia  PRIOR THERAPY: None  CURRENT THERAPY: Under work-up  INTERVAL HISTORY:  Kelly Lewis 70 y.o. female returns for routine follow-up of her thrombocytopenia.  She was seen for initial consultation by Dr. Ellin Saba and Rojelio Brenner PA-C on 06/28/2021.  At today's visit, she reports feeling somewhat anxious to hear the results of her work-up, but otherwise doing well.  No recent hospitalizations, surgeries, or changes in baseline health status.  Regarding her thrombocytopenia, she denies any abnormal bleeding such as epistaxis, hematemesis, hematochezia, melena, hematuria, or gum bleeding.  She has not noted any abnormal bruising or petechial rash.  She denies any B symptoms such as fever, chills, night sweats, unintentional weight loss.  She denies any new lumps or bumps.  Regarding her iron deficiency, she has not noticed any signs of GI blood loss, as above.  She denies any fatigue, pica, or restless legs.  She is not taking any iron tablets at home.  She has 100% energy and 40% appetite. She endorses that she is maintaining a stable weight.    REVIEW OF SYSTEMS:  Review of Systems  Constitutional:  Positive for appetite change. Negative for chills, diaphoresis, fatigue, fever and unexpected weight change.  HENT:   Negative for lump/mass and nosebleeds.   Eyes:  Negative for eye problems.  Respiratory:  Negative for cough, hemoptysis and shortness of breath.   Cardiovascular:  Negative for chest pain, leg swelling and palpitations.  Gastrointestinal:  Positive for nausea (With anxiety). Negative for abdominal pain, blood in stool, constipation, diarrhea and vomiting.  Genitourinary:  Negative for hematuria.   Skin: Negative.    Neurological:  Positive for headaches (With anxiety). Negative for dizziness and light-headedness.  Hematological:  Does not bruise/bleed easily.  Psychiatric/Behavioral:  The patient is nervous/anxious.      PAST MEDICAL/SURGICAL HISTORY:  Past Medical History:  Diagnosis Date   Arthritis    Chronic anxiety    GERD (gastroesophageal reflux disease)    H/O degenerative disc disease    Hypertension    Osteopenia 2021   Plantar fasciitis    Temporary low platelet count (HCC) 2022   94K then up to 104 in May of 2022   Thyroid disease    Past Surgical History:  Procedure Laterality Date   bladder tacked  1984   COLONOSCOPY  2011   1- TA   UPPER GASTROINTESTINAL ENDOSCOPY  2012   at [facility], normal     SOCIAL HISTORY:  Social History   Socioeconomic History   Marital status: Married    Spouse name: Chanetta Marshall   Number of children: 2   Years of education: [employer] education level: Bachelor's degree (e.g., BA, AB, BS)  Occupational History   Occupation: Retired   Tobacco Use   Smoking status: Never    Passive exposure: Past (husband usually smokes outside or in vehicle)   Smokeless tobacco: Never  Vaping Use   Vaping Use: Never used  Substance and Sexual Activity   Alcohol use: No   Drug use: No   Sexual activity: Not Currently  Other Topics Concern   Not on file  Social History Narrative   Not on file   Social Determinants of Health   Financial Resource Strain: Not on file  Food  Insecurity: Not on file  Transportation Needs: Not on file  Physical Activity: Not on file  Stress: Not on file  Social Connections: Not on file  Intimate Partner Violence: Not on file    FAMILY HISTORY:  Family History  Problem Relation Age of Onset   Colon polyps Mother    Diabetes Mother    Pneumonia Father    Cancer Father        skin   Colon polyps Sister    Cancer Sister        skin   Colon cancer Neg Hx    Esophageal cancer Neg Hx    Rectal cancer Neg Hx     Stomach cancer Neg Hx     CURRENT MEDICATIONS:  Outpatient Encounter Medications as of 07/19/2021  Medication Sig   Cholecalciferol (VITAMIN D3) 2000 UNITS TABS Take 2,000 Units by mouth daily.    Cranberry 300 MG tablet Take 300 mg by mouth daily. Utiva supplement , cranberry extract 36 PAC   ibuprofen (ADVIL,MOTRIN) 200 MG tablet Take 200 mg by mouth every 6 (six) hours as needed for mild pain.  (Patient not taking: Reported on 06/28/2021)   levothyroxine (SYNTHROID) 100 MCG tablet Take 1 tablet (100 mcg total) by mouth daily.   lisinopril (ZESTRIL) 10 MG tablet Take 1 tablet (10 mg total) by mouth daily.   LORazepam (ATIVAN) 0.5 MG tablet Take 1 tablet (0.5 mg total) by mouth 2 (two) times daily as needed for anxiety. (Patient not taking: Reported on 06/28/2021)   omeprazole (PRILOSEC) 20 MG capsule TAKE 1 CAPSULE BY MOUTH EVERY DAY   vitamin B-12 (CYANOCOBALAMIN) 1000 MCG tablet Take 1,000 mcg by mouth daily.   vitamin C (ASCORBIC ACID) 500 MG tablet Take 500 mg by mouth daily.   Facility-Administered Encounter Medications as of 07/19/2021  Medication   0.9 %  sodium chloride infusion    ALLERGIES:  Allergies  Allergen Reactions   Amoxicillin-Pot Clavulanate Other (See Comments)    Urinary retention   Cephalexin Nausea And Vomiting    Internal head pressure and nausea   Doxycycline Nausea And Vomiting    Gi and internal head pressure   Nitrofurantoin Nausea And Vomiting    Gi upset and internal head pressure   Ciprofloxacin Hcl Nausea Only and Other (See Comments)    Abdominal pain, anxiety, facial rash   Naprosyn [Naproxen]     gerd   Other Other (See Comments)    Family History of Reaction   Shingrix [Zoster Vac Recomb Adjuvanted]     Family History of Reaction   Sulfonamide Derivatives     REACTION: skin turns red   Xylocaine [Lidocaine Hcl]     Real red, hot and flushed all over. Pt reports the redness was from being nervous and wasn't actually a reaction.       PHYSICAL EXAM:  ECOG PERFORMANCE STATUS: 0 - Asymptomatic  There were no vitals filed for this visit. There were no vitals filed for this visit. Physical Exam Constitutional:      Appearance: Normal appearance. She is obese.  HENT:     Head: Normocephalic and atraumatic.     Mouth/Throat:     Mouth: Mucous membranes are moist.  Eyes:     Extraocular Movements: Extraocular movements intact.     Pupils: Pupils are equal, round, and reactive to light.  Cardiovascular:     Rate and Rhythm: Normal rate and regular rhythm.     Pulses: Normal pulses.  Heart sounds: Normal heart sounds.  Pulmonary:     Effort: Pulmonary effort is normal.     Breath sounds: Normal breath sounds.  Abdominal:     General: Bowel sounds are normal.     Palpations: Abdomen is soft.     Tenderness: There is no abdominal tenderness.  Musculoskeletal:        General: No swelling.     Right lower leg: No edema.     Left lower leg: No edema.  Lymphadenopathy:     Cervical: No cervical adenopathy.  Skin:    General: Skin is warm and dry.  Neurological:     General: No focal deficit present.     Mental Status: She is alert and oriented to person, place, and time.  Psychiatric:        Mood and Affect: Mood normal.        Behavior: Behavior normal.     LABORATORY DATA:  I have reviewed the labs as listed.  CBC    Component Value Date/Time   WBC 4.2 06/06/2021 0909   WBC 5.0 12/29/2019 0916   RBC 4.19 06/28/2021 1021   RBC 4.26 12/29/2019 0916   HGB 13.0 06/06/2021 0909   HCT 37.6 06/06/2021 0909   PLT 79 (LL) 06/06/2021 0909   MCV 86 06/06/2021 0909   MCH 29.5 06/06/2021 0909   MCH 29.6 12/29/2019 0916   MCHC 34.6 06/06/2021 0909   MCHC 33.1 12/29/2019 0916   RDW 12.4 06/06/2021 0909   LYMPHSABS 1.0 06/06/2021 0909   MONOABS 0.3 12/29/2019 0916   EOSABS 0.0 06/06/2021 0909   BASOSABS 0.0 06/06/2021 0909   CMP Latest Ref Rng & Units 06/06/2021 01/09/2021 12/04/2020  Glucose 70 - 99  mg/dL 100(H) 104(H) 97  BUN 8 - 27 mg/dL 7(L) 7(L) 8  Creatinine 0.57 - 1.00 mg/dL 0.77 0.82 0.68  Sodium 134 - 144 mmol/L 131(L) 136 131(L)  Potassium 3.5 - 5.2 mmol/L 4.6 4.2 4.8  Chloride 96 - 106 mmol/L 96 97 95(L)  CO2 20 - 29 mmol/L $RemoveB'20 23 20  'vBXtFJhR$ Calcium 8.7 - 10.3 mg/dL 10.0 10.1 9.9  Total Protein 6.0 - 8.5 g/dL 7.0 7.4 7.4  Total Bilirubin 0.0 - 1.2 mg/dL 0.7 0.7 0.8  Alkaline Phos 44 - 121 IU/L 205(H) 206(H) 226(H)  AST 0 - 40 IU/L 28 24 32  ALT 0 - 32 IU/L $Remov'16 18 17    'ifNZsZ$ DIAGNOSTIC IMAGING:  I have independently reviewed the relevant imaging and discussed with the patient.  ASSESSMENT & PLAN: 1.  Thrombocytopenia - Review of past labs shows normal CBC in 2012, with onset of thrombocytopenia in 2018, which has been progressive since that time.  Most recent platelets (06/06/2021) were 79. - Abdominal ultrasound (2012) showed fatty infiltration of the liver - Abdominal ultrasound (07/09/2021): Fatty liver with nodularity in liver surface suggesting possible cirrhosis; splenomegaly with 14 cm maximum diameter and splenic volume 690 mL - Mild thrombocytopenia noted on pathology smear review (06/28/2021) - Other work-up of thrombocytopenia was unremarkable.  Negative H. pylori, hepatitis B, hepatitis C, HIV.  Nutritional panel unremarkable with normal B12, folate, methylmalonic acid, copper.  ANA and rheumatoid factor negative..  LDH normal. - No bleeding, bruising, petechial rash  - No unexplained fever, chills, night sweats, weight loss.  No new lumps or bumps.   - No lymphadenopathy or hepatosplenomegaly palpated on exam  - Differential diagnosis favors thrombocytopenia secondary to liver disease and splenic sequestration.  If persistent or unexplainable abnormalities in  CBC, would consider bone marrow biopsy for further work-up. - PLAN: No indication for treatment at this time, but would consider further treatment if platelets were persistently less than 30. - We will refer to  gastroenterology for further work-up of fatty liver disease with possible cirrhosis - Repeat CBC and RTC in 3 months.  2.  Iron deficiency without anemia - Iron panel (06/28/2021) shows ferritin 13, iron saturation 15%, and TIBC 462 - Most recent CBC (06/06/2021) shows normal Hgb 13.0 - No signs or symptoms of GI bleeding - PLAN: We will start patient on oral iron supplementation with ferrous sulfate 325 mg daily taken with orange juice and at separate time of day then omeprazole (Prilosec). - Repeat CBC and iron panel at follow-up visit in 3 months.  3.  Fatty liver disease with possible cirrhosis - Discovered during work-up for thrombocytopenia, as above - PLAN: Referral to gastroenterology for further work-up and treatment   4.   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  Radiology recommendations for follow-up renal sonogram in 6 months to assess stability.   - PLAN: Repeat renal ultrasound in 6 months (May 2023).  5.  Other history - PMH: Osteoarthritis, hypothyroidism, hypertension, anxiety, GERD - SOCIAL: Retired Pharmacist, hospital.  Lives with her husband.  No tobacco, alcohol, drugs. - FAMILY: Father with NASH.  Father and sister with melanoma.    PLAN SUMMARY & DISPOSITION: Referral to GI (previously established with Dr. Fuller Plan in [place]) Start taking oral iron supplement Labs (CBC and iron panel) and RTC in 3 months  All questions were answered. The patient knows to call the clinic with any problems, questions or concerns.  Medical decision making: Moderate  Time spent on visit: I spent 20 minutes counseling the patient face to face. The total time spent in the appointment was 30 minutes and more than 50% was on counseling.   Harriett Rush, PA-C  07/19/2021 10:50  AM

## 2021-07-19 ENCOUNTER — Other Ambulatory Visit: Payer: Self-pay

## 2021-07-19 ENCOUNTER — Inpatient Hospital Stay (HOSPITAL_COMMUNITY): Payer: Medicare PPO | Attending: Physician Assistant | Admitting: Physician Assistant

## 2021-07-19 VITALS — BP 147/68 | HR 94 | Temp 97.3°F | Resp 18 | Wt 204.6 lb

## 2021-07-19 DIAGNOSIS — I10 Essential (primary) hypertension: Secondary | ICD-10-CM | POA: Insufficient documentation

## 2021-07-19 DIAGNOSIS — K219 Gastro-esophageal reflux disease without esophagitis: Secondary | ICD-10-CM | POA: Diagnosis not present

## 2021-07-19 DIAGNOSIS — K760 Fatty (change of) liver, not elsewhere classified: Secondary | ICD-10-CM | POA: Insufficient documentation

## 2021-07-19 DIAGNOSIS — F419 Anxiety disorder, unspecified: Secondary | ICD-10-CM | POA: Insufficient documentation

## 2021-07-19 DIAGNOSIS — E611 Iron deficiency: Secondary | ICD-10-CM | POA: Insufficient documentation

## 2021-07-19 DIAGNOSIS — K7460 Unspecified cirrhosis of liver: Secondary | ICD-10-CM

## 2021-07-19 DIAGNOSIS — D696 Thrombocytopenia, unspecified: Secondary | ICD-10-CM | POA: Diagnosis not present

## 2021-07-19 DIAGNOSIS — E039 Hypothyroidism, unspecified: Secondary | ICD-10-CM | POA: Diagnosis not present

## 2021-07-19 DIAGNOSIS — M1990 Unspecified osteoarthritis, unspecified site: Secondary | ICD-10-CM | POA: Insufficient documentation

## 2021-07-19 MED ORDER — FERROUS SULFATE 325 (65 FE) MG PO TBEC: 325.0000 mg | DELAYED_RELEASE_TABLET | Freq: Every day | ORAL | 3 refills | Status: DC

## 2021-07-19 NOTE — Patient Instructions (Signed)
[place] at [facility] Discharge Instructions  You were seen today by Tarri Abernethy PA-C for your low platelets ("thrombocytopenia").  As we discussed, this is most likely due to fatty liver disease with suspected early cirrhosis that is causing enlargement of your spleen.  We will refer you to a GI doctor (Dr. Fuller Plan at [facility] in [place]) for further testing and management of your liver disease.  The only other abnormality that we found was some mild iron deficiency.  We recommend that you start taking over-the-counter iron supplement (ferrous sulfate 325 mg) daily.  This can cause some stomach upset, so we recommend that you take it with food.  It is best absorbed by your body when taken with vitamin C and/or a glass of orange juice.  It may cause some constipation, and if this happens, you can use an over-the-counter stool softener of your choice.  We also found a small spot on your kidney that most likely represents a benign lesion.  We will check another ultrasound in 6 months just to make sure there are no changes.  LABS: Return in 3 months for repeat labs  OTHER TESTS: Referral to gastroenterology  MEDICATIONS: Iron supplement, as above  FOLLOW-UP APPOINTMENT: Office visit in 3 months, after labs   Thank you for choosing [place] at [facility] to provide your oncology and hematology care.  To afford each patient quality time with our provider, please arrive at least 15 minutes before your scheduled appointment time.   If you have a lab appointment with the [facility] please come in thru the Main Entrance and check in at the main information desk.  You need to re-schedule your appointment should you arrive 10 or more minutes late.  We strive to give you quality time with our providers, and arriving late affects you and other patients whose appointments are after yours.  Also, if you no show three or more times for appointments  you may be dismissed from the clinic at the providers discretion.     Again, thank you for choosing [facility].  Our hope is that these requests will decrease the amount of time that you wait before being seen by our physicians.       _____________________________________________________________  Should you have questions after your visit to [facility], please contact our office at [phone] and follow the prompts.  Our office hours are 8:00 a.m. and 4:30 p.m. Monday - Friday.  Please note that voicemails left after 4:00 p.m. may not be returned until the following business day.  We are closed weekends and major holidays.  You do have access to a nurse 24-7, just call the main number to the clinic [phone] and do not press any options, hold on the line and a nurse will answer the phone.    For prescription refill requests, have your pharmacy contact our office and allow 72 hours.    Due to Covid, you will need to wear a mask upon entering the hospital. If you do not have a mask, a mask will be given to you at the Main Entrance upon arrival. For doctor visits, patients may have 1 support person age 75 or older with them. For treatment visits, patients can not have anyone with them due to social distancing guidelines and our immunocompromised population.

## 2021-08-09 ENCOUNTER — Other Ambulatory Visit (INDEPENDENT_AMBULATORY_CARE_PROVIDER_SITE_OTHER): Payer: Medicare PPO

## 2021-08-09 ENCOUNTER — Encounter: Payer: Self-pay | Admitting: Gastroenterology

## 2021-08-09 ENCOUNTER — Ambulatory Visit: Payer: Medicare PPO | Admitting: Gastroenterology

## 2021-08-09 VITALS — BP 142/78 | HR 88 | Ht 66.5 in | Wt 199.0 lb

## 2021-08-09 DIAGNOSIS — Z86010 Personal history of colonic polyps: Secondary | ICD-10-CM

## 2021-08-09 DIAGNOSIS — R935 Abnormal findings on diagnostic imaging of other abdominal regions, including retroperitoneum: Secondary | ICD-10-CM | POA: Diagnosis not present

## 2021-08-09 DIAGNOSIS — K760 Fatty (change of) liver, not elsewhere classified: Secondary | ICD-10-CM

## 2021-08-09 LAB — BASIC METABOLIC PANEL
BUN: 8 mg/dL (ref 6–23)
CO2: 26 mEq/L (ref 19–32)
Calcium: 10.2 mg/dL (ref 8.4–10.5)
Chloride: 97 mEq/L (ref 96–112)
Creatinine, Ser: 0.68 mg/dL (ref 0.40–1.20)
GFR: 88.04 mL/min (ref >60.00)
Glucose, Bld: 95 mg/dL (ref 70–99)
Potassium: 4.1 mEq/L (ref 3.5–5.1)
Sodium: 130 mEq/L — ABNORMAL LOW (ref 135–145)

## 2021-08-09 LAB — PROTIME-INR
INR: 1.1 ratio — ABNORMAL HIGH (ref 0.8–1.0)
Prothrombin Time: 12.1 s (ref 9.6–13.1)

## 2021-08-09 NOTE — Progress Notes (Addendum)
History of Present Illness: This is a 70 year old female presenting for evaluation of an abdominal abnormal ultrasound as below.  She is accompanied by her husband.  Prior abdominal ultrasound in March 2012 showed fatty infiltration of the liver.  She has been evaluated for thrombocytopenia.  As part of that evaluation she underwent abdominal ultrasound.  Patient relates she she drank a minimal amount of alcohol on weekends for about 2 years in her 52s and no alcohol at all since.  She relates that her father had NASH.  No other family history of liver disease.  Based on recommendations from other physicians the patient is already started a weight loss program with increased exercise and a carb modified and fat modified diet.  She has lost 10 pounds and her blood pressure is under better control without medication.  Evaluation to date includes ANA, iron studies, Hep B, Hep C that were all negative or normal.  Patient relates she noted an increase in reflux symptoms with her bowel prep in September that persisted for a few days following the colonoscopy.  Abd [place] 07/09/2021 IMPRESSION: Fatty liver. Nodularity in the liver surface suggests possible cirrhosis. Enlarged spleen. 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 Follow-up renal sonogram in 6 months may be considered to assess stability.   Colonoscopy 05/2021 - One 25 mm polyp in the ascending colon, removed piecemeal using a cold snare. Resected and retrieved. Tattooed. - Five 5 to 7 mm polyps in the descending colon, in the transverse colon, in the ascending colon and in the cecum, removed with a cold snare. Resected and retrieved. - Mild diverticulosis in the left colon. - External hemorrhoids. - The examination was otherwise normal on direct and retroflexion views.  1. Surgical [P], colon, ascending, cecum, transverse, polyp (4) - TUBULAR ADENOMA WITHOUT HIGH-GRADE DYSPLASIA OR MALIGNANCY -  HYPERPLASTIC POLYP(S) - MULTIPLE STEP SECTIONS WERE EXAMINED 2. Surgical [P], colon, ascending, polyp (1) - TUBULAR ADENOMA(S) - NEGATIVE FOR HIGH-GRADE DYSPLASIA OR MALIGNANCY  Current Medications, Allergies, Past Medical History, Past Surgical History, Family History and Social History were reviewed in [employer] record.   Physical Exam: General: Well developed, well nourished, no acute distress Head: Normocephalic and atraumatic Eyes: Sclerae anicteric, EOMI Ears: Normal auditory acuity Mouth: Not examined, mask on during Covid-19 pandemic Lungs: Clear throughout to auscultation Heart: Regular rate and rhythm; soft systolic murmur; no rubs or bruits Abdomen: Soft, non tender and non distended. No masses, hepatosplenomegaly or hernias noted. Normal Bowel sounds Rectal: Not done Musculoskeletal: Symmetrical with no gross deformities  Pulses:  Normal pulses noted Extremities: No clubbing, cyanosis, edema or deformities noted Neurological: Alert oriented x 4, grossly nonfocal Psychological:  Alert and cooperative. Normal mood and affect   Assessment and Recommendations:  Abnormal abdominal US with a nodular liver surface suggesting cirrhosis, hepatic steatosis and splenomegaly.  Thrombocytopenia.  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  Check PT/INR, ASMA, AMA, A1AT, tTG, IgA, IgG, ceruloplasmin, ACE, Hep A total.  Start Hep B and if needed Hep A vaccinations at next visit. No more than 2g Tylenol daily. Schedule CT AP to further evaluate for more definitive evidence of cirrhosis. EGD with colonoscopy in March 2023 to evaluate for varices and portal gastropathy.   Personal history of a 25 mm adenomatous colon polyp removed by piecemeal polypectomy in September 2022. Repeat colonoscopy in March 2023. Zofran before prep doses. Increase omeprazole to 20 mg po bid around the time of the prep.  GERD. Follow antireflux measures.  Continue omeprazole 20 mg p.o. daily.  Tums as  needed.  Further evaluation with EGD as above. Soft systolic murmur.  Echocardiogram performed in March 2012 showed aortic valve sclerosis without stenosis and mild TR.  Defer further evaluation as indicated to her PCP.

## 2021-08-09 NOTE — Patient Instructions (Addendum)
If you are age 70 or older, your body mass index should be between 23-30. Your Body mass index is 31.64 kg/m. If this is out of the aforementioned range listed, please consider follow up with your Primary Care Provider. ________________________________________________________  The [facility] providers would like to encourage you to use [facility] to communicate with providers for non-urgent requests or questions.  Due to long hold times on the telephone, sending your provider a message by [facility] may be a faster and more efficient way to get a response.  Please allow 48 business hours for a response.  Please remember that this is for non-urgent requests.  _______________________________________________________  Dennis Bast have been scheduled for a CT scan of the abdomen and pelvis at [facility] ([address]---this is in the same building as [employer]).   You are scheduled on 08/22/2021 at 3:00 pm. You should arrive 15 minutes prior to your appointment time for registration. Please follow the written instructions below on the day of your exam:  WARNING: IF YOU ARE ALLERGIC TO IODINE/X-RAY DYE, PLEASE NOTIFY RADIOLOGY IMMEDIATELY AT [phone]! YOU WILL BE GIVEN A 13 HOUR PREMEDICATION PREP.  1) Do not eat anything after 11:00 am (4 hours prior to your test) 2) You have been given 2 bottles of oral contrast to drink. The solution may taste better if refrigerated, but do NOT add ice or any other liquid to this solution. Shake well before drinking.    Drink 1 bottle of contrast @ 1:00 pm (2 hours prior to your exam)  Drink 1 bottle of contrast @ 2:00 pm (1 hour prior to your exam)  You may take any medications as prescribed with a small amount of water, if necessary. If you take any of the following medications: METFORMIN, GLUCOPHAGE, GLUCOVANCE, AVANDAMET, RIOMET, FORTAMET, [place] MET, JANUMET, GLUMETZA or METAGLIP, you MAY be asked to HOLD this medication 48 hours AFTER the  exam.  The purpose of you drinking the oral contrast is to aid in the visualization of your intestinal tract. The contrast solution may cause some diarrhea. Depending on your individual set of symptoms, you may also receive an intravenous injection of x-ray contrast/dye. Plan on being at [facility] for 30 minutes or longer, depending on the type of exam you are having performed.  This test typically takes 30-45 minutes to complete.  If you have any questions regarding your exam or if you need to reschedule, you may call the CT department at [phone] between the hours of 8:00 am and 5:00 pm, Monday-Friday.  ____________________________________________________________  Your provider has requested that you go to the basement level for lab work before leaving today. Press "B" on the elevator. The lab is located at the first door on the left as you exit the elevator.  You have been scheduled to follow up with Dr Fuller Plan on September 19, 2021 at 11:10 am. At that we will discuss starting Hep A and Hep B vaccinations.  Thank you for entrusting me with your care and choosing [facility].  Dr. Fuller Plan

## 2021-08-13 LAB — ANGIOTENSIN CONVERTING ENZYME: Angiotensin-Converting Enzyme: 13 U/L (ref 9–67)

## 2021-08-13 LAB — IGG: IgG (Immunoglobin G), Serum: 1230 mg/dL (ref 600–1540)

## 2021-08-13 LAB — IGA: Immunoglobulin A: 197 mg/dL (ref 70–320)

## 2021-08-13 LAB — CERULOPLASMIN: Ceruloplasmin: 28 mg/dL (ref 18–53)

## 2021-08-13 LAB — MITOCHONDRIAL ANTIBODIES: Mitochondrial M2 Ab, IgG: <=20 U (ref <=20.0)

## 2021-08-13 LAB — TISSUE TRANSGLUTAMINASE, IGA: (tTG) Ab, IgA: <1 U/mL

## 2021-08-13 LAB — ALPHA-1-ANTITRYPSIN: A-1 Antitrypsin, Ser: 191 mg/dL (ref 83–199)

## 2021-08-13 LAB — ANTI-SMOOTH MUSCLE ANTIBODY, IGG: Actin (Smooth Muscle) Antibody (IGG): <20 U (ref <20)

## 2021-08-13 LAB — HEPATITIS A ANTIBODY, TOTAL: Hepatitis A AB,Total: REACTIVE — AB

## 2021-08-22 ENCOUNTER — Inpatient Hospital Stay: Admission: RE | Admit: 2021-08-22 | Payer: Medicare PPO | Source: Ambulatory Visit

## 2021-09-07 ENCOUNTER — Ambulatory Visit (INDEPENDENT_AMBULATORY_CARE_PROVIDER_SITE_OTHER)
Admission: RE | Admit: 2021-09-07 | Discharge: 2021-09-07 | Disposition: A | Discharge status: Home / Self Care | Payer: Medicare PPO | Source: Ambulatory Visit | Attending: Gastroenterology | Admitting: Gastroenterology

## 2021-09-07 ENCOUNTER — Other Ambulatory Visit: Payer: Self-pay

## 2021-09-07 DIAGNOSIS — R935 Abnormal findings on diagnostic imaging of other abdominal regions, including retroperitoneum: Secondary | ICD-10-CM | POA: Diagnosis not present

## 2021-09-07 DIAGNOSIS — Z86010 Personal history of colonic polyps: Secondary | ICD-10-CM

## 2021-09-07 DIAGNOSIS — K7469 Other cirrhosis of liver: Secondary | ICD-10-CM | POA: Diagnosis not present

## 2021-09-07 DIAGNOSIS — D696 Thrombocytopenia, unspecified: Secondary | ICD-10-CM | POA: Diagnosis not present

## 2021-09-07 DIAGNOSIS — K760 Fatty (change of) liver, not elsewhere classified: Secondary | ICD-10-CM

## 2021-09-07 DIAGNOSIS — K766 Portal hypertension: Secondary | ICD-10-CM | POA: Diagnosis not present

## 2021-09-07 DIAGNOSIS — N8110 Cystocele, unspecified: Secondary | ICD-10-CM | POA: Diagnosis not present

## 2021-09-07 MED ORDER — IOHEXOL 300 MG/ML  SOLN
100.0000 mL | Freq: Once | INTRAMUSCULAR | Status: AC | PRN
  Administered 2021-09-07: 100 mL via INTRAVENOUS

## 2021-09-12 ENCOUNTER — Inpatient Hospital Stay: Admission: RE | Admit: 2021-09-12 | Payer: Medicare PPO | Source: Ambulatory Visit

## 2021-09-19 ENCOUNTER — Encounter: Payer: Self-pay | Admitting: Gastroenterology

## 2021-09-19 ENCOUNTER — Ambulatory Visit: Payer: Medicare PPO | Admitting: Gastroenterology

## 2021-09-19 VITALS — BP 158/82 | HR 76 | Ht 66.5 in | Wt 196.0 lb

## 2021-09-19 DIAGNOSIS — Z86010 Personal history of colonic polyps: Secondary | ICD-10-CM | POA: Diagnosis not present

## 2021-09-19 DIAGNOSIS — K7460 Unspecified cirrhosis of liver: Secondary | ICD-10-CM

## 2021-09-19 DIAGNOSIS — K219 Gastro-esophageal reflux disease without esophagitis: Secondary | ICD-10-CM | POA: Diagnosis not present

## 2021-09-19 MED ORDER — ONDANSETRON HCL 4 MG PO TABS: 4.0000 mg | ORAL_TABLET | Freq: Two times a day (BID) | ORAL | 2 refills | Status: DC | PRN

## 2021-09-19 MED ORDER — PLENVU 140 G PO SOLR: 1.0000 | Freq: Once | ORAL | 0 refills | Status: AC

## 2021-09-19 MED ORDER — GOLYTELY 236 G PO SOLR: 4000.0000 mL | Freq: Once | ORAL | 0 refills | Status: AC

## 2021-09-19 NOTE — Patient Instructions (Signed)
We have sent the following medications to your pharmacy for you to pick up at your convenience:ondansetron.  You have been scheduled for an endoscopy and colonoscopy. Please follow the written instructions given to you at your visit today. Please pick up your prep supplies at the pharmacy within the next 1-3 days. If you use inhalers (even only as needed), please bring them with you on the day of your procedure.  Due to recent changes in healthcare laws, you may see the results of your imaging and laboratory studies on MyChart before your provider has had a chance to review them.  We understand that in some cases there may be results that are confusing or concerning to you. Not all laboratory results come back in the same time frame and the provider may be waiting for multiple results in order to interpret others.  Please give [place] 48 hours in order for your provider to thoroughly review all the results before contacting the office for clarification of your results.   The [place] [facility] providers would like to encourage you to use [facility] to communicate with providers for non-urgent requests or questions.  Due to long hold times on the telephone, sending your provider a message by [facility] may be a faster and more efficient way to get a response.  Please allow 48 business hours for a response.  Please remember that this is for non-urgent requests.   Thank you for choosing me and [facility].  Pricilla Riffle. Dagoberto Ligas., MD., Mar[facility] Regal

## 2021-09-19 NOTE — Progress Notes (Signed)
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  She is accompanied by her husband.  Hepatic serologic evaluation was unrevealing.  CT AP findings were reviewed in detail with patient and her husband.  In October she started standard dietary measures for hepatic steatosis including a weight loss diet.  Her weight has decreased from 209 in October to 196 today.  She relates the last colonoscopy prep she had made her reflux flare for several days and she would like a different prep.  She relates that ondansetron intermittently for nausea and dyspeptic symptoms has been helpful.  No other gastrointestinal complaints.   CT AP 09/07/2021 1. Cirrhotic changes involving the liver with portal venous hypertension, portal venous collaterals and splenomegaly. No ascites. 2. No hepatic lesions to suggest hepatoma. 3. No acute abdominal/pelvic findings, mass lesions or lymphadenopathy. 4. 3.3 cm simple appearing left ovarian cyst. Recommend follow-up pelvic ultrasound examination in 6-12 months. 5. Moderate cystocele. 6. Aortic atherosclerosis.     Current Medications, Allergies, Past Medical History, Past Surgical History, Family History and Social History were reviewed in [employer] record.   Physical Exam: General: Well developed, well nourished, no acute distress Head: Normocephalic and atraumatic Eyes: Sclerae anicteric, EOMI Ears: Normal auditory acuity Mouth: Not examined, mask on during Covid-19 pandemic Extremities: No clubbing, cyanosis, edema or deformities noted Neurological: Alert oriented x 4, grossly nonfocal Psychological:  Alert and cooperative. 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  Reviewed long-term carb modified, fat modified, weight loss diet to achieve a normal BMI and she has an excellent start with  appropriate dietary changes and a 13 pound weight loss since October.  Provide hepatitis B vaccine. Hepatitis A antibodies were positive.  Plan for 40-month evaluations with an office appt, blood work and an RUQ [place].  Schedule EGD to screen for varices. The risks (including bleeding, perforation, infection, missed lesions, medication reactions and possible hospitalization or surgery if complications occur), benefits, and alternatives to endoscopy with possible biopsy and possible dilation were discussed with the patient and they consent to proceed.  Ample time was provided for questions and I addressed their questions to their satisfaction. Adenomatous colon polyp, 2.5 cm, post piecemeal removal in September 2022.  Schedule colonoscopy in March to assess piecemeal polypectomy site. The risks (including bleeding, perforation, infection, missed lesions, medication reactions and possible hospitalization or surgery if complications occur), benefits, and alternatives to colonoscopy with possible biopsy and possible polypectomy were discussed with the patient and they consent to proceed.  Use split dose unflavored Colyte or similar product for prep per patient request. GERD.  Follow antireflux measures and continue omeprazole 20 mg daily. Dyspepsia and nausea.  Ondansetron 4 mg p.o. 2 times daily as needed. Call if symptoms not controlled and consider an antispasmotic.  GAD

## 2021-09-27 ENCOUNTER — Ambulatory Visit (INDEPENDENT_AMBULATORY_CARE_PROVIDER_SITE_OTHER): Payer: Medicare PPO | Admitting: Gastroenterology

## 2021-09-27 DIAGNOSIS — Z23 Encounter for immunization: Secondary | ICD-10-CM

## 2021-09-27 DIAGNOSIS — K7460 Unspecified cirrhosis of liver: Secondary | ICD-10-CM

## 2021-09-28 DIAGNOSIS — Z1231 Encounter for screening mammogram for malignant neoplasm of breast: Secondary | ICD-10-CM | POA: Diagnosis not present

## 2021-10-14 ENCOUNTER — Other Ambulatory Visit (HOSPITAL_COMMUNITY): Payer: Self-pay | Admitting: Physician Assistant

## 2021-10-14 DIAGNOSIS — E611 Iron deficiency: Secondary | ICD-10-CM

## 2021-10-16 ENCOUNTER — Telehealth: Payer: Self-pay | Admitting: Nurse Practitioner

## 2021-10-16 NOTE — Telephone Encounter (Signed)
Patient declined the Medicare Wellness Visit with [facility]  

## 2021-10-18 ENCOUNTER — Inpatient Hospital Stay (HOSPITAL_COMMUNITY): Payer: Medicare PPO | Attending: Hematology

## 2021-10-18 DIAGNOSIS — K7581 Nonalcoholic steatohepatitis (NASH): Secondary | ICD-10-CM | POA: Insufficient documentation

## 2021-10-18 DIAGNOSIS — K7460 Unspecified cirrhosis of liver: Secondary | ICD-10-CM | POA: Diagnosis not present

## 2021-10-18 DIAGNOSIS — E039 Hypothyroidism, unspecified: Secondary | ICD-10-CM | POA: Insufficient documentation

## 2021-10-18 DIAGNOSIS — I10 Essential (primary) hypertension: Secondary | ICD-10-CM | POA: Insufficient documentation

## 2021-10-18 DIAGNOSIS — M1990 Unspecified osteoarthritis, unspecified site: Secondary | ICD-10-CM | POA: Insufficient documentation

## 2021-10-18 DIAGNOSIS — K219 Gastro-esophageal reflux disease without esophagitis: Secondary | ICD-10-CM | POA: Insufficient documentation

## 2021-10-18 DIAGNOSIS — D696 Thrombocytopenia, unspecified: Secondary | ICD-10-CM | POA: Diagnosis not present

## 2021-10-18 DIAGNOSIS — F419 Anxiety disorder, unspecified: Secondary | ICD-10-CM | POA: Diagnosis not present

## 2021-10-18 DIAGNOSIS — D509 Iron deficiency anemia, unspecified: Secondary | ICD-10-CM | POA: Insufficient documentation

## 2021-10-18 DIAGNOSIS — E611 Iron deficiency: Secondary | ICD-10-CM

## 2021-10-18 LAB — CBC WITH DIFFERENTIAL/PLATELET
Abs Immature Granulocytes: 0.01 10*3/uL (ref 0.00–0.07)
Basophils Absolute: 0 10*3/uL (ref 0.0–0.1)
Basophils Relative: 1 %
Eosinophils Absolute: 0 10*3/uL (ref 0.0–0.5)
Eosinophils Relative: 1 %
HCT: 36.9 % (ref 36.0–46.0)
Hemoglobin: 12.1 g/dL (ref 12.0–15.0)
Immature Granulocytes: 0 %
Lymphocytes Relative: 24 %
Lymphs Abs: 0.9 10*3/uL (ref 0.7–4.0)
MCH: 30.4 pg (ref 26.0–34.0)
MCHC: 32.8 g/dL (ref 30.0–36.0)
MCV: 92.7 fL (ref 80.0–100.0)
Monocytes Absolute: 0.3 10*3/uL (ref 0.1–1.0)
Monocytes Relative: 7 %
Neutro Abs: 2.5 10*3/uL (ref 1.7–7.7)
Neutrophils Relative %: 67 %
Platelets: 98 10*3/uL — ABNORMAL LOW (ref 150–400)
RBC: 3.98 MIL/uL (ref 3.87–5.11)
RDW: 13.8 % (ref 11.5–15.5)
WBC: 3.8 10*3/uL — ABNORMAL LOW (ref 4.0–10.5)
nRBC: 0 % (ref 0.0–0.2)

## 2021-10-18 LAB — FERRITIN: Ferritin: 13 ng/mL (ref 11–307)

## 2021-10-18 LAB — IRON AND TIBC
Iron: 80 ug/dL (ref 28–170)
Saturation Ratios: 19 % (ref 10.4–31.8)
TIBC: 428 ug/dL (ref 250–450)
UIBC: 348 ug/dL

## 2021-10-24 NOTE — Progress Notes (Signed)
[address], [place] [address]   CLINIC:  Medical Oncology/Hematology  PCP:  Chevis Pretty, [place] [id number] [phone]   REASON FOR VISIT:  Follow-up for thrombocytopenia (liver cirrhosis) and iron deficiency anemia  PRIOR THERAPY: None  CURRENT THERAPY: Observation  INTERVAL HISTORY:  Ms. Forrey 71 y.o. female returns for routine follow-up of thrombocytopenia.  She was last seen by Tarri Abernethy PA-C on 07/19/2021.  Since her last visit, she was seen by gastroenterology and diagnosed with cirrhosis secondary to NASH.  She denies any other changes in her health status or new diagnoses since her last visit.  At today's visit, she reports feeling fairly well. She had an episode of hemorrhoidal bleeding about a month ago due to constipation caused by the iron pill.  She has not had any other episodes of hematochezia, melena, epistaxis, or hematemesis.  She denies any easy bruising or petechial rash.  She has not had any B symptoms such as fever, chills, night sweats, or unintentional weight loss.  She reports that her energy level has been better ever since she started taking B12 supplement.  She did try taking ferrous sulfate, but was unable to tolerate due to constipation and stomach cramps.  Instead, she has been trying to eat iron rich foods.  She denies any pica, severe headaches, or restless legs.  Since being diagnosed with fatty liver disease, the patient has been working hard to lose weight using both diet and exercise.  She enjoys doing water aerobics, and has lost over 10 pounds since her last visit.  She has 100% energy and 100% appetite.    REVIEW OF SYSTEMS:  Review of Systems  Constitutional:  Negative for appetite change, chills, diaphoresis, fatigue, fever and unexpected weight change.  HENT:   Negative for lump/mass and nosebleeds.   Eyes:  Negative for eye problems.  Respiratory:   Negative for cough, hemoptysis and shortness of breath.   Cardiovascular:  Negative for chest pain, leg swelling and palpitations.  Gastrointestinal:  Negative for abdominal pain, blood in stool, constipation, diarrhea, nausea and vomiting.  Genitourinary:  Negative for hematuria.   Skin: Negative.   Neurological:  Positive for headaches. Negative for dizziness and light-headedness.  Hematological:  Does not bruise/bleed easily.  Psychiatric/Behavioral:  The patient is nervous/anxious.      PAST MEDICAL/SURGICAL HISTORY:  Past Medical History:  Diagnosis Date   Arthritis    Chronic anxiety    GERD (gastroesophageal reflux disease)    H/O degenerative disc disease    Hypertension    Osteopenia 2021   Plantar fasciitis    Temporary low platelet count ([place]) 2022   94K then up to 104 in May of 2022   Thyroid disease    Past Surgical History:  Procedure Laterality Date   bladder tacked  1984   COLONOSCOPY  2011   1- TA   UPPER GASTROINTESTINAL ENDOSCOPY  2012   at [facility], normal     SOCIAL HISTORY:  Social History   Socioeconomic History   Marital status: Married    Spouse name: Laverna Peace   Number of children: 2   Years of education: [employer] education level: Bachelor's degree (e.g., BA, AB, BS)  Occupational History   Occupation: Retired   Tobacco Use   Smoking status: Never    Passive exposure: Past (husband usually smokes outside or in vehicle)   Smokeless tobacco: Never  Vaping Use  Vaping Use: Never used  Substance and Sexual Activity   Alcohol use: No   Drug use: No   Sexual activity: Not Currently  Other Topics Concern   Not on file  Social History Narrative   Not on file   Social Determinants of Health   Financial Resource Strain: Not on file  Food Insecurity: Not on file  Transportation Needs: Not on file  Physical Activity: Not on file  Stress: Not on file  Social Connections: Not on file  Intimate Partner Violence: Not on file     FAMILY HISTORY:  Family History  Problem Relation Age of Onset   Colon polyps Mother    Diabetes Mother    Pneumonia Father    Cancer Father        skin   Colon polyps Sister    Cancer Sister        skin   Colon cancer Neg Hx    Esophageal cancer Neg Hx    Rectal cancer Neg Hx    Stomach cancer Neg Hx     CURRENT MEDICATIONS:  Outpatient Encounter Medications as of 10/25/2021  Medication Sig   Cholecalciferol (VITAMIN D3) 2000 UNITS TABS Take 2,000 Units by mouth daily.    Cranberry 300 MG tablet Take 300 mg by mouth daily. Utiva supplement , cranberry extract 36 PAC   ferrous sulfate 325 (65 FE) MG EC tablet TAKE 1 TABLET DAILY WITH BREAKFAST AND GLASS OF ORANGE JUICE- TAKE 4 HRS APART FROM YOUR OMEPRAZOLE   ibuprofen (ADVIL,MOTRIN) 200 MG tablet Take 200 mg by mouth every 6 (six) hours as needed for mild pain.   levothyroxine (SYNTHROID) 100 MCG tablet Take 1 tablet (100 mcg total) by mouth daily.   lisinopril (ZESTRIL) 10 MG tablet Take 1 tablet (10 mg total) by mouth daily.   LORazepam (ATIVAN) 0.5 MG tablet Take 1 tablet (0.5 mg total) by mouth 2 (two) times daily as needed for anxiety.   omeprazole (PRILOSEC) 20 MG capsule TAKE 1 CAPSULE BY MOUTH EVERY DAY   ondansetron (ZOFRAN) 4 MG tablet Take 1 tablet (4 mg total) by mouth 2 (two) times daily as needed for nausea or vomiting.   vitamin B-12 (CYANOCOBALAMIN) 1000 MCG tablet Take 1,000 mcg by mouth daily.   vitamin C (ASCORBIC ACID) 500 MG tablet Take 500 mg by mouth daily.   Facility-Administered Encounter Medications as of 10/25/2021  Medication   0.9 %  sodium chloride infusion    ALLERGIES:  Allergies  Allergen Reactions   Amoxicillin-Pot Clavulanate Other (See Comments)    Urinary retention   Cephalexin Nausea And Vomiting    Internal head pressure and nausea   Doxycycline Nausea And Vomiting    Gi and internal head pressure   Nitrofurantoin Nausea And Vomiting    Gi upset and internal head pressure    Ciprofloxacin Hcl Nausea Only and Other (See Comments)    Abdominal pain, anxiety, facial rash   Naprosyn [Naproxen]     gerd   Other Other (See Comments)    Family History of Reaction   Shingrix [Zoster Vac Recomb Adjuvanted]     Family History of Reaction   Sulfonamide Derivatives     REACTION: skin turns red   Xylocaine [Lidocaine Hcl]     Real red, hot and flushed all over. Pt reports the redness was from being nervous and wasn't actually a reaction.      PHYSICAL EXAM:  ECOG PERFORMANCE STATUS: 0 - Asymptomatic  There were no  vitals filed for this visit. There were no vitals filed for this visit. Physical Exam Constitutional:      Appearance: Normal appearance. She is obese.  HENT:     Head: Normocephalic and atraumatic.     Mouth/Throat:     Mouth: Mucous membranes are moist.  Eyes:     Extraocular Movements: Extraocular movements intact.     Pupils: Pupils are equal, round, and reactive to light.  Cardiovascular:     Rate and Rhythm: Normal rate and regular rhythm.     Pulses: Normal pulses.     Heart sounds: Normal heart sounds.  Pulmonary:     Effort: Pulmonary effort is normal.     Breath sounds: Normal breath sounds.  Abdominal:     General: Bowel sounds are normal.     Palpations: Abdomen is soft.     Tenderness: There is no abdominal tenderness.  Musculoskeletal:        General: No swelling.     Right lower leg: No edema.     Left lower leg: No edema.  Lymphadenopathy:     Cervical: No cervical adenopathy.  Skin:    General: Skin is warm and dry.  Neurological:     General: No focal deficit present.     Mental Status: She is alert and oriented to person, place, and time.  Psychiatric:        Mood and Affect: Mood normal.        Behavior: Behavior normal.     LABORATORY DATA:  I have reviewed the labs as listed.  CBC    Component Value Date/Time   WBC 3.8 (L) 10/18/2021 1024   RBC 3.98 10/18/2021 1024   HGB 12.1 10/18/2021 1024   HGB 13.0  06/06/2021 0909   HCT 36.9 10/18/2021 1024   HCT 37.6 06/06/2021 0909   PLT 98 (L) 10/18/2021 1024   PLT 79 (LL) 06/06/2021 0909   MCV 92.7 10/18/2021 1024   MCV 86 06/06/2021 0909   MCH 30.4 10/18/2021 1024   MCHC 32.8 10/18/2021 1024   RDW 13.8 10/18/2021 1024   RDW 12.4 06/06/2021 0909   LYMPHSABS 0.9 10/18/2021 1024   LYMPHSABS 1.0 06/06/2021 0909   MONOABS 0.3 10/18/2021 1024   EOSABS 0.0 10/18/2021 1024   EOSABS 0.0 06/06/2021 0909   BASOSABS 0.0 10/18/2021 1024   BASOSABS 0.0 06/06/2021 0909   CMP Latest Ref Rng & Units 08/09/2021 06/06/2021 01/09/2021  Glucose 70 - 99 mg/dL 95 100(H) 104(H)  BUN 6 - 23 mg/dL 8 7(L) 7(L)  Creatinine 0.40 - 1.20 mg/dL 0.68 0.77 0.82  Sodium 135 - 145 mEq/L 130(L) 131(L) 136  Potassium 3.5 - 5.1 mEq/L 4.1 4.6 4.2  Chloride 96 - 112 mEq/L 97 96 97  CO2 19 - 32 mEq/L $Remove'26 20 23  'NuafQJi$ Calcium 8.4 - 10.5 mg/dL 10.2 10.0 10.1  Total Protein 6.0 - 8.5 g/dL - 7.0 7.4  Total Bilirubin 0.0 - 1.2 mg/dL - 0.7 0.7  Alkaline Phos 44 - 121 IU/L - 205(H) 206(H)  AST 0 - 40 IU/L - 28 24  ALT 0 - 32 IU/L - 16 18    DIAGNOSTIC IMAGING:  I have independently reviewed the relevant imaging and discussed with the patient.  ASSESSMENT & PLAN: 1.  Thrombocytopenia & leukopenia - Review of past labs shows normal CBC in 2012, with onset of thrombocytopenia in 2018, which has been progressive since that time.  Most recent platelets (06/06/2021) were 79. - Abdominal ultrasound (2012)  showed fatty infiltration of the liver - Abdominal ultrasound (07/09/2021): Fatty liver with nodularity in liver surface suggesting possible cirrhosis; splenomegaly with 14 cm maximum diameter and splenic volume 690 mL - Diagnosis of cirrhosis likely secondary to NASH was confirmed by gastroenterology (December 2022) - Mild thrombocytopenia noted on pathology smear review (06/28/2021) - Other work-up of thrombocytopenia was unremarkable.  Negative H. pylori, hepatitis B, hepatitis C, HIV.   Nutritional panel unremarkable with normal B12, folate, methylmalonic acid, copper.  ANA and rheumatoid factor negative..  LDH normal. - No bleeding, bruising, petechial rash    - No unexplained fever, chills, night sweats, weight loss.  No new lumps or bumps.     - No lymphadenopathy or hepatosplenomegaly palpated on exam   - Most recent CBC (10/18/2021): Platelets 98, WBC 3.8 with normal differential - Differential diagnosis favors thrombocytopenia and leukopenia secondary to cirrhosis and splenic sequestration.  If persistent or unexplainable abnormalities in CBC, would consider bone marrow biopsy for further work-up. - PLAN: No indication for treatment at this time, but would consider further treatment if platelets were persistently less than 30. - Repeat CBC and RTC in 4 months.   2.  Iron deficiency without anemia - Iron panel (10/18/2021) shows ferritin 13, iron saturation 19%, and TIBC 428 - Most recent CBC (10/18/2021) shows normal Hgb 12.1 - No signs or symptoms of GI bleeding apart from occasional hemorrhoidal bleeding - She is scheduled for EGD/colonoscopy on 11/15/2021 -She was unable to tolerate ferrous sulfate due to constipation and abdominal cramping - PLAN: Patient will try FERROUS BISGLYCINATE to see if she tolerates it better than ferrous sulfate. - Repeat CBC and iron panel at follow-up visit in 4-6 months and consider IV iron if not improved.   3.   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  Radiology recommendations for follow-up renal sonogram in 6 months to assess stability.   - PLAN: Repeat renal ultrasound in 4 months (June 2023).  We will discuss results at follow-up visit.   4.  Other history - PMH: Osteoarthritis, hypothyroidism, hypertension, anxiety, GERD.   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 Fuller Plan of [facility]. - SOCIAL: Retired Pharmacist, hospital.  Lives with her husband.  No tobacco, alcohol, drugs. - FAMILY: Father with NASH.  Father and sister with melanoma.   PLAN SUMMARY & DISPOSITION: Renal ultrasound (follow-up of right kidney lesion) Labs and office visit in 4 to 6 months  All questions were answered. The patient knows to call the clinic with any problems, questions or concerns.  Medical decision making: Moderate  Time spent on visit: I spent 20 minutes counseling the patient face to face. The total time spent in the appointment was 30 minutes and more than 50% was on counseling.   Harriett Rush, PA-C  10/25/2021 10:12 PM

## 2021-10-25 ENCOUNTER — Inpatient Hospital Stay (HOSPITAL_COMMUNITY): Payer: Medicare PPO | Admitting: Physician Assistant

## 2021-10-25 ENCOUNTER — Other Ambulatory Visit: Payer: Self-pay

## 2021-10-25 VITALS — BP 152/74 | HR 82 | Temp 97.7°F | Resp 18 | Ht 63.78 in | Wt 193.3 lb

## 2021-10-25 DIAGNOSIS — K7581 Nonalcoholic steatohepatitis (NASH): Secondary | ICD-10-CM | POA: Diagnosis not present

## 2021-10-25 DIAGNOSIS — E611 Iron deficiency: Secondary | ICD-10-CM

## 2021-10-25 DIAGNOSIS — N289 Disorder of kidney and ureter, unspecified: Secondary | ICD-10-CM

## 2021-10-25 DIAGNOSIS — K219 Gastro-esophageal reflux disease without esophagitis: Secondary | ICD-10-CM | POA: Diagnosis not present

## 2021-10-25 DIAGNOSIS — M1990 Unspecified osteoarthritis, unspecified site: Secondary | ICD-10-CM | POA: Diagnosis not present

## 2021-10-25 DIAGNOSIS — D696 Thrombocytopenia, unspecified: Secondary | ICD-10-CM | POA: Diagnosis not present

## 2021-10-25 DIAGNOSIS — K7460 Unspecified cirrhosis of liver: Secondary | ICD-10-CM | POA: Diagnosis not present

## 2021-10-25 DIAGNOSIS — I10 Essential (primary) hypertension: Secondary | ICD-10-CM | POA: Diagnosis not present

## 2021-10-25 DIAGNOSIS — D509 Iron deficiency anemia, unspecified: Secondary | ICD-10-CM | POA: Diagnosis not present

## 2021-10-25 DIAGNOSIS — E039 Hypothyroidism, unspecified: Secondary | ICD-10-CM | POA: Diagnosis not present

## 2021-10-25 DIAGNOSIS — F419 Anxiety disorder, unspecified: Secondary | ICD-10-CM | POA: Diagnosis not present

## 2021-10-25 NOTE — Patient Instructions (Addendum)
[place] at [facility] Discharge Instructions  You were seen today by Tarri Abernethy PA-C for your LOW PLATELETS and LOW IRON.  Your platelets are stable.  We will continue to monitor this.  Your iron levels are still low.  I suggest trying a different type of iron - FERROUS BISGLYCINATE - which can be gentler on your digestive tract.  Eat a diet rich in iron.  LABS: Return in 5 months for repeat labs   FOLLOW-UP APPOINTMENT: Office visit after labs   Thank you for choosing [place] at [facility] to provide your oncology and hematology care.  To afford each patient quality time with our provider, please arrive at least 15 minutes before your scheduled appointment time.   If you have a lab appointment with the [facility] please come in thru the Main Entrance and check in at the main information desk.  You need to re-schedule your appointment should you arrive 10 or more minutes late.  We strive to give you quality time with our providers, and arriving late affects you and other patients whose appointments are after yours.  Also, if you no show three or more times for appointments you may be dismissed from the clinic at the providers discretion.     Again, thank you for choosing [facility]  Our hope is that these requests will decrease the amount of time that you wait before being seen by our physicians.       _____________________________________________________________  Should you have questions after your visit to [facility], please contact our office at [phone] and follow the prompts.  Our office hours are 8:00 a.m. and 4:30 p.m. Monday - Friday.  Please note that voicemails left after 4:00 p.m. may not be returned until the following business day.  We are closed weekends and major holidays.  You do have access to a nurse 24-7, just call the main number to the clinic [phone] and do not press  any options, hold on the line and a nurse will answer the phone.    For prescription refill requests, have your pharmacy contact our office and allow 72 hours.    Due to Covid, you will need to wear a mask upon entering the hospital. If you do not have a mask, a mask will be given to you at the Main Entrance upon arrival. For doctor visits, patients may have 1 support person age 35 or older with them. For treatment visits, patients can not have anyone with them due to social distancing guidelines and our immunocompromised population.

## 2021-10-29 ENCOUNTER — Ambulatory Visit (INDEPENDENT_AMBULATORY_CARE_PROVIDER_SITE_OTHER): Payer: Medicare PPO | Admitting: Gastroenterology

## 2021-10-29 DIAGNOSIS — Z23 Encounter for immunization: Secondary | ICD-10-CM

## 2021-11-05 ENCOUNTER — Encounter: Payer: Medicare PPO | Admitting: Gastroenterology

## 2021-11-07 DIAGNOSIS — B0089 Other herpesviral infection: Secondary | ICD-10-CM | POA: Diagnosis not present

## 2021-11-07 DIAGNOSIS — L820 Inflamed seborrheic keratosis: Secondary | ICD-10-CM | POA: Diagnosis not present

## 2021-11-12 ENCOUNTER — Encounter: Payer: Self-pay | Admitting: Gastroenterology

## 2021-11-15 ENCOUNTER — Encounter: Payer: Self-pay | Admitting: Gastroenterology

## 2021-11-15 ENCOUNTER — Other Ambulatory Visit: Payer: Self-pay | Admitting: Gastroenterology

## 2021-11-15 ENCOUNTER — Other Ambulatory Visit: Payer: Self-pay

## 2021-11-15 ENCOUNTER — Ambulatory Visit (AMBULATORY_SURGERY_CENTER): Payer: Medicare PPO | Admitting: Gastroenterology

## 2021-11-15 VITALS — BP 116/60 | HR 71 | Temp 96.4°F | Resp 14 | Ht 66.0 in | Wt 196.0 lb

## 2021-11-15 DIAGNOSIS — K766 Portal hypertension: Secondary | ICD-10-CM | POA: Diagnosis not present

## 2021-11-15 DIAGNOSIS — D122 Benign neoplasm of ascending colon: Secondary | ICD-10-CM

## 2021-11-15 DIAGNOSIS — K7460 Unspecified cirrhosis of liver: Secondary | ICD-10-CM | POA: Diagnosis not present

## 2021-11-15 DIAGNOSIS — K319 Disease of stomach and duodenum, unspecified: Secondary | ICD-10-CM

## 2021-11-15 DIAGNOSIS — D123 Benign neoplasm of transverse colon: Secondary | ICD-10-CM | POA: Diagnosis not present

## 2021-11-15 DIAGNOSIS — Z86010 Personal history of colonic polyps: Secondary | ICD-10-CM

## 2021-11-15 DIAGNOSIS — I10 Essential (primary) hypertension: Secondary | ICD-10-CM | POA: Diagnosis not present

## 2021-11-15 MED ORDER — SODIUM CHLORIDE 0.9 % IV SOLN: 500.0000 mL | Freq: Once | INTRAVENOUS | Status: AC

## 2021-11-15 NOTE — Op Note (Signed)
[facility] ?Patient Name: Kelly Lewis ?Procedure Date: 11/15/2021 8:25 AM ?MRN: [id number] ?Endoscopist: Ladene Artist , MD ?Age: 71 ?Referring MD:  ?Date of Birth: 1951/04/15 ?Gender: Female ?Account #: [id number] ?Procedure:                Colonoscopy ?Indications:              Surveillance: Piecemeal removal of large sessile  ?                          adenoma last colonoscopy (< 3 yrs) ?Medicines:                Monitored Anesthesia Care ?Procedure:                Pre-Anesthesia Assessment: ?                          - Prior to the procedure, a History and Physical  ?                          was performed, and patient medications and  ?                          allergies were reviewed. The patient's tolerance of  ?                          previous anesthesia was also reviewed. The risks  ?                          and benefits of the procedure and the sedation  ?                          options and risks were discussed with the patient.  ?                          All questions were answered, and informed consent  ?                          was obtained. Prior Anticoagulants: The patient has  ?                          taken no previous anticoagulant or antiplatelet  ?                          agents. ASA Grade Assessment: III - A patient with  ?                          severe systemic disease. After reviewing the risks  ?                          and benefits, the patient was deemed in  ?                          satisfactory condition to undergo the procedure. ?  After obtaining informed consent, the colonoscope  ?                          was passed under direct vision. Throughout the  ?                          procedure, the patient's blood pressure, pulse, and  ?                          oxygen saturations were monitored continuously. The  ?                          CF [id number] #[id number] was introduced through the anus  ?                          and advanced to the the  cecum, identified by  ?                          appendiceal orifice and ileocecal valve. The  ?                          ileocecal valve, appendiceal orifice, and rectum  ?                          were photographed. The quality of the bowel  ?                          preparation was adequate after substantial lavage,  ?                          suction. The colonoscopy was performed without  ?                          difficulty. The patient tolerated the procedure  ?                          well. ?Scope In: 8:31:25 AM ?Scope Out: 8:53:48 AM ?Scope Withdrawal Time: 0 hours 20 minutes 7 seconds  ?Total Procedure Duration: 0 hours 22 minutes 23 seconds  ?Findings:                 The perianal and digital rectal examinations were  ?                          normal. ?                          A 8 mm polyp was found in the ascending colon at  ?                          the tattoo and prior piecemeal polypectomy site  ?                          which was mildly scarred. The polyp was sessile.  ?  The polyp was removed with a piecemeal technique  ?                          using a hot snare. Resection and retrieval were  ?                          complete. ?                          A tattoo was seen in the ascending colon. The  ?                          tattoo site appeared abnormal with residual polyp  ?                          and scarring. ?                          Two sessile polyps were found in the transverse  ?                          colon and ascending colon. The polyps were 7 mm in  ?                          size. These polyps were removed with a cold snare.  ?                          Resection and retrieval were complete. ?                          Multiple small-mouthed diverticula were found in  ?                          the left colon. There was no evidence of  ?                          diverticular bleeding. ?                          External hemorrhoids were found  during  ?                          retroflexion. The hemorrhoids were moderate. ?                          The exam was otherwise without abnormality on  ?                          direct and retroflexion views. ?Complications:            No immediate complications. Estimated blood loss:  ?                          None. ?Estimated Blood Loss:     Estimated blood loss: none. ?Impression:               -  One 8 mm polyp in the ascending colon at the  ?                          prior piecemeal polypectomy site, removed piecemeal  ?                          using a hot snare. Resected and retrieved. ?                          - A tattoo was seen in the ascending colon. The  ?                          tattoo site appeared abnormal with residual polyp  ?                          and scarring. ?                          - Two 7 mm polyps in the transverse colon and in  ?                          the ascending colon, removed with a cold snare.  ?                          Resected and retrieved. ?                          - Mild diverticulosis in the left colon. ?                          - External hemorrhoids. ?                          - The examination was otherwise normal on direct  ?                          and retroflexion views. ?Recommendation:           - Repeat colonoscopy date to be determined, likely  ?                          3 years, after pending pathology results are  ?                          reviewed for surveillance based on pathology  ?                          results. ?                          - Patient has a contact number available for  ?                          emergencies. The signs and symptoms of potential  ?  delayed complications were discussed with the  ?                          patient. Return to normal activities tomorrow.  ?                          Written discharge instructions were provided to the  ?                          patient. ?                           - Resume previous diet. ?                          - Continue present medications. ?                          - Await pathology results. ?                          - No aspirin, ibuprofen, naproxen, or other  ?                          non-steroidal anti-inflammatory drugs for 2 weeks  ?                          after polyp removal. ?Ladene Artist, MD ?11/15/2021 9:08:50 AM ?This report has been signed electronically. ?

## 2021-11-15 NOTE — Progress Notes (Signed)
Report to PACU, RN, vss, BBS= Clear.  

## 2021-11-15 NOTE — Op Note (Signed)
[facility] ?Patient Name: Kelly Lewis ?Procedure Date: 11/15/2021 8:21 AM ?MRN: [id number] ?Endoscopist: Ladene Artist , MD ?Age: 71 ?Referring MD:  ?Date of Birth: 1951/05/15 ?Gender: Female ?Account #: [id number] ?Procedure:                Upper GI endoscopy ?Indications:              Screening procedure, Cirrhosis screen for varices. ?Medicines:                Monitored Anesthesia Care ?Procedure:                Pre-Anesthesia Assessment: ?                          - Prior to the procedure, a History and Physical  ?                          was performed, and patient medications and  ?                          allergies were reviewed. The patient's tolerance of  ?                          previous anesthesia was also reviewed. The risks  ?                          and benefits of the procedure and the sedation  ?                          options and risks were discussed with the patient.  ?                          All questions were answered, and informed consent  ?                          was obtained. Prior Anticoagulants: The patient has  ?                          taken no previous anticoagulant or antiplatelet  ?                          agents. ASA Grade Assessment: III - A patient with  ?                          severe systemic disease. After reviewing the risks  ?                          and benefits, the patient was deemed in  ?                          satisfactory condition to undergo the procedure. ?                          After obtaining informed consent, the endoscope was  ?  passed under direct vision. Throughout the  ?                          procedure, the patient's blood pressure, pulse, and  ?                          oxygen saturations were monitored continuously. The  ?                          Endoscope was introduced through the mouth, and  ?                          advanced to the second part of duodenum. The upper  ?                          GI  endoscopy was accomplished without difficulty.  ?                          The patient tolerated the procedure well. ?Scope In: ?Scope Out: ?Findings:                 The examined esophagus was normal. ?                          Mild portal hypertensive gastropathy was found in  ?                          the entire examined stomach. ?                          The exam of the stomach was otherwise normal. ?                          The duodenal bulb and second portion of the  ?                          duodenum were normal. ?Complications:            No immediate complications. ?Estimated Blood Loss:     Estimated blood loss: none. ?Impression:               - Normal esophagus. ?                          - Portal hypertensive gastropathy. ?                          - Normal duodenal bulb and second portion of the  ?                          duodenum. ?                          - No specimens collected. ?Recommendation:           - Patient has a contact number available for  ?  emergencies. The signs and symptoms of potential  ?                          delayed complications were discussed with the  ?                          patient. Return to normal activities tomorrow.  ?                          Written discharge instructions were provided to the  ?                          patient. ?                          - Resume previous diet. ?                          - Continue present medications. ?                          - Repeat upper endoscopy in 3 years for screening  ?                          purposes. ?Ladene Artist, MD ?11/15/2021 9:11:53 AM ?This report has been signed electronically. ?

## 2021-11-15 NOTE — Progress Notes (Signed)
Called to room to assist during endoscopic procedure.  Patient ID and intended procedure confirmed with present staff. Received instructions for my participation in the procedure from the performing physician.  

## 2021-11-15 NOTE — Progress Notes (Signed)
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   Hepatic serologic evaluation was unrevealing.  CT AP findings were reviewed.  In October she started standard dietary measures for hepatic steatosis including a weight loss diet.  Her weight has decreased from 209 in October to 196. ?  ?  ?CT AP 09/07/2021 ?1. Cirrhotic changes involving the liver with portal venous hypertension, portal venous collaterals and splenomegaly. No ascites. ?2. No hepatic lesions to suggest hepatoma. ?3. No acute abdominal/pelvic findings, mass lesions or lymphadenopathy. ?4. 3.3 cm simple appearing left ovarian cyst. Recommend follow-up pelvic ultrasound examination in 6-12 months. ?5. Moderate cystocele. ?6. Aortic atherosclerosis. ?  ?  ?Current Medications, Allergies, Past Medical History, Past Surgical History, Family History and Social History were reviewed in [employer] record. ?  ?  ?Physical Exam: ?General: Well developed, well nourished, no acute distress ?Head: Normocephalic and atraumatic ?Eyes: Sclerae anicteric, EOMI ?Ears: Normal auditory acuity ?Mouth: Not examined, mask on during Covid-19 pandemic ?Extremities: No clubbing, cyanosis, edema or deformities noted ?Neurological: Alert oriented x 4, grossly nonfocal ?Psychological:  Alert and cooperative. Normal mood and affect ?  ?  ?Assessment and Recommendations: ?  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 EGD to screen for varices.  ?Adenomatous colon polyp, 2.5 cm, post piecemeal removal in September 2022.  Colonoscopy to assess piecemeal polypectomy site.  ?

## 2021-11-15 NOTE — Patient Instructions (Signed)
Discharge instructions given. ?Handouts on polyps,diverticulosis and hemorrhoids. ?No aspirin,ibuprofen,naproxen, or other non-steroidal anti-inflammatory drugs for 2 weeks. ?Resume previous medications. ?YOU HAD AN ENDOSCOPIC PROCEDURE TODAY AT [facility]:   Refer to the procedure report that was given to you for any specific questions about what was found during the examination.  If the procedure report does not answer your questions, please call your gastroenterologist to clarify.  If you requested that your care partner not be given the details of your procedure findings, then the procedure report has been included in a sealed envelope for you to review at your convenience later. ? ?YOU SHOULD EXPECT: Some feelings of bloating in the abdomen. Passage of more gas than usual.  Walking can help get rid of the air that was put into your GI tract during the procedure and reduce the bloating. If you had a lower endoscopy (such as a colonoscopy or flexible sigmoidoscopy) you may notice spotting of blood in your stool or on the toilet paper. If you underwent a bowel prep for your procedure, you may not have a normal bowel movement for a few days. ? ?Please Note:  You might notice some irritation and congestion in your nose or some drainage.  This is from the oxygen used during your procedure.  There is no need for concern and it should clear up in a day or so. ? ?SYMPTOMS TO REPORT IMMEDIATELY: ? ?Following lower endoscopy (colonoscopy or flexible sigmoidoscopy): ? Excessive amounts of blood in the stool ? Significant tenderness or worsening of abdominal pains ? Swelling of the abdomen that is new, acute ? Fever of 100?F or higher ? ?Following upper endoscopy (EGD) ? Vomiting of blood or coffee ground material ? New chest pain or pain under the shoulder blades ? Painful or persistently difficult swallowing ? New shortness of breath ? Fever of 100?F or higher ? Black, tarry-looking stools ? ?For urgent  or emergent issues, a gastroenterologist can be reached at any hour by calling [phone]. ?Do not use MyChart messaging for urgent concerns.  ? ? ?DIET:  We do recommend a small meal at first, but then you may proceed to your regular diet.  Drink plenty of fluids but you should avoid alcoholic beverages for 24 hours. ? ?ACTIVITY:  You should plan to take it easy for the rest of today and you should NOT DRIVE or use heavy machinery until tomorrow (because of the sedation medicines used during the test).   ? ?FOLLOW UP: ?Our staff will call the number listed on your records 48-72 hours following your procedure to check on you and address any questions or concerns that you may have regarding the information given to you following your procedure. If we do not reach you, we will leave a message.  We will attempt to reach you two times.  During this call, we will ask if you have developed any symptoms of COVID 19. If you develop any symptoms (ie: fever, flu-like symptoms, shortness of breath, cough etc.) before then, please call [phone].  If you test positive for Covid 19 in the 2 weeks post procedure, please call and report this information to [place].   ? ?If any biopsies were taken you will be contacted by phone or by letter within the next 1-3 weeks.  Please call us at [phone] if you have not heard about the biopsies in 3 weeks.  ? ? ?SIGNATURES/CONFIDENTIALITY: ?You and/or your care partner have signed paperwork which will be  entered into your electronic medical record.  These signatures attest to the fact that that the information above on your After Visit Summary has been reviewed and is understood.  Full responsibility of the confidentiality of this discharge information lies with you and/or your care-partner.  ?

## 2021-11-15 NOTE — Progress Notes (Signed)
[facility] 

## 2021-11-19 ENCOUNTER — Telehealth: Payer: Self-pay

## 2021-11-19 NOTE — Telephone Encounter (Signed)
?  Follow up Call- ? ?Call back number 11/15/2021 05/09/2021  ?Post procedure Call Back phone  # [phone]  ?Permission to leave phone message Yes Yes  ?Some recent data might be hidden  ?  ? ?Patient questions: ? ?Do you have a fever, pain , or abdominal swelling? No. ?Pain Score  0 * ? ?Have you tolerated food without any problems? Yes.   ? ?Have you been able to return to your normal activities? Yes.   ? ?Do you have any questions about your discharge instructions: ?Diet   No. ?Medications  No. ?Follow up visit  No. ? ?Do you have questions or concerns about your Care? No. ? ?Actions: ?* If pain score is 4 or above: ?No action needed, pain <4. ? ? ?

## 2021-12-03 ENCOUNTER — Encounter: Payer: Self-pay | Admitting: Gastroenterology

## 2021-12-06 ENCOUNTER — Ambulatory Visit: Payer: Medicare PPO | Admitting: Nurse Practitioner

## 2021-12-06 ENCOUNTER — Encounter: Payer: Self-pay | Admitting: Nurse Practitioner

## 2021-12-06 VITALS — BP 132/69 | HR 78 | Temp 97.1°F | Resp 20 | Ht 66.0 in | Wt 194.0 lb

## 2021-12-06 DIAGNOSIS — Z6831 Body mass index (BMI) 31.0-31.9, adult: Secondary | ICD-10-CM

## 2021-12-06 DIAGNOSIS — I10 Essential (primary) hypertension: Secondary | ICD-10-CM

## 2021-12-06 DIAGNOSIS — D696 Thrombocytopenia, unspecified: Secondary | ICD-10-CM | POA: Diagnosis not present

## 2021-12-06 DIAGNOSIS — F411 Generalized anxiety disorder: Secondary | ICD-10-CM

## 2021-12-06 DIAGNOSIS — E039 Hypothyroidism, unspecified: Secondary | ICD-10-CM

## 2021-12-06 DIAGNOSIS — K219 Gastro-esophageal reflux disease without esophagitis: Secondary | ICD-10-CM

## 2021-12-06 MED ORDER — OMEPRAZOLE 20 MG PO CPDR: DELAYED_RELEASE_CAPSULE | ORAL | 1 refills | Status: DC

## 2021-12-06 MED ORDER — LISINOPRIL 10 MG PO TABS: 10.0000 mg | ORAL_TABLET | Freq: Every day | ORAL | 1 refills | Status: DC

## 2021-12-06 MED ORDER — LEVOTHYROXINE SODIUM 100 MCG PO TABS: 100.0000 ug | ORAL_TABLET | Freq: Every day | ORAL | 1 refills | Status: DC

## 2021-12-06 MED ORDER — LORAZEPAM 0.5 MG PO TABS: 0.5000 mg | ORAL_TABLET | Freq: Two times a day (BID) | ORAL | 5 refills | Status: DC | PRN

## 2021-12-06 NOTE — Progress Notes (Signed)
? ?Subjective:  ? ? Patient ID: Kelly Lewis, female    DOB: 07/09/1951, 71 y.o.   MRN: [id number] ? ?Chief Complaint: medical management of chronic issues  ?  ? ?HPI: ? ?Kelly Lewis is a 71 y.o. who identifies as a female who was assigned female at birth.  ? ?Social history: ?Lives with: husband ?Work history: retired [employer] ? ? ?Comes in today for follow up of the following chronic medical issues: ? ?1. Primary hypertension ?No c/o chest pain, sob or headache. Does  check blood pressure at home and is running around 007'H systolic. ?BP Readings from Last 3 Encounters:  ?11/15/21 116/60  ?10/25/21 (!) 152/74  ?09/19/21 (!) 158/82  ? ? ? ?2. Gastroesophageal reflux disease without esophagitis ?Is on omeprazole daily and is doing well ? ?3. Thrombocytopenia ([place]) ?Lab Results  ?Component Value Date  ? PLT 98 (L) 10/18/2021  ? ? ? ?4. GAD (generalized anxiety disorder) ?Is on ativan bid. Has a tendency to get very anxious ? ?  12/06/2021  ?  8:21 AM 06/06/2021  ?  8:31 AM 12/04/2020  ? 10:54 AM 06/05/2020  ? 12:30 PM  ?GAD 7 : Generalized Anxiety Score  ?Nervous, Anxious, on Edge $Remov'1 1 1 'KOnDag$ 0  ?Control/stop worrying 1 0 1 0  ?Worry too much - different things 0 0 1 0  ?Trouble relaxing 0 0 1 0  ?Restless 0 0 0 0  ?Easily annoyed or irritable 0 0 0 0  ?Afraid - awful might happen 0 0 0 0  ?Total GAD 7 Score $Remov'2 1 4 'hRvvVw$ 0  ?Anxiety Difficulty Not difficult at all Not difficult at all Not difficult at all Not difficult at all  ? ? ? ? ?5. BMI 34.0-34.9,adult ?No recent weight changes ?Wt Readings from Last 3 Encounters:  ?12/06/21 194 lb (88 kg)  ?11/15/21 196 lb (88.9 kg)  ?10/25/21 193 lb 5.5 oz (87.7 kg)  ? ?BMI Readings from Last 3 Encounters:  ?12/06/21 31.31 kg/m?  ?11/15/21 31.64 kg/m?  ?10/25/21 33.42 kg/m?  ? ? ? ?New complaints: ?None today ? ?Allergies  ?Allergen Reactions  ? Amoxicillin-Pot Clavulanate Other (See Comments)  ?  Urinary retention  ? Cephalexin Nausea And Vomiting  ?  Internal head pressure and nausea  ?  Doxycycline Nausea And Vomiting  ?  Gi and internal head pressure  ? Nitrofurantoin Nausea And Vomiting  ?  Gi upset and internal head pressure  ? Ciprofloxacin Hcl Nausea Only and Other (See Comments)  ?  Abdominal pain, anxiety, facial rash  ? Naprosyn [Naproxen]   ?  gerd  ? Other Other (See Comments)  ?  Family History of Reaction  ? Shingrix [Zoster Vac Recomb Adjuvanted]   ?  Family History of Reaction  ? Sulfonamide Derivatives   ?  REACTION: skin turns red  ? Xylocaine [Lidocaine Hcl]   ?  Real red, hot and flushed all over. Pt reports the redness was from being nervous and wasn't actually a reaction.   ? ?Outpatient Encounter Medications as of 12/06/2021  ?Medication Sig  ? Cholecalciferol (VITAMIN D3) 2000 UNITS TABS Take 2,000 Units by mouth daily.   ? Cranberry 300 MG tablet Take 300 mg by mouth daily. Utiva supplement , cranberry extract 36 PAC  ? ibuprofen (ADVIL,MOTRIN) 200 MG tablet Take 200 mg by mouth every 6 (six) hours as needed for mild pain.  ? levothyroxine (SYNTHROID) 100 MCG tablet Take 1 tablet (100 mcg total) by mouth daily.  ?  lisinopril (ZESTRIL) 10 MG tablet Take 1 tablet (10 mg total) by mouth daily. (Patient not taking: Reported on 11/15/2021)  ? LORazepam (ATIVAN) 0.5 MG tablet Take 1 tablet (0.5 mg total) by mouth 2 (two) times daily as needed for anxiety.  ? omeprazole (PRILOSEC) 20 MG capsule TAKE 1 CAPSULE BY MOUTH EVERY DAY  ? ondansetron (ZOFRAN) 4 MG tablet Take 1 tablet (4 mg total) by mouth 2 (two) times daily as needed for nausea or vomiting.  ? Probiotic Product (ALIGN) 4 MG CAPS Take by mouth.  ? vitamin B-12 (CYANOCOBALAMIN) 1000 MCG tablet Take 1,000 mcg by mouth daily.  ? vitamin C (ASCORBIC ACID) 500 MG tablet Take 500 mg by mouth daily.  ? ?Facility-Administered Encounter Medications as of 12/06/2021  ?Medication  ? 0.9 %  sodium chloride infusion  ? ? ?Past Surgical History:  ?Procedure Laterality Date  ? bladder tacked  1984  ? COLONOSCOPY  2011  ? 1- TA  ? UPPER  GASTROINTESTINAL ENDOSCOPY  2012  ? at [place], normal  ? ? ?Family History  ?Problem Relation Age of Onset  ? Colon polyps Mother   ? Diabetes Mother   ? Pneumonia Father   ? Cancer Father   ?     skin  ? Colon polyps Sister   ? Cancer Sister   ?     skin  ? Colon cancer Neg Hx   ? Esophageal cancer Neg Hx   ? Rectal cancer Neg Hx   ? Stomach cancer Neg Hx   ? ? ? ? ?Controlled substance contract: n/a ? ? ? ? ?Review of Systems  ?Constitutional:  Negative for diaphoresis.  ?Eyes:  Negative for pain.  ?Respiratory:  Negative for shortness of breath.   ?Cardiovascular:  Negative for chest pain, palpitations and leg swelling.  ?Gastrointestinal:  Negative for abdominal pain.  ?Endocrine: Negative for polydipsia.  ?Skin:  Negative for rash.  ?Neurological:  Negative for dizziness, weakness and headaches.  ?Hematological:  Does not bruise/bleed easily.  ?All other systems reviewed and are negative. ? ?   ?Objective:  ? Physical Exam ?Vitals and nursing note reviewed.  ?Constitutional:   ?   General: She is not in acute distress. ?   Appearance: Normal appearance. She is well-developed.  ?HENT:  ?   Head: Normocephalic.  ?   Right Ear: Tympanic membrane normal.  ?   Left Ear: Tympanic membrane normal.  ?   Nose: Nose normal.  ?   Mouth/Throat:  ?   Mouth: Mucous membranes are moist.  ?Eyes:  ?   Pupils: Pupils are equal, round, and reactive to light.  ?Neck:  ?   Vascular: No carotid bruit or JVD.  ?Cardiovascular:  ?   Rate and Rhythm: Normal rate and regular rhythm.  ?   Heart sounds: Normal heart sounds.  ?Pulmonary:  ?   Effort: Pulmonary effort is normal. No respiratory distress.  ?   Breath sounds: Normal breath sounds. No wheezing or rales.  ?Chest:  ?   Chest wall: No tenderness.  ?Abdominal:  ?   General: Bowel sounds are normal. There is no distension or abdominal bruit.  ?   Palpations: Abdomen is soft. There is no hepatomegaly, splenomegaly, mass or pulsatile mass.  ?   Tenderness: There is no abdominal  tenderness.  ?Musculoskeletal:     ?   General: Normal range of motion.  ?   Cervical back: Normal range of motion and neck supple.  ?Lymphadenopathy:  ?  Cervical: No cervical adenopathy.  ?Skin: ?   General: Skin is warm and dry.  ?Neurological:  ?   Mental Status: She is alert and oriented to person, place, and time.  ?   Deep Tendon Reflexes: Reflexes are normal and symmetric.  ?Psychiatric:     ?   Behavior: Behavior normal.     ?   Thought Content: Thought content normal.     ?   Judgment: Judgment normal.  ? ? ?BP 132/69   Pulse 78   Temp (!) 97.1 ?F (36.2 ?C) (Temporal)   Resp 20   Ht $R'5\' 6"'eA$  (1.676 m)   Wt 194 lb (88 kg)   SpO2 99%   BMI 31.31 kg/m?  ? ? ? ?   ?Assessment & Plan:  ? ?Joni Reining comes in today with chief complaint of Medical Management of Chronic Issues ? ? ?Diagnosis and orders addressed: ? ?1. Primary hypertension ?Low sodium diet ?- lisinopril (ZESTRIL) 10 MG tablet; Take 1 tablet (10 mg total) by mouth daily.  Dispense: 90 tablet; Refill: 1 ? ?2. Gastroesophageal reflux disease without esophagitis ?Avoid spicy foods ?Do not eat 2 hours prior to bedtime ?- omeprazole (PRILOSEC) 20 MG capsule; TAKE 1 CAPSULE BY MOUTH EVERY DAY  Dispense: 90 capsule; Refill: 1 ? ?3. Thrombocytopenia ([place]) ?Labs pending ? ?4. GAD (generalized anxiety disorder) ?Stress management ?- LORazepam (ATIVAN) 0.5 MG tablet; Take 1 tablet (0.5 mg total) by mouth 2 (two) times daily as needed for anxiety.  Dispense: 60 tablet; Refill: 5 ? ?5. BMI 34.0-34.9,adult ?Discussed diet and exercise for person with BMI >25 ?Will recheck weight in 3-6 months ? ? ?6. Acquired hypothyroidism ?Labs pending ?- levothyroxine (SYNTHROID) 100 MCG tablet; Take 1 tablet (100 mcg total) by mouth daily.  Dispense: 90 tablet; Refill: 1 ? ?Orders Placed This Encounter  ?Procedures  ? CBC with Differential/Platelet  ? CMP14+EGFR  ? Lipid panel  ? Thyroid Panel With TSH  ? ? ?Labs pending ?Health Maintenance reviewed ?Diet and  exercise encouraged ? ?Follow up plan: ?6 months ? ? ?Mary-Margaret Hassell Done, FNP ? ?

## 2021-12-06 NOTE — Patient Instructions (Signed)
Stress, Adult °Stress is a normal reaction to life events. Stress is what you feel when life demands more than you are used to, or more than you think you can handle. °Some stress can be useful, such as studying for a test or meeting a deadline at work. Stress that occurs too often or for too long can cause problems. Long-lasting stress is called chronic stress. Chronic stress can affect your emotional health and interfere with relationships and normal daily activities. °Too much stress can weaken your body's defense system (immune system) and increase your risk for physical illness. If you already have a medical problem, stress can make it worse. °What are the causes? °All sorts of life events can cause stress. An event that causes stress for one person may not be stressful for someone else. Major life events, whether positive or negative, commonly cause stress. Examples include: °Losing a job or starting a new job. °Losing a loved one. °Moving to a new town or home. °Getting married or divorced. °Having a baby. °Getting injured or sick. °Less obvious life events can also cause stress, especially if they occur day after day or in combination with each other. Examples include: °Working long hours. °Driving in traffic. °Caring for children. °Being in debt. °Being in a difficult relationship. °What are the signs or symptoms? °Stress can cause emotional and physical symptoms and can lead to unhealthy behaviors. These include the following: °Emotional symptoms °Anxiety. This is feeling worried, afraid, on edge, overwhelmed, or out of control. °Anger, including irritation or impatience. °Depression. This is feeling sad, down, helpless, or guilty. °Trouble focusing, remembering, or making decisions. °Physical symptoms °Aches and pains. These may affect your head, neck, back, stomach, or other areas of your body. °Tight muscles or a clenched jaw. °Low energy. °Trouble sleeping. °Unhealthy behaviors °Eating to feel better  (overeating) or skipping meals. °Working too much or putting off tasks. °Smoking, drinking alcohol, or using drugs to feel better. °How is this diagnosed? °A stress disorder is diagnosed through an assessment by your health care provider. A stress disorder may be diagnosed based on: °Your symptoms and any stressful life events. °Your medical history. °Tests to rule out other causes of your symptoms. °Depending on your condition, your health care provider may refer you to a specialist for further evaluation. °How is this treated? °Stress management techniques are the recommended treatment for stress. Medicine is not typically recommended for treating stress. °Techniques to reduce your reaction to stressful life events include: °Identifying stress. Monitor yourself for symptoms of stress and notice what causes stress for you. These skills may help you to avoid or prepare for stressful events. °Managing time. Set your priorities, keep a calendar of events, and learn to say no. These actions can help you avoid taking on too much. °Techniques for dealing with stress include: °Rethinking the problem. Try to think realistically about stressful events rather than ignoring them or overreacting. Try to find the positives in a stressful situation rather than focusing on the negatives. °Exercise. Physical exercise can release both physical and emotional tension. The key is to find a form of exercise that you enjoy and do it regularly. °Relaxation techniques. These relax the body and mind. Find one or more that you enjoy and use the techniques regularly. Examples include: °Meditation, deep breathing, or progressive relaxation techniques. °Yoga or tai chi. °Biofeedback, mindfulness techniques, or journaling. °Listening to music, being in nature, or taking part in other hobbies. °Practicing a healthy lifestyle. Eat a balanced diet,   drink plenty of water, limit or avoid caffeine, and get plenty of sleep. ?Having a strong support  network. Spend time with family, friends, or other people you enjoy being around. Express your feelings and talk things over with someone you trust. ?Counseling or talk therapy with a mental health provider may help if you are having trouble managing stress by yourself. ?Follow these instructions at home: ?Lifestyle ? ?Avoid drugs. ?Do not use any products that contain nicotine or tobacco. These products include cigarettes, chewing tobacco, and vaping devices, such as e-cigarettes. If you need help quitting, ask your health care provider. ?If you drink alcohol: ?Limit how much you have to: ?0-1 drink a day for women who are not pregnant. ?0-2 drinks a day for men. ?Know how much alcohol is in a drink. In the [place] one drink equals one 12 oz bottle of beer (355 mL), one 5 oz glass of wine (148 mL), or one 1? oz glass of hard liquor (44 mL). ?Do not use alcohol or drugs to relax. ?Eat a balanced diet that includes fresh fruits and vegetables, whole grains, lean meats, fish, eggs, beans, and low-fat dairy. Avoid processed foods and foods high in added fat, sugar, and salt. ?Exercise at least 30 minutes on 5 or more days each week. ?Get 7-8 hours of sleep each night. ?General instructions ? ?Practice stress management techniques as told by your health care provider. ?Drink enough fluid to keep your urine pale yellow. ?Take over-the-counter and prescription medicines only as told by your health care provider. ?Keep all follow-up visits. This is important. ?Contact a health care provider if: ?Your symptoms get worse. ?You have new symptoms. ?You feel overwhelmed by your problems and can no longer manage them by yourself. ?Get help right away if: ?You have thoughts of hurting yourself or others. ?Get help right awayif you feel like you may hurt yourself or others, or have thoughts about taking your own life. Go to your nearest emergency room or: ?Call 911. ?Call the [facility] at [phone] or  988. This is open 24 hours a day. ?Text the Crisis Text Line at (385)109-9590. ?Summary ?Stress is a normal reaction to life events. It can cause problems if it happens too often or for too long. ?Practicing stress management techniques is the best way to treat stress. ?Counseling or talk therapy with a mental health provider may help if you are having trouble managing stress by yourself. ?This information is not intended to replace advice given to you by your health care provider. Make sure you discuss any questions you have with your health care provider. ?Document Revised: 03/29/2021 Document Reviewed: 03/29/2021 ?Elsevier Patient Education ? [place]. ? ?

## 2021-12-07 LAB — CBC WITH DIFFERENTIAL/PLATELET
Basophils Absolute: 0 10*3/uL (ref 0.0–0.2)
Basos: 0 %
EOS (ABSOLUTE): 0 10*3/uL (ref 0.0–0.4)
Eos: 1 %
Hematocrit: 37 % (ref 34.0–46.6)
Hemoglobin: 12.8 g/dL (ref 11.1–15.9)
Immature Grans (Abs): 0 10*3/uL (ref 0.0–0.1)
Immature Granulocytes: 0 %
Lymphocytes Absolute: 0.7 10*3/uL (ref 0.7–3.1)
Lymphs: 23 %
MCH: 30.3 pg (ref 26.6–33.0)
MCHC: 34.6 g/dL (ref 31.5–35.7)
MCV: 88 fL (ref 79–97)
Monocytes Absolute: 0.2 10*3/uL (ref 0.1–0.9)
Monocytes: 5 %
Neutrophils Absolute: 2.2 10*3/uL (ref 1.4–7.0)
Neutrophils: 71 %
Platelets: 77 10*3/uL — CL (ref 150–450)
RBC: 4.22 x10E6/uL (ref 3.77–5.28)
RDW: 12.3 % (ref 11.7–15.4)
WBC: 3.1 10*3/uL — ABNORMAL LOW (ref 3.4–10.8)

## 2021-12-07 LAB — THYROID PANEL WITH TSH
Free Thyroxine Index: 3.3 (ref 1.2–4.9)
T3 Uptake Ratio: 24 % (ref 24–39)
T4, Total: 13.6 ug/dL — ABNORMAL HIGH (ref 4.5–12.0)
TSH: 0.655 u[IU]/mL (ref 0.450–4.500)

## 2021-12-07 LAB — LIPID PANEL
Chol/HDL Ratio: 3.1 ratio (ref 0.0–4.4)
Cholesterol, Total: 194 mg/dL (ref 100–199)
HDL: 62 mg/dL (ref >39)
LDL Chol Calc (NIH): 119 mg/dL — ABNORMAL HIGH (ref 0–99)
Triglycerides: 73 mg/dL (ref 0–149)
VLDL Cholesterol Cal: 13 mg/dL (ref 5–40)

## 2021-12-07 LAB — CMP14+EGFR
ALT: 14 IU/L (ref 0–32)
AST: 29 IU/L (ref 0–40)
Albumin/Globulin Ratio: 1.8 (ref 1.2–2.2)
Albumin: 4.6 g/dL (ref 3.7–4.7)
Alkaline Phosphatase: 216 IU/L — ABNORMAL HIGH (ref 44–121)
BUN/Creatinine Ratio: 10 — ABNORMAL LOW (ref 12–28)
BUN: 8 mg/dL (ref 8–27)
Bilirubin Total: 0.7 mg/dL (ref 0.0–1.2)
CO2: 24 mmol/L (ref 20–29)
Calcium: 10 mg/dL (ref 8.7–10.3)
Chloride: 97 mmol/L (ref 96–106)
Creatinine, Ser: 0.78 mg/dL (ref 0.57–1.00)
Globulin, Total: 2.5 g/dL (ref 1.5–4.5)
Glucose: 101 mg/dL — ABNORMAL HIGH (ref 70–99)
Potassium: 4.7 mmol/L (ref 3.5–5.2)
Sodium: 135 mmol/L (ref 134–144)
Total Protein: 7.1 g/dL (ref 6.0–8.5)
eGFR: 81 mL/min/{1.73_m2} (ref >59)

## 2021-12-12 LAB — TOXASSURE SELECT 13 (MW), URINE

## 2022-02-11 ENCOUNTER — Other Ambulatory Visit (HOSPITAL_COMMUNITY): Payer: Self-pay | Admitting: *Deleted

## 2022-02-11 ENCOUNTER — Ambulatory Visit (HOSPITAL_COMMUNITY)
Admission: RE | Admit: 2022-02-11 | Discharge: 2022-02-11 | Disposition: A | Discharge status: Home / Self Care | Payer: Medicare PPO | Source: Ambulatory Visit | Attending: Physician Assistant | Admitting: Physician Assistant

## 2022-02-11 DIAGNOSIS — N289 Disorder of kidney and ureter, unspecified: Secondary | ICD-10-CM | POA: Diagnosis present

## 2022-02-11 DIAGNOSIS — N2889 Other specified disorders of kidney and ureter: Secondary | ICD-10-CM | POA: Insufficient documentation

## 2022-02-11 NOTE — Progress Notes (Signed)
Patient notified of results and future ultrasound.  Verbalized understanding.

## 2022-02-22 ENCOUNTER — Ambulatory Visit (HOSPITAL_COMMUNITY): Payer: Medicare PPO

## 2022-03-04 ENCOUNTER — Telehealth: Payer: Self-pay

## 2022-03-04 ENCOUNTER — Other Ambulatory Visit: Payer: Self-pay

## 2022-03-04 ENCOUNTER — Other Ambulatory Visit (INDEPENDENT_AMBULATORY_CARE_PROVIDER_SITE_OTHER): Payer: Medicare PPO

## 2022-03-04 DIAGNOSIS — K7460 Unspecified cirrhosis of liver: Secondary | ICD-10-CM

## 2022-03-04 DIAGNOSIS — D696 Thrombocytopenia, unspecified: Secondary | ICD-10-CM

## 2022-03-04 DIAGNOSIS — N289 Disorder of kidney and ureter, unspecified: Secondary | ICD-10-CM

## 2022-03-04 DIAGNOSIS — E611 Iron deficiency: Secondary | ICD-10-CM

## 2022-03-04 LAB — PROTIME-INR
INR: 1.1 ratio — ABNORMAL HIGH (ref 0.8–1.0)
Prothrombin Time: 12 s (ref 9.6–13.1)

## 2022-03-04 LAB — CBC WITH DIFFERENTIAL/PLATELET
Basophils Absolute: 0 10*3/uL (ref 0.0–0.1)
Basophils Relative: 0.4 % (ref 0.0–3.0)
Eosinophils Absolute: 0 10*3/uL (ref 0.0–0.7)
Eosinophils Relative: 0.7 % (ref 0.0–5.0)
HCT: 41.9 % (ref 36.0–46.0)
Hemoglobin: 14 g/dL (ref 12.0–15.0)
Lymphocytes Relative: 22.8 % (ref 12.0–46.0)
Lymphs Abs: 1 10*3/uL (ref 0.7–4.0)
MCHC: 33.3 g/dL (ref 30.0–36.0)
MCV: 90.2 fl (ref 78.0–100.0)
Monocytes Absolute: 0.3 10*3/uL (ref 0.1–1.0)
Monocytes Relative: 6.3 % (ref 3.0–12.0)
Neutro Abs: 3.1 10*3/uL (ref 1.4–7.7)
Neutrophils Relative %: 69.8 % (ref 43.0–77.0)
Platelets: 81 10*3/uL — ABNORMAL LOW (ref 150.0–400.0)
RBC: 4.64 Mil/uL (ref 3.87–5.11)
RDW: 14.3 % (ref 11.5–15.5)
WBC: 4.5 10*3/uL (ref 4.0–10.5)

## 2022-03-04 LAB — COMPREHENSIVE METABOLIC PANEL WITH GFR
ALT: 14 U/L (ref 0–35)
AST: 22 U/L (ref 0–37)
Albumin: 4.4 g/dL (ref 3.5–5.2)
Alkaline Phosphatase: 191 U/L — ABNORMAL HIGH (ref 39–117)
BUN: 11 mg/dL (ref 6–23)
CO2: 27 meq/L (ref 19–32)
Calcium: 10.4 mg/dL (ref 8.4–10.5)
Chloride: 105 meq/L (ref 96–112)
Creatinine, Ser: 0.79 mg/dL (ref 0.40–1.20)
GFR: 75.32 mL/min
Glucose, Bld: 95 mg/dL (ref 70–99)
Potassium: 4.8 meq/L (ref 3.5–5.1)
Sodium: 131 meq/L — ABNORMAL LOW (ref 135–145)
Total Bilirubin: 0.6 mg/dL (ref 0.2–1.2)
Total Protein: 7.8 g/dL (ref 6.0–8.3)

## 2022-03-04 NOTE — Telephone Encounter (Signed)
Informed patient to come by our lab in the basement for labs this week. Also, informed patient that radiology will be contacting her directly to schedule RUQ u/s. Patient states she would like that scheduled at [facility]. Informed patient I put that in the order so when they contact her they will schedule it at that hospital. Patient verbalized understanding.

## 2022-03-04 NOTE — Telephone Encounter (Signed)
-----   Message from Lindon Romp, [place] sent at 09/19/2021 11:52 AM EST ----- Patient needs 6 month f/u labs and RUQ ultrasound for cirrhosis/HCC screening. Labs: CBC, CMP, PT/INR, AFP.

## 2022-03-07 LAB — AFP TUMOR MARKER: AFP-Tumor Marker: 3.4 ng/mL

## 2022-03-11 ENCOUNTER — Other Ambulatory Visit: Payer: Self-pay

## 2022-03-11 DIAGNOSIS — K7460 Unspecified cirrhosis of liver: Secondary | ICD-10-CM

## 2022-03-13 ENCOUNTER — Ambulatory Visit (HOSPITAL_COMMUNITY)
Admission: RE | Admit: 2022-03-13 | Discharge: 2022-03-13 | Disposition: A | Discharge status: Home / Self Care | Payer: Medicare PPO | Source: Ambulatory Visit | Attending: Gastroenterology | Admitting: Gastroenterology

## 2022-03-13 DIAGNOSIS — K7460 Unspecified cirrhosis of liver: Secondary | ICD-10-CM | POA: Diagnosis not present

## 2022-03-13 DIAGNOSIS — Z0389 Encounter for observation for other suspected diseases and conditions ruled out: Secondary | ICD-10-CM | POA: Diagnosis not present

## 2022-03-20 ENCOUNTER — Other Ambulatory Visit (HOSPITAL_COMMUNITY): Payer: Self-pay

## 2022-03-20 DIAGNOSIS — D696 Thrombocytopenia, unspecified: Secondary | ICD-10-CM

## 2022-03-20 DIAGNOSIS — E611 Iron deficiency: Secondary | ICD-10-CM

## 2022-03-21 ENCOUNTER — Inpatient Hospital Stay (HOSPITAL_COMMUNITY): Payer: Medicare PPO | Attending: Hematology

## 2022-03-21 DIAGNOSIS — D696 Thrombocytopenia, unspecified: Secondary | ICD-10-CM | POA: Diagnosis not present

## 2022-03-21 DIAGNOSIS — F419 Anxiety disorder, unspecified: Secondary | ICD-10-CM | POA: Diagnosis not present

## 2022-03-21 DIAGNOSIS — K219 Gastro-esophageal reflux disease without esophagitis: Secondary | ICD-10-CM | POA: Diagnosis not present

## 2022-03-21 DIAGNOSIS — K7460 Unspecified cirrhosis of liver: Secondary | ICD-10-CM | POA: Diagnosis not present

## 2022-03-21 DIAGNOSIS — M1990 Unspecified osteoarthritis, unspecified site: Secondary | ICD-10-CM | POA: Diagnosis not present

## 2022-03-21 DIAGNOSIS — Z808 Family history of malignant neoplasm of other organs or systems: Secondary | ICD-10-CM | POA: Diagnosis not present

## 2022-03-21 DIAGNOSIS — E039 Hypothyroidism, unspecified: Secondary | ICD-10-CM | POA: Insufficient documentation

## 2022-03-21 DIAGNOSIS — I10 Essential (primary) hypertension: Secondary | ICD-10-CM | POA: Diagnosis not present

## 2022-03-21 DIAGNOSIS — K7581 Nonalcoholic steatohepatitis (NASH): Secondary | ICD-10-CM | POA: Insufficient documentation

## 2022-03-21 DIAGNOSIS — D72819 Decreased white blood cell count, unspecified: Secondary | ICD-10-CM | POA: Diagnosis not present

## 2022-03-21 DIAGNOSIS — N289 Disorder of kidney and ureter, unspecified: Secondary | ICD-10-CM | POA: Diagnosis not present

## 2022-03-21 DIAGNOSIS — E611 Iron deficiency: Secondary | ICD-10-CM | POA: Insufficient documentation

## 2022-03-21 LAB — CBC WITH DIFFERENTIAL/PLATELET
Abs Immature Granulocytes: 0.01 10*3/uL (ref 0.00–0.07)
Basophils Absolute: 0 10*3/uL (ref 0.0–0.1)
Basophils Relative: 1 %
Eosinophils Absolute: 0.1 10*3/uL (ref 0.0–0.5)
Eosinophils Relative: 1 %
HCT: 38.3 % (ref 36.0–46.0)
Hemoglobin: 12.6 g/dL (ref 12.0–15.0)
Immature Granulocytes: 0 %
Lymphocytes Relative: 29 %
Lymphs Abs: 1 10*3/uL (ref 0.7–4.0)
MCH: 30.3 pg (ref 26.0–34.0)
MCHC: 32.9 g/dL (ref 30.0–36.0)
MCV: 92.1 fL (ref 80.0–100.0)
Monocytes Absolute: 0.3 10*3/uL (ref 0.1–1.0)
Monocytes Relative: 8 %
Neutro Abs: 2.1 10*3/uL (ref 1.7–7.7)
Neutrophils Relative %: 61 %
Platelets: 82 10*3/uL — ABNORMAL LOW (ref 150–400)
RBC: 4.16 MIL/uL (ref 3.87–5.11)
RDW: 13.2 % (ref 11.5–15.5)
WBC: 3.5 10*3/uL — ABNORMAL LOW (ref 4.0–10.5)
nRBC: 0 % (ref 0.0–0.2)

## 2022-03-21 LAB — IRON AND TIBC
Iron: 130 ug/dL (ref 28–170)
Saturation Ratios: 32 % — ABNORMAL HIGH (ref 10.4–31.8)
TIBC: 410 ug/dL (ref 250–450)
UIBC: 280 ug/dL

## 2022-03-21 LAB — FERRITIN: Ferritin: 22 ng/mL (ref 11–307)

## 2022-03-22 ENCOUNTER — Other Ambulatory Visit (HOSPITAL_COMMUNITY): Payer: Medicare PPO

## 2022-03-28 NOTE — Progress Notes (Signed)
[place], [place] [address]   CLINIC:  Medical Oncology/Hematology  PCP:  Chevis Pretty, [place]  [address] [phone]   REASON FOR VISIT:  Follow-up for thrombocytopenia (liver cirrhosis) and iron deficiency anemia  PRIOR THERAPY: None  CURRENT THERAPY: Observation  INTERVAL HISTORY:  Kelly Lewis 71 y.o. female returns for routine follow-up of thrombocytopenia.  She was last seen by Tarri Abernethy PA-C on 10/25/2021.  At today's visit, she reports feeling fairly well.  She denies any recent hospitalizations or changes to her health.  She has not had any recent episodes of hematochezia, melena, epistaxis, or hematemesis.  No gum bleeding or hematuria. She denies any easy bruising or petechial rash.  She has not had any B symptoms such as fever, chills, night sweats, or unintentional weight loss.  She reports that her energy level has been better ever since she started taking B12 supplement.   She did try taking ferrous sulfate, but was unable to tolerate due to constipation and stomach cramps.  She also tried ferrous bisglycinate, but reports stomach cramping and diarrhea.  Instead, she has been trying to eat iron rich foods.  She denies any pica, severe headaches, or restless legs.  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  She enjoys doing water aerobics, which also helps with her arthritic knee pain.  Her weight has been stable since her last visit. She has 100% energy and 100% appetite.      REVIEW OF SYSTEMS:    Review of Systems  Constitutional:  Negative for appetite change, chills, diaphoresis, fatigue, fever and unexpected weight change.  HENT:   Negative for lump/mass and nosebleeds.   Eyes:  Negative for eye problems.  Respiratory:  Positive for cough (at times). Negative for hemoptysis  and shortness of breath.   Cardiovascular:  Negative for chest pain, leg swelling and palpitations.  Gastrointestinal:  Positive for constipation and diarrhea (alternating). Negative for abdominal pain, blood in stool, nausea and vomiting.  Genitourinary:  Negative for hematuria.   Skin: Negative.   Neurological:  Positive for headaches. Negative for dizziness and light-headedness.  Hematological:  Does not bruise/bleed easily.  Psychiatric/Behavioral:  The patient is nervous/anxious.      PAST MEDICAL/SURGICAL HISTORY:  Past Medical History:  Diagnosis Date   Arthritis    Chronic anxiety    GERD (gastroesophageal reflux disease)    H/O degenerative disc disease    Heart murmur    Hyperlipidemia    Hypertension    Non-alcoholic fatty liver disease    Osteopenia 2021   Plantar fasciitis    Temporary low platelet count ([place]) 2022   94K then up to 104 in May of 2022   Thyroid disease    Past Surgical History:  Procedure Laterality Date   bladder tacked  1984   COLONOSCOPY  2011   1- TA   UPPER GASTROINTESTINAL ENDOSCOPY  2012   at [facility], normal     SOCIAL HISTORY:  Social History   Socioeconomic History   Marital status: Married    Spouse name: Kelly Lewis   Number of children: 2   Years of education: [employer] education level: Bachelor's degree (e.g., BA, AB, BS)  Occupational History   Occupation: Retired   Tobacco Use   Smoking status: Never    Passive exposure: Past (husband usually smokes  outside or in vehicle)   Smokeless tobacco: Never  Vaping Use   Vaping Use: Never used  Substance and Sexual Activity   Alcohol use: No   Drug use: No   Sexual activity: Not Currently  Other Topics Concern   Not on file  Social History Narrative   Not on file   Social Determinants of Health   Financial Resource Strain: Low Risk  (01/11/2019)   Overall Financial Resource Strain (CARDIA)    Difficulty of Paying Living Expenses: Not hard at all  Food Insecurity: No  Food Insecurity (01/11/2019)   Hunger Vital Sign    Worried About Running Out of Food in the Last Year: Never true    Ran Out of Food in the Last Year: Never true  Transportation Needs: No Transportation Needs (01/11/2019)   PRAPARE - Hydrologist (Medical): No    Lack of Transportation (Non-Medical): No  Physical Activity: Inactive (01/11/2019)   Exercise Vital Sign    Days of Exercise per Week: 0 days    Minutes of Exercise per Session: 0 min  Stress: No Stress Concern Present (01/11/2019)   [place]    Feeling of Stress : Not at all  Social Connections: [place] (01/11/2019)   Social Connection and Isolation Panel [NHANES]    Frequency of Communication with Friends and Family: More than three times a week    Frequency of Social Gatherings with Friends and Family: More than three times a week    Attends Religious Services: More than 4 times per year    Active Member of [employer] or Organizations: Yes    Attends Music therapist: More than 4 times per year    Marital Status: Married  Human resources officer Violence: Not At Risk (01/11/2019)   Humiliation, Afraid, Rape, and Kick questionnaire    Fear of Current or Ex-Partner: No    Emotionally Abused: No    Physically Abused: No    Sexually Abused: No    FAMILY HISTORY:  Family History  Problem Relation Age of Onset   Colon polyps Mother    Diabetes Mother    Pneumonia Father    Cancer Father        skin   Colon polyps Sister    Cancer Sister        skin   Colon cancer Neg Hx    Esophageal cancer Neg Hx    Rectal cancer Neg Hx    Stomach cancer Neg Hx     CURRENT MEDICATIONS:  Outpatient Encounter Medications as of 03/29/2022  Medication Sig   Cholecalciferol (VITAMIN D3) 2000 UNITS TABS Take 2,000 Units by mouth daily.    Cranberry 300 MG tablet Take 300 mg by mouth daily. Utiva supplement , cranberry extract 36  PAC   ibuprofen (ADVIL,MOTRIN) 200 MG tablet Take 200 mg by mouth every 6 (six) hours as needed for mild pain.   levothyroxine (SYNTHROID) 100 MCG tablet Take 1 tablet (100 mcg total) by mouth daily.   lisinopril (ZESTRIL) 10 MG tablet Take 1 tablet (10 mg total) by mouth daily.   LORazepam (ATIVAN) 0.5 MG tablet Take 1 tablet (0.5 mg total) by mouth 2 (two) times daily as needed for anxiety.   omeprazole (PRILOSEC) 20 MG capsule TAKE 1 CAPSULE BY MOUTH EVERY DAY   ondansetron (ZOFRAN) 4 MG tablet Take 1 tablet (4 mg total) by mouth 2 (two) times daily as needed for  nausea or vomiting.   Probiotic Product (ALIGN) 4 MG CAPS Take by mouth.   vitamin B-12 (CYANOCOBALAMIN) 1000 MCG tablet Take 1,000 mcg by mouth daily.   vitamin C (ASCORBIC ACID) 500 MG tablet Take 500 mg by mouth daily.   Facility-Administered Encounter Medications as of 03/29/2022  Medication   0.9 %  sodium chloride infusion    ALLERGIES:  Allergies  Allergen Reactions   Amoxicillin-Pot Clavulanate Other (See Comments)    Urinary retention   Cephalexin Nausea And Vomiting    Internal head pressure and nausea   Doxycycline Nausea And Vomiting    Gi and internal head pressure   Nitrofurantoin Nausea And Vomiting    Gi upset and internal head pressure   Ciprofloxacin Hcl Nausea Only and Other (See Comments)    Abdominal pain, anxiety, facial rash   Naprosyn [Naproxen]     gerd   Other Other (See Comments)    Family History of Reaction   Shingrix [Zoster Vac Recomb Adjuvanted]     Family History of Reaction   Sulfonamide Derivatives     REACTION: skin turns red   Xylocaine [Lidocaine Hcl]     Real red, hot and flushed all over. Pt reports the redness was from being nervous and wasn't actually a reaction.      PHYSICAL EXAM:    ECOG PERFORMANCE STATUS: 0 - Asymptomatic  There were no vitals filed for this visit. There were no vitals filed for this visit. Physical Exam Constitutional:      Appearance: Normal  appearance. She is obese.  HENT:     Head: Normocephalic and atraumatic.     Mouth/Throat:     Mouth: Mucous membranes are moist.  Eyes:     Extraocular Movements: Extraocular movements intact.     Pupils: Pupils are equal, round, and reactive to light.  Cardiovascular:     Rate and Rhythm: Normal rate and regular rhythm.     Pulses: Normal pulses.     Heart sounds: Murmur (harsh, RUSB) heard.  Pulmonary:     Effort: Pulmonary effort is normal.     Breath sounds: Normal breath sounds.  Abdominal:     General: Bowel sounds are normal.     Palpations: Abdomen is soft.     Tenderness: There is no abdominal tenderness.  Musculoskeletal:        General: No swelling.     Right lower leg: No edema.     Left lower leg: No edema.  Lymphadenopathy:     Cervical: No cervical adenopathy.  Skin:    General: Skin is warm and dry.  Neurological:     General: No focal deficit present.     Mental Status: She is alert and oriented to person, place, and time.  Psychiatric:        Mood and Affect: Mood normal.        Behavior: Behavior normal.     LABORATORY DATA:  I have reviewed the labs as listed.  CBC    Component Value Date/Time   WBC 3.5 (L) 03/21/2022 0847   RBC 4.16 03/21/2022 0847   HGB 12.6 03/21/2022 0847   HGB 12.8 12/06/2021 0902   HCT 38.3 03/21/2022 0847   HCT 37.0 12/06/2021 0902   PLT 82 (L) 03/21/2022 0847   PLT 77 (LL) 12/06/2021 0902   MCV 92.1 03/21/2022 0847   MCV 88 12/06/2021 0902   MCH 30.3 03/21/2022 0847   MCHC 32.9 03/21/2022 0847   RDW 13.2  03/21/2022 0847   RDW 12.3 12/06/2021 0902   LYMPHSABS 1.0 03/21/2022 0847   LYMPHSABS 0.7 12/06/2021 0902   MONOABS 0.3 03/21/2022 0847   EOSABS 0.1 03/21/2022 0847   EOSABS 0.0 12/06/2021 0902   BASOSABS 0.0 03/21/2022 0847   BASOSABS 0.0 12/06/2021 0902      Latest Ref Rng & Units 03/04/2022    1:22 PM 12/06/2021    9:02 AM 08/09/2021   11:53 AM  CMP  Glucose 70 - 99 mg/dL 95  101  95   BUN 6 - 23 mg/dL  _0 Creatinine 0.40 - 1.20 mg/dL 0.79  0.78  0.68   Sodium 135 - 145 mEq/L 131  135  130   Potassium 3.5 - 5.1 mEq/L 4.8  4.7  4.1   Chloride 96 - 112 mEq/L 105  97  97   CO2 19 - 32 mEq/L _1 Calcium 8.4 - 10.5 mg/dL 10.4  10.0  10.2   Total Protein 6.0 - 8.3 g/dL 7.8  7.1    Total Bilirubin 0.2 - 1.2 mg/dL 0.6  0.7    Alkaline Phos 39 - 117 U/L 191  216    AST 0 - 37 U/L 22  29    ALT 0 - 35 U/L 14  14      DIAGNOSTIC IMAGING:  I have independently reviewed the relevant imaging and discussed with the patient.  ASSESSMENT & PLAN: 1.  Thrombocytopenia & leukopenia - Review of past labs shows normal CBC in 2012, with onset of thrombocytopenia in 2018, which has been progressive since that time.  Most recent platelets (06/06/2021) were 79. - Abdominal ultrasound (2012) showed fatty infiltration of the liver - Abdominal ultrasound (07/09/2021): Fatty liver with nodularity in liver surface suggesting possible cirrhosis; splenomegaly with 14 cm maximum diameter and splenic volume 690 mL - Diagnosis of cirrhosis likely secondary to NASH was confirmed by gastroenterology (December 2022) - Mild thrombocytopenia noted on pathology smear review (06/28/2021) - Other work-up of thrombocytopenia was unremarkable.  Negative H. pylori, hepatitis B, hepatitis C, HIV.  Nutritional panel unremarkable with normal B12, folate, methylmalonic acid, copper.  ANA and rheumatoid factor negative..  LDH normal. - No bleeding, bruising, petechial rash      - No unexplained fever, chills, night sweats, weight loss.  No new lumps or bumps.       - No lymphadenopathy or hepatosplenomegaly palpated on exam   - Most recent CBC (03/21/2022): Platelets 82, WBC 3.5 with normal differential - Differential diagnosis favors thrombocytopenia and leukopenia secondary to cirrhosis and splenic sequestration.  If worsening unexplained abnormalities in CBC, would consider bone marrow biopsy for further work-up. -  PLAN: No indication for treatment at this time, but would consider further treatment if platelets were persistently less than 30.   - Repeat CBC and RTC in 6 months.   2.  Iron deficiency without anemia - Iron panel (03/21/2022): Ferritin 22, iron saturation 32% with normal Hgb 12.6/MCV 92.1 - No signs or symptoms of GI bleeding apart from occasional hemorrhoidal bleeding   - She is scheduled for EGD/colonoscopy on 11/15/2021   -She was unable to tolerate ferrous sulfate due to constipation and abdominal cramping - Ferrous bisglycinate caused abdominal cramping and diarrhea - PLAN: We discussed possibility of IV iron, but patient would like to retry ferrous bisglycinate now that she is no longer taking her PPI, to see if it is absorbed better and  if she tolerates it better. - Repeat CBC and iron panel at follow-up visit in 6 months and consider IV iron if not improved.   3.   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  Radiology recommendations for follow-up renal sonogram in 6 months to assess stability. - Renal ultrasound (02/11/2022): Stable 8 mm hyperechoic focus in the midpole cortex of right kidney which may represent angiomyolipoma.  Follow-up ultrasound in 12 months recommended to ensure stability. - PLAN: We will repeat renal ultrasound in 1 year (June 2024).     4.  Other history - PMH: Osteoarthritis, hypothyroidism, hypertension, anxiety, GERD.  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 Fuller Plan of [facility]. - SOCIAL: Retired Pharmacist, hospital.  Lives with her husband.  No tobacco, alcohol, drugs. - FAMILY: Father with NASH.  Father and sister with melanoma.   PLAN SUMMARY & DISPOSITION: Labs and PHONE visit in 6 months   All questions were answered. The patient  knows to call the clinic with any problems, questions or concerns.  Medical decision making: Moderate    Time spent on visit: I spent 20 minutes counseling the patient face to face. The total time spent in the appointment was 30 minutes and more than 50% was on counseling.   Harriett Rush, PA-C  03/29/2022 10:47 AM

## 2022-03-29 ENCOUNTER — Inpatient Hospital Stay (HOSPITAL_COMMUNITY): Payer: Medicare PPO | Admitting: Physician Assistant

## 2022-03-29 VITALS — HR 79 | Temp 98.9°F | Resp 18 | Ht 64.17 in | Wt 194.2 lb

## 2022-03-29 DIAGNOSIS — D72819 Decreased white blood cell count, unspecified: Secondary | ICD-10-CM | POA: Diagnosis not present

## 2022-03-29 DIAGNOSIS — E611 Iron deficiency: Secondary | ICD-10-CM

## 2022-03-29 DIAGNOSIS — K7460 Unspecified cirrhosis of liver: Secondary | ICD-10-CM | POA: Diagnosis not present

## 2022-03-29 DIAGNOSIS — M1990 Unspecified osteoarthritis, unspecified site: Secondary | ICD-10-CM | POA: Diagnosis not present

## 2022-03-29 DIAGNOSIS — E039 Hypothyroidism, unspecified: Secondary | ICD-10-CM | POA: Diagnosis not present

## 2022-03-29 DIAGNOSIS — D696 Thrombocytopenia, unspecified: Secondary | ICD-10-CM | POA: Diagnosis not present

## 2022-03-29 DIAGNOSIS — K7581 Nonalcoholic steatohepatitis (NASH): Secondary | ICD-10-CM | POA: Diagnosis not present

## 2022-03-29 DIAGNOSIS — N289 Disorder of kidney and ureter, unspecified: Secondary | ICD-10-CM | POA: Diagnosis not present

## 2022-03-29 DIAGNOSIS — I10 Essential (primary) hypertension: Secondary | ICD-10-CM | POA: Diagnosis not present

## 2022-03-29 NOTE — Patient Instructions (Signed)
[place] at [facility] Discharge Instructions  You were seen today by Tarri Abernethy PA-C for your LOW PLATELETS and LOW IRON.  Your platelets are stable.  We will continue to monitor this.  Your iron levels are still low.  I suggest trying a different type of iron - FERROUS BISGLYCINATE - which can be gentler on your digestive tract.  Eat a diet rich in iron. Take your iron in the morning. Take your iron pill with a glass of orange juice to help your body absorb it better. If you take any antacid or acid reflux medicine at home, take your iron at a different time, separated by at least 2 hours from your antacid medication. If you continue to have severe side effects such as constipation or upset stomach, you can decrease your iron to every other day instead. If you are still having severe side effects at lower dose, please stop taking your iron tablet and call our office to let us know.  LABS: Return in 6 months for repeat labs   FOLLOW-UP APPOINTMENT: Office visit after labs   Thank you for choosing [place] at [facility] to provide your oncology and hematology care.  To afford each patient quality time with our provider, please arrive at least 15 minutes before your scheduled appointment time.   If you have a lab appointment with the [facility] please come in thru the Main Entrance and check in at the main information desk.  You need to re-schedule your appointment should you arrive 10 or more minutes late.  We strive to give you quality time with our providers, and arriving late affects you and other patients whose appointments are after yours.  Also, if you no show three or more times for appointments you may be dismissed from the clinic at the providers discretion.     Again, thank you for choosing [facility].  Our hope is that these requests will decrease the amount of time that you wait before being seen by our  physicians.       _____________________________________________________________  Should you have questions after your visit to [facility], please contact our office at [phone] and follow the prompts.  Our office hours are 8:00 a.m. and 4:30 p.m. Monday - Friday.  Please note that voicemails left after 4:00 p.m. may not be returned until the following business day.  We are closed weekends and major holidays.  You do have access to a nurse 24-7, just call the main number to the clinic [phone] and do not press any options, hold on the line and a nurse will answer the phone.    For prescription refill requests, have your pharmacy contact our office and allow 72 hours.    Due to Covid, you will need to wear a mask upon entering the hospital. If you do not have a mask, a mask will be given to you at the Main Entrance upon arrival. For doctor visits, patients may have 1 support person age 36 or older with them. For treatment visits, patients can not have anyone with them due to social distancing guidelines and our immunocompromised population.

## 2022-04-11 ENCOUNTER — Encounter: Payer: Self-pay | Admitting: Family Medicine

## 2022-04-11 ENCOUNTER — Ambulatory Visit: Payer: Medicare PPO | Admitting: Family Medicine

## 2022-04-11 VITALS — BP 139/75 | HR 92 | Temp 98.6°F | Ht 64.0 in | Wt 198.0 lb

## 2022-04-11 DIAGNOSIS — Z23 Encounter for immunization: Secondary | ICD-10-CM | POA: Diagnosis not present

## 2022-04-11 DIAGNOSIS — T2122XA Burn of second degree of abdominal wall, initial encounter: Secondary | ICD-10-CM

## 2022-04-11 MED ORDER — SILVER SULFADIAZINE 1 % EX CREA: 1.0000 | TOPICAL_CREAM | Freq: Every day | CUTANEOUS | 0 refills | Status: DC
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
## 2022-04-25 ENCOUNTER — Ambulatory Visit: Payer: Medicare PPO | Admitting: Nurse Practitioner

## 2022-04-25 ENCOUNTER — Encounter: Payer: Self-pay | Admitting: Nurse Practitioner

## 2022-04-25 VITALS — BP 126/71 | HR 69 | Temp 98.3°F | Resp 20 | Ht 64.0 in | Wt 196.0 lb

## 2022-04-25 DIAGNOSIS — T2122XD Burn of second degree of abdominal wall, subsequent encounter: Secondary | ICD-10-CM | POA: Diagnosis not present

## 2022-04-25 DIAGNOSIS — T2122XA Burn of second degree of abdominal wall, initial encounter: Secondary | ICD-10-CM
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
## 2022-06-20 ENCOUNTER — Ambulatory Visit: Payer: Medicare PPO | Admitting: Nurse Practitioner

## 2022-06-28 ENCOUNTER — Ambulatory Visit: Payer: Medicare PPO | Admitting: Nurse Practitioner

## 2022-06-28 ENCOUNTER — Encounter: Payer: Self-pay | Admitting: Nurse Practitioner

## 2022-06-28 VITALS — BP 130/67 | HR 74 | Temp 97.2°F | Resp 20 | Ht 64.0 in | Wt 198.0 lb

## 2022-06-28 DIAGNOSIS — Z6834 Body mass index (BMI) 34.0-34.9, adult: Secondary | ICD-10-CM

## 2022-06-28 DIAGNOSIS — Z23 Encounter for immunization: Secondary | ICD-10-CM | POA: Diagnosis not present

## 2022-06-28 DIAGNOSIS — D696 Thrombocytopenia, unspecified: Secondary | ICD-10-CM | POA: Diagnosis not present

## 2022-06-28 DIAGNOSIS — E039 Hypothyroidism, unspecified: Secondary | ICD-10-CM

## 2022-06-28 DIAGNOSIS — I10 Essential (primary) hypertension: Secondary | ICD-10-CM

## 2022-06-28 DIAGNOSIS — K219 Gastro-esophageal reflux disease without esophagitis: Secondary | ICD-10-CM | POA: Diagnosis not present

## 2022-06-28 DIAGNOSIS — F411 Generalized anxiety disorder: Secondary | ICD-10-CM

## 2022-06-28 MED ORDER — LEVOTHYROXINE SODIUM 100 MCG PO TABS: 100.0000 ug | ORAL_TABLET | Freq: Every day | ORAL | 1 refills | Status: DC

## 2022-06-28 MED ORDER — LORAZEPAM 0.5 MG PO TABS: 0.5000 mg | ORAL_TABLET | Freq: Two times a day (BID) | ORAL | 5 refills | Status: DC | PRN

## 2022-06-28 MED ORDER — LISINOPRIL 10 MG PO TABS: 10.0000 mg | ORAL_TABLET | Freq: Every day | ORAL | 1 refills | Status: DC

## 2022-06-28 NOTE — Progress Notes (Signed)
Subjective:    Patient ID: Kelly Lewis, female    DOB: 08-12-1951, 71 y.o.   MRN: [id number]   Chief Complaint: Medical Management of Chronic Issues    HPI:  Kelly Lewis is a 71 y.o. who identifies as a female who was assigned female at birth.   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 Primary hypertension No c/o chest pain, sob or headache. Does check blood pressure at home. Runs around 537'S systolic. Patient has not been taking her blood pressure meds daily. BP Readings from Last 3 Encounters:  06/28/22 130/67  04/25/22 126/71  04/11/22 139/75     2. Acquired hypothyroidism No problems that she is aware of. Lab Results  Component Value Date   TSH 0.655 12/06/2021     3. GAD (generalized anxiety disorder) Is on ativan daily. Most days she only takes 1 tablet.    06/28/2022   11:29 AM 04/25/2022    8:57 AM 04/11/2022   10:06 AM 12/06/2021    8:21 AM  GAD 7 : Generalized Anxiety Score  Nervous, Anxious, on Edge 1 0 1 1  Control/stop worrying 0 0 0 1  Worry too much - different things 0 0 0 0  Trouble relaxing 0 0 0 0  Restless 0 0 0 0  Easily annoyed or irritable 0 0 0 0  Afraid - awful might happen 0 0 0 0  Total GAD 7 Score 1 0 1 2  Anxiety Difficulty Not difficult at all Not difficult at all Not difficult at all Not difficult at all      4. Gastroesophageal reflux disease without esophagitis Has had no symptoms  5. Thrombocytopenia ([place]) Lab Results  Component Value Date   PLT 82 (L) 03/21/2022      6. BMI 34.0-34.9,adult No recent weight changes Wt Readings from Last 3 Encounters:  06/28/22 198 lb (89.8 kg)  04/25/22 196 lb (88.9 kg)  04/11/22 198 lb (89.8 kg)   BMI Readings from Last 3 Encounters:  06/28/22 33.99 kg/m  04/25/22 33.64 kg/m  04/11/22 33.99 kg/m      New complaints: None today  Allergies  Allergen  Reactions   Amoxicillin-Pot Clavulanate Other (See Comments)    Urinary retention   Cephalexin Nausea And Vomiting    Internal head pressure and nausea   Doxycycline Nausea And Vomiting    Gi and internal head pressure   Nitrofurantoin Nausea And Vomiting    Gi upset and internal head pressure   Ciprofloxacin Hcl Nausea Only and Other (See Comments)    Abdominal pain, anxiety, facial rash   Naprosyn [Naproxen]     gerd   Other Other (See Comments)    Family History of Reaction   Shingrix [Zoster Vac Recomb Adjuvanted]     Family History of Reaction   Sulfonamide Derivatives     REACTION: skin turns red   Xylocaine [Lidocaine Hcl]     Real red, hot and flushed all over. Pt reports the redness was from being nervous and wasn't actually a reaction.    Outpatient Encounter Medications as of 06/28/2022  Medication Sig   Cholecalciferol (VITAMIN D3) 2000 UNITS TABS Take 2,000 Units by mouth daily.    Cranberry 300 MG tablet Take 300 mg by mouth daily. Utiva supplement , cranberry extract 36 PAC   Fe Bisgly-Vit C-Vit B12-FA (GENTLE IRON PO) Take by  mouth every other day.   ibuprofen (ADVIL,MOTRIN) 200 MG tablet Take 200 mg by mouth every 6 (six) hours as needed for mild pain.   levothyroxine (SYNTHROID) 100 MCG tablet Take 1 tablet (100 mcg total) by mouth daily.   LORazepam (ATIVAN) 0.5 MG tablet Take 1 tablet (0.5 mg total) by mouth 2 (two) times daily as needed for anxiety.   ondansetron (ZOFRAN) 4 MG tablet Take 1 tablet (4 mg total) by mouth 2 (two) times daily as needed for nausea or vomiting.   Probiotic Product (ALIGN) 4 MG CAPS Take by mouth.   vitamin B-12 (CYANOCOBALAMIN) 1000 MCG tablet Take 1,000 mcg by mouth daily.   vitamin C (ASCORBIC ACID) 500 MG tablet Take 500 mg by mouth daily.   lisinopril (ZESTRIL) 10 MG tablet Take 1 tablet (10 mg total) by mouth daily. (Patient not taking: Reported on 06/28/2022)   [DISCONTINUED] omeprazole (PRILOSEC) 20 MG capsule TAKE 1 CAPSULE  BY MOUTH EVERY DAY   [DISCONTINUED] silver sulfADIAZINE (SILVADENE) 1 % cream Apply 1 Application topically daily.   Facility-Administered Encounter Medications as of 06/28/2022  Medication   0.9 %  sodium chloride infusion    Past Surgical History:  Procedure Laterality Date   bladder tacked  1984   COLONOSCOPY  2011   1- TA   UPPER GASTROINTESTINAL ENDOSCOPY  2012   at [place], normal    Family History  Problem Relation Age of Onset   Colon polyps Mother    Diabetes Mother    Pneumonia Father    Cancer Father        skin   Colon polyps Sister    Cancer Sister        skin   Colon cancer Neg Hx    Esophageal cancer Neg Hx    Rectal cancer Neg Hx    Stomach cancer Neg Hx       Controlled substance contract: 12/13/21- last drug screen 12/06/21     Review of Systems  Constitutional:  Negative for diaphoresis.  Eyes:  Negative for pain.  Respiratory:  Negative for shortness of breath.   Cardiovascular:  Negative for chest pain, palpitations and leg swelling.  Gastrointestinal:  Negative for abdominal pain.  Endocrine: Negative for polydipsia.  Skin:  Negative for rash.  Neurological:  Negative for dizziness, weakness and headaches.  Hematological:  Does not bruise/bleed easily.  All other systems reviewed and are negative.      Objective:   Physical Exam Vitals and nursing note reviewed.  Constitutional:      General: She is not in acute distress.    Appearance: Normal appearance. She is well-developed.  HENT:     Head: Normocephalic.     Right Ear: Tympanic membrane normal.     Left Ear: Tympanic membrane normal.     Nose: Nose normal.     Mouth/Throat:     Mouth: Mucous membranes are moist.  Eyes:     Pupils: Pupils are equal, round, and reactive to light.  Neck:     Vascular: No carotid bruit or JVD.  Cardiovascular:     Rate and Rhythm: Normal rate and regular rhythm.     Heart sounds: Normal heart sounds.  Pulmonary:     Effort: Pulmonary effort  is normal. No respiratory distress.     Breath sounds: Normal breath sounds. No wheezing or rales.  Chest:     Chest wall: No tenderness.  Abdominal:     General: Bowel sounds are normal. There  is no distension or abdominal bruit.     Palpations: Abdomen is soft. There is no hepatomegaly, splenomegaly, mass or pulsatile mass.     Tenderness: There is no abdominal tenderness.  Musculoskeletal:        General: Normal range of motion.     Cervical back: Normal range of motion and neck supple.  Lymphadenopathy:     Cervical: No cervical adenopathy.  Skin:    General: Skin is warm and dry.  Neurological:     Mental Status: She is alert and oriented to person, place, and time.     Deep Tendon Reflexes: Reflexes are normal and symmetric.  Psychiatric:        Behavior: Behavior normal.        Thought Content: Thought content normal.        Judgment: Judgment normal.     BP 130/67   Pulse 74   Temp (!) 97.2 F (36.2 C) (Temporal)   Resp 20   Ht _0  (1.626 m)   Wt 198 lb (89.8 kg)   SpO2 100%   BMI 33.99 kg/m        Assessment & Plan:   Kelly Lewis comes in today with chief complaint of Medical Management of Chronic Issues   Diagnosis and orders addressed:  1. Primary hypertension Low sodium diet - lisinopril (ZESTRIL) 10 MG tablet; Take 1 tablet (10 mg total) by mouth daily.  Dispense: 90 tablet; Refill: 1 - CBC with Differential/Platelet - CMP14+EGFR - Lipid panel  2. Acquired hypothyroidism Labs pending - levothyroxine (SYNTHROID) 100 MCG tablet; Take 1 tablet (100 mcg total) by mouth daily.  Dispense: 90 tablet; Refill: 1 - Thyroid Panel With TSH  3. GAD (generalized anxiety disorder) Stress management - LORazepam (ATIVAN) 0.5 MG tablet; Take 1 tablet (0.5 mg total) by mouth 2 (two) times daily as needed for anxiety.  Dispense: 60 tablet; Refill: 5  4. Gastroesophageal reflux disease without esophagitis Avoid spicy foods Do not eat 2 hours prior to  bedtime   5. Thrombocytopenia ([place]) Labs pending  6. BMI 34.0-34.9,adult Discussed diet and exercise for person with BMI >25 Will recheck weight in 3-6 months    Labs pending Health Maintenance reviewed Diet and exercise encouraged  Follow up plan: 6 months   Mary-Margaret Hassell Done, FNP

## 2022-06-28 NOTE — Patient Instructions (Signed)

## 2022-06-29 LAB — CMP14+EGFR
ALT: 16 IU/L (ref 0–32)
AST: 24 IU/L (ref 0–40)
Albumin/Globulin Ratio: 1.6 (ref 1.2–2.2)
Albumin: 4.1 g/dL (ref 3.8–4.8)
Alkaline Phosphatase: 174 IU/L — ABNORMAL HIGH (ref 44–121)
BUN/Creatinine Ratio: 14 (ref 12–28)
BUN: 10 mg/dL (ref 8–27)
Bilirubin Total: 0.6 mg/dL (ref 0.0–1.2)
CO2: 23 mmol/L (ref 20–29)
Calcium: 9.6 mg/dL (ref 8.7–10.3)
Chloride: 103 mmol/L (ref 96–106)
Creatinine, Ser: 0.71 mg/dL (ref 0.57–1.00)
Globulin, Total: 2.6 g/dL (ref 1.5–4.5)
Glucose: 90 mg/dL (ref 70–99)
Potassium: 4.8 mmol/L (ref 3.5–5.2)
Sodium: 139 mmol/L (ref 134–144)
Total Protein: 6.7 g/dL (ref 6.0–8.5)
eGFR: 91 mL/min/{1.73_m2} (ref >59)

## 2022-06-29 LAB — CBC WITH DIFFERENTIAL/PLATELET
Basophils Absolute: 0 10*3/uL (ref 0.0–0.2)
Basos: 1 %
EOS (ABSOLUTE): 0 10*3/uL (ref 0.0–0.4)
Eos: 1 %
Hematocrit: 38.6 % (ref 34.0–46.6)
Hemoglobin: 13.1 g/dL (ref 11.1–15.9)
Immature Grans (Abs): 0 10*3/uL (ref 0.0–0.1)
Immature Granulocytes: 0 %
Lymphocytes Absolute: 0.9 10*3/uL (ref 0.7–3.1)
Lymphs: 27 %
MCH: 30.6 pg (ref 26.6–33.0)
MCHC: 33.9 g/dL (ref 31.5–35.7)
MCV: 90 fL (ref 79–97)
Monocytes Absolute: 0.2 10*3/uL (ref 0.1–0.9)
Monocytes: 7 %
Neutrophils Absolute: 2.2 10*3/uL (ref 1.4–7.0)
Neutrophils: 64 %
Platelets: 75 10*3/uL — CL (ref 150–450)
RBC: 4.28 x10E6/uL (ref 3.77–5.28)
RDW: 12.8 % (ref 11.7–15.4)
WBC: 3.4 10*3/uL (ref 3.4–10.8)

## 2022-06-29 LAB — LIPID PANEL
Chol/HDL Ratio: 2.7 ratio (ref 0.0–4.4)
Cholesterol, Total: 186 mg/dL (ref 100–199)
HDL: 70 mg/dL (ref >39)
LDL Chol Calc (NIH): 103 mg/dL — ABNORMAL HIGH (ref 0–99)
Triglycerides: 70 mg/dL (ref 0–149)
VLDL Cholesterol Cal: 13 mg/dL (ref 5–40)

## 2022-06-29 LAB — THYROID PANEL WITH TSH
Free Thyroxine Index: 3.3 (ref 1.2–4.9)
T3 Uptake Ratio: 26 % (ref 24–39)
T4, Total: 12.7 ug/dL — ABNORMAL HIGH (ref 4.5–12.0)
TSH: 0.198 u[IU]/mL — ABNORMAL LOW (ref 0.450–4.500)

## 2022-07-01 ENCOUNTER — Other Ambulatory Visit: Payer: Self-pay

## 2022-07-01 DIAGNOSIS — E039 Hypothyroidism, unspecified: Secondary | ICD-10-CM

## 2022-07-01 MED ORDER — LEVOTHYROXINE SODIUM 88 MCG PO TABS: 88.0000 ug | ORAL_TABLET | Freq: Every day | ORAL | 1 refills | Status: DC

## 2022-07-01 NOTE — Addendum Note (Signed)
Addended by: Chevis Pretty on: 07/01/2022 01:02 PM   Modules accepted: Orders

## 2022-07-10 ENCOUNTER — Encounter: Payer: Self-pay | Admitting: *Deleted

## 2022-09-03 ENCOUNTER — Other Ambulatory Visit: Payer: Medicare PPO

## 2022-09-03 DIAGNOSIS — E039 Hypothyroidism, unspecified: Secondary | ICD-10-CM

## 2022-09-04 LAB — THYROID PANEL WITH TSH
Free Thyroxine Index: 2.5 (ref 1.2–4.9)
T3 Uptake Ratio: 24 % (ref 24–39)
T4, Total: 10.5 ug/dL (ref 4.5–12.0)
TSH: 1.53 u[IU]/mL (ref 0.450–4.500)

## 2022-09-11 ENCOUNTER — Other Ambulatory Visit: Payer: Self-pay

## 2022-09-11 DIAGNOSIS — K7460 Unspecified cirrhosis of liver: Secondary | ICD-10-CM

## 2022-09-24 ENCOUNTER — Inpatient Hospital Stay: Payer: Medicare PPO | Attending: Hematology

## 2022-09-24 DIAGNOSIS — D509 Iron deficiency anemia, unspecified: Secondary | ICD-10-CM | POA: Insufficient documentation

## 2022-09-24 DIAGNOSIS — K7460 Unspecified cirrhosis of liver: Secondary | ICD-10-CM | POA: Insufficient documentation

## 2022-09-24 DIAGNOSIS — D696 Thrombocytopenia, unspecified: Secondary | ICD-10-CM | POA: Diagnosis not present

## 2022-09-24 DIAGNOSIS — E611 Iron deficiency: Secondary | ICD-10-CM

## 2022-09-24 LAB — IRON AND TIBC
Iron: 60 ug/dL (ref 28–170)
Saturation Ratios: 15 % (ref 10.4–31.8)
TIBC: 394 ug/dL (ref 250–450)
UIBC: 334 ug/dL

## 2022-09-24 LAB — CBC WITH DIFFERENTIAL/PLATELET
Abs Immature Granulocytes: 0.01 10*3/uL (ref 0.00–0.07)
Basophils Absolute: 0 10*3/uL (ref 0.0–0.1)
Basophils Relative: 1 %
Eosinophils Absolute: 0 10*3/uL (ref 0.0–0.5)
Eosinophils Relative: 1 %
HCT: 40.6 % (ref 36.0–46.0)
Hemoglobin: 13.5 g/dL (ref 12.0–15.0)
Immature Granulocytes: 0 %
Lymphocytes Relative: 27 %
Lymphs Abs: 1.1 10*3/uL (ref 0.7–4.0)
MCH: 31 pg (ref 26.0–34.0)
MCHC: 33.3 g/dL (ref 30.0–36.0)
MCV: 93.3 fL (ref 80.0–100.0)
Monocytes Absolute: 0.3 10*3/uL (ref 0.1–1.0)
Monocytes Relative: 7 %
Neutro Abs: 2.7 10*3/uL (ref 1.7–7.7)
Neutrophils Relative %: 64 %
Platelets: 78 10*3/uL — ABNORMAL LOW (ref 150–400)
RBC: 4.35 MIL/uL (ref 3.87–5.11)
RDW: 12.9 % (ref 11.5–15.5)
WBC: 4.2 10*3/uL (ref 4.0–10.5)
nRBC: 0 % (ref 0.0–0.2)

## 2022-09-24 LAB — VITAMIN B12: Vitamin B-12: 1330 pg/mL — ABNORMAL HIGH (ref 180–914)

## 2022-09-24 LAB — FERRITIN: Ferritin: 44 ng/mL (ref 11–307)

## 2022-09-26 ENCOUNTER — Other Ambulatory Visit (INDEPENDENT_AMBULATORY_CARE_PROVIDER_SITE_OTHER): Payer: Medicare PPO

## 2022-09-26 DIAGNOSIS — K7460 Unspecified cirrhosis of liver: Secondary | ICD-10-CM | POA: Diagnosis not present

## 2022-09-26 LAB — COMPREHENSIVE METABOLIC PANEL
ALT: 14 U/L (ref 0–35)
AST: 21 U/L (ref 0–37)
Albumin: 4.3 g/dL (ref 3.5–5.2)
Alkaline Phosphatase: 153 U/L — ABNORMAL HIGH (ref 39–117)
BUN: 12 mg/dL (ref 6–23)
CO2: 26 mEq/L (ref 19–32)
Calcium: 9.8 mg/dL (ref 8.4–10.5)
Chloride: 100 mEq/L (ref 96–112)
Creatinine, Ser: 0.71 mg/dL (ref 0.40–1.20)
GFR: 85.28 mL/min (ref >60.00)
Glucose, Bld: 85 mg/dL (ref 70–99)
Potassium: 4.7 mEq/L (ref 3.5–5.1)
Sodium: 134 mEq/L — ABNORMAL LOW (ref 135–145)
Total Bilirubin: 0.8 mg/dL (ref 0.2–1.2)
Total Protein: 7.5 g/dL (ref 6.0–8.3)

## 2022-09-26 LAB — PROTIME-INR
INR: 1.1 ratio — ABNORMAL HIGH (ref 0.8–1.0)
Prothrombin Time: 12.1 s (ref 9.6–13.1)

## 2022-09-26 LAB — METHYLMALONIC ACID, SERUM: Methylmalonic Acid, Quantitative: 172 nmol/L (ref 0–378)

## 2022-09-30 ENCOUNTER — Ambulatory Visit (HOSPITAL_COMMUNITY)
Admission: RE | Admit: 2022-09-30 | Discharge: 2022-09-30 | Disposition: A | Discharge status: Home / Self Care | Payer: Medicare PPO | Source: Ambulatory Visit | Attending: Gastroenterology | Admitting: Gastroenterology

## 2022-09-30 DIAGNOSIS — K7460 Unspecified cirrhosis of liver: Secondary | ICD-10-CM | POA: Insufficient documentation

## 2022-09-30 LAB — AFP TUMOR MARKER: AFP-Tumor Marker: 3.9 ng/mL

## 2022-09-30 NOTE — Progress Notes (Unsigned)
VIRTUAL VISIT via [facility]   I connected with Kelly Lewis  on 10/01/22 at  4:31 PM by telephone and verified that I am speaking with the correct person using two identifiers.  Location: Patient: Home Provider: [facility]   I discussed the limitations, risks, security and privacy concerns of performing an evaluation and management service by telephone and the availability of in person appointments. I also discussed with the patient that there may be a patient responsible charge related to this service. The patient expressed understanding and agreed to proceed.  REASON FOR VISIT:  Follow-up for thrombocytopenia (liver cirrhosis) and iron deficiency anemia   PRIOR THERAPY: None   CURRENT THERAPY: Observation  INTERVAL HISTORY:  Kelly Lewis is contacted today for follow-up of thrombocytopenia and iron deficiency anemia.  She was last seen in clinic by Tarri Abernethy PA-C on 03/29/2022.  At today's visit, she reports feeling fairly well.  She denies any recent hospitalizations or changes to her health.   She has not had any recent episodes of hematochezia, melena, epistaxis, hematuria, or hematemesis.  Chronic mild gum bleeding. She denies any easy bruising or petechial rash.  She has not had any B symptoms such as fever, chills, night sweats, or unintentional weight loss.  She is taking ferrous bisglycinate every other day and tolerating it fairly well.  She has been trying to eat iron rich foods.  She denies any pica, severe headaches, or restless legs.  She continues to be working hard to lose weight using both diet and exercise.  She enjoys doing water aerobics, which also helps with her arthritic knee pain.  Her weight has been stable since her last visit.  She has 100% energy and 100% appetite.      REVIEW OF SYSTEMS:   Review of Systems  Constitutional:  Positive for malaise/fatigue (Feels "worn out" some days). Negative for  chills, diaphoresis, fever and weight loss.  Respiratory:  Negative for cough and shortness of breath.   Cardiovascular:  Negative for chest pain and palpitations.  Gastrointestinal:  Negative for abdominal pain, blood in stool, melena, nausea and vomiting.  Genitourinary:  Positive for frequency.  Musculoskeletal:  Positive for joint pain (Bilateral knee pain).  Neurological:  Negative for dizziness and headaches.     PHYSICAL EXAM: (per limitations of virtual telephone visit)  The patient is alert and oriented x 3, exhibiting adequate mentation, good mood, and ability to speak in full sentences and execute sound judgement.  ASSESSMENT & PLAN:  1.  Thrombocytopenia & leukopenia - Review of past labs shows normal CBC in 2012, with onset of thrombocytopenia in 2018, which has been progressive since that time.  Most recent platelets (06/06/2021) were 79. 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  Negative H. pylori, hepatitis B, hepatitis C, HIV.  Nutritional panel unremarkable with normal B12, folate, methylmalonic acid, copper.  ANA and rheumatoid factor negative.  LDH normal. - No bleeding, bruising, petechial rash      - No unexplained fever, chills, night sweats, weight loss.  No new lumps or bumps.       - No lymphadenopathy or hepatosplenomegaly palpated on exam   -  Most recent CBC (09/24/2022): Platelets 78, WBC 4.2 with normal differential.  Blood counts at baseline. - Differential diagnosis favors thrombocytopenia and leukopenia secondary to cirrhosis and splenic sequestration.  If worsening unexplained abnormalities in CBC, would  consider bone marrow biopsy for further work-up. - PLAN: Platelets and WBC is at baseline.  No indication for treatment at this time, but would consider further treatment if platelets were persistently less than 30.   - Repeat CBC and RTC in 6 months.   2.  Iron deficiency without anemia - Iron panel (03/21/2022): Ferritin 22, iron saturation 32% with normal Hgb 12.6/MCV 92.1 - No signs or symptoms of GI bleeding apart from occasional hemorrhoidal bleeding - EGD (11/15/2021): Portal hypertensive gastropathy - Colonoscopy (11/15/2021): Polyps x 3, mild diverticulosis, external hemorrhoids - She was unable to tolerate ferrous sulfate due to constipation and abdominal cramping - She is taking ferrous bisglycinate every other day, tolerating it much better than ferrous sulfate. - She is also taking B12 1000 mcg daily to help her energy, although her B12/MMA levels have been WNL - Most recent labs (09/24/2022): Ferritin 42, iron saturation 15% - PLAN: We will hold off on IV iron, since she is asymptomatic and her iron levels are improving on oral iron supplementation. - Repeat CBC and iron/B12 panel at follow-up visit in 6 months    3.   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  Radiology recommendations for follow-up renal sonogram in 6 months to assess stability. - Renal ultrasound (02/11/2022): Stable 8 mm hyperechoic focus in the midpole cortex of right kidney which may represent angiomyolipoma.  Follow-up ultrasound in 12 months recommended to ensure stability. - PLAN: Scheduled for renal ultrasound in June 2024   4.  Other history - PMH: Osteoarthritis, hypothyroidism, hypertension, anxiety, GERD.  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 Fuller [facility]. -  SOCIAL: Retired Pharmacist, hospital.  Lives with her husband.  No tobacco, alcohol, drugs. - FAMILY: Father with NASH.  Father and sister with melanoma.  PLAN SUMMARY: >> Scheduled for renal ultrasound on 02/03/2023 >> Labs in 6 months (CBC/D, CMP, ferritin, iron/TIBC, B12, MMA) >> OFFICE visit 1 week after labs     I discussed the assessment and treatment plan with the patient. The patient was provided an opportunity to ask questions and all were answered. The patient agreed with the plan and demonstrated an understanding of the instructions.   The patient was advised to call back or seek an in-person evaluation if the symptoms worsen or if the condition fails to improve as anticipated.  I provided 22 minutes of non-face-to-face time during this encounter.   Harriett Rush, PA-C 10/01/22 5:01 PM

## 2022-10-01 ENCOUNTER — Inpatient Hospital Stay (HOSPITAL_BASED_OUTPATIENT_CLINIC_OR_DEPARTMENT_OTHER): Payer: Medicare PPO | Admitting: Physician Assistant

## 2022-10-01 ENCOUNTER — Encounter: Payer: Self-pay | Admitting: Physician Assistant

## 2022-10-01 DIAGNOSIS — D696 Thrombocytopenia, unspecified: Secondary | ICD-10-CM | POA: Diagnosis not present

## 2022-10-01 DIAGNOSIS — E611 Iron deficiency: Secondary | ICD-10-CM | POA: Diagnosis not present

## 2022-10-01 DIAGNOSIS — N289 Disorder of kidney and ureter, unspecified: Secondary | ICD-10-CM

## 2022-10-02 ENCOUNTER — Other Ambulatory Visit: Payer: Self-pay

## 2022-10-02 DIAGNOSIS — E611 Iron deficiency: Secondary | ICD-10-CM

## 2022-10-02 DIAGNOSIS — D696 Thrombocytopenia, unspecified: Secondary | ICD-10-CM

## 2022-10-04 DIAGNOSIS — Z1231 Encounter for screening mammogram for malignant neoplasm of breast: Secondary | ICD-10-CM | POA: Diagnosis not present

## 2022-10-22 ENCOUNTER — Encounter: Payer: Self-pay | Admitting: Nurse Practitioner

## 2022-12-26 ENCOUNTER — Other Ambulatory Visit: Payer: Self-pay | Admitting: Nurse Practitioner

## 2022-12-27 ENCOUNTER — Ambulatory Visit: Payer: Medicare PPO | Admitting: Nurse Practitioner

## 2022-12-31 ENCOUNTER — Encounter: Payer: Self-pay | Admitting: Nurse Practitioner

## 2022-12-31 ENCOUNTER — Ambulatory Visit (INDEPENDENT_AMBULATORY_CARE_PROVIDER_SITE_OTHER): Payer: Medicare PPO | Admitting: Nurse Practitioner

## 2022-12-31 VITALS — BP 137/76 | HR 82 | Temp 97.3°F | Resp 20 | Ht 64.0 in | Wt 204.0 lb

## 2022-12-31 DIAGNOSIS — I10 Essential (primary) hypertension: Secondary | ICD-10-CM | POA: Diagnosis not present

## 2022-12-31 DIAGNOSIS — Z6834 Body mass index (BMI) 34.0-34.9, adult: Secondary | ICD-10-CM | POA: Diagnosis not present

## 2022-12-31 DIAGNOSIS — E039 Hypothyroidism, unspecified: Secondary | ICD-10-CM | POA: Diagnosis not present

## 2022-12-31 DIAGNOSIS — F411 Generalized anxiety disorder: Secondary | ICD-10-CM

## 2022-12-31 DIAGNOSIS — M79604 Pain in right leg: Secondary | ICD-10-CM | POA: Diagnosis not present

## 2022-12-31 DIAGNOSIS — D696 Thrombocytopenia, unspecified: Secondary | ICD-10-CM

## 2022-12-31 DIAGNOSIS — K219 Gastro-esophageal reflux disease without esophagitis: Secondary | ICD-10-CM | POA: Diagnosis not present

## 2022-12-31 MED ORDER — LORAZEPAM 0.5 MG PO TABS: 0.5000 mg | ORAL_TABLET | Freq: Two times a day (BID) | ORAL | 5 refills | Status: DC | PRN

## 2022-12-31 MED ORDER — LISINOPRIL 10 MG PO TABS: 10.0000 mg | ORAL_TABLET | Freq: Every day | ORAL | 1 refills | Status: DC

## 2022-12-31 MED ORDER — GABAPENTIN 100 MG PO CAPS: 100.0000 mg | ORAL_CAPSULE | Freq: Three times a day (TID) | ORAL | 3 refills | Status: DC

## 2022-12-31 NOTE — Addendum Note (Signed)
Addended by: Cleda Daub on: 12/31/2022 12:55 PM   Modules accepted: Orders

## 2022-12-31 NOTE — Patient Instructions (Signed)
Leg Cramps Leg cramps occur when one or more muscles tighten and a person has no control over it (involuntary muscle contraction). Muscle cramps are most common in the calf muscles of the leg. They can occur during exercise or at rest. Leg cramps are painful, and they may last for a few seconds to a few minutes. Cramps may return several times before they finally stop. Usually, leg cramps are not caused by a serious medical problem. In many cases, the cause is not known. Some common causes include: Excessive physical effort (overexertion), such as during intense exercise. Doing the same motion over and over. Staying in a certain position for a long period of time. Improper preparation, form, or technique while doing a sport or an activity. Dehydration. Injury. Side effects of certain medicines. Abnormally low levels of minerals in your blood (electrolytes), especially potassium and calcium. This could result from: Pregnancy. Taking diuretic medicines. Follow these instructions at home: Eating and drinking Drink enough fluid to keep your urine pale yellow. Staying hydrated may help prevent cramps. Eat a healthy diet that includes plenty of nutrients to help your muscles function. A healthy diet includes fruits and vegetables, lean protein, whole grains, and low-fat or nonfat dairy products. Managing pain, stiffness, and swelling     Try massaging, stretching, and relaxing the affected muscle. Do this for several minutes at a time. If directed, put ice on areas that are sore or painful after a cramp. To do this: Put ice in a plastic bag. Place a towel between your skin and the bag. Leave the ice on for 20 minutes, 2-3 times a day. Remove the ice if your skin turns bright red. This is very important. If you cannot feel pain, heat, or cold, you have a greater risk of damage to the area. If directed, apply heat to muscles that are tense or tight. Do this before you exercise, or as often as told  by your health care provider. Use the heat source that your health care provider recommends, such as a moist heat pack or a heating pad. To do this: Place a towel between your skin and the heat source. Leave the heat on for 20-30 minutes. Remove the heat if your skin turns bright red. This is especially important if you are unable to feel pain, heat, or cold. You may have a greater risk of getting burned. Try taking hot showers or baths to help relax tight muscles. General instructions If you are having frequent leg cramps, avoid intense exercise for several days. Take over-the-counter and prescription medicines only as told by your health care provider. Keep all follow-up visits. This is important. Contact a health care provider if: Your leg cramps get more severe or more frequent, or they do not improve over time. Your foot becomes cold, numb, or blue. Summary Muscle cramps can develop in any muscle, but the most common place is in the calf muscles of the leg. Leg cramps are painful, and they may last for a few seconds to a few minutes. Usually, leg cramps are not caused by a serious medical problem. Often, the cause is not known. Stay hydrated, and take over-the-counter and prescription medicines only as told by your health care provider. This information is not intended to replace advice given to you by your health care provider. Make sure you discuss any questions you have with your health care provider. Document Revised: 01/05/2020 Document Reviewed: 01/05/2020 Elsevier Patient Education  2023 Elsevier Inc.  

## 2022-12-31 NOTE — Progress Notes (Signed)
Subjective:    Patient ID: Kelly Lewis, female    DOB: [birth year], 72 y.o.   MRN: [id number]   Chief Complaint: medical management of chronic issues     HPI:  Kelly Lewis is a 72 y.o. who identifies as a female who was assigned female at birth.   Social history: Lives with: husband Work history: retired   Water engineer in today for follow up of the following chronic medical issues:  1. Primary hypertension No c/o chest pain, sob or headache. Does not check blood pressure at home. BP Readings from Last 3 Encounters:  06/28/22 130/67  04/25/22 126/71  04/11/22 139/75     2. Gastroesophageal reflux disease without esophagitis On no prescription meds. Uses OTC meds when needed.  3. Acquired hypothyroidism No issues that she is aware of Lab Results  Component Value Date   TSH 1.530 09/03/2022      4. Thrombocytopenia (HCC) Lab Results  Component Value Date   PLT 78 (L) 09/24/2022     5. GAD (generalized anxiety disorder) Is on ativan on an as needed basis    12/31/2022   10:59 AM 06/28/2022   11:29 AM 04/25/2022    8:57 AM 04/11/2022   10:06 AM  GAD 7 : Generalized Anxiety Score  Nervous, Anxious, on Edge 0 1 0 1  Control/stop worrying 0 0 0 0  Worry too much - different things 0 0 0 0  Trouble relaxing 0 0 0 0  Restless 0 0 0 0  Easily annoyed or irritable 0 0 0 0  Afraid - awful might happen 0 0 0 0  Total GAD 7 Score 0 1 0 1  Anxiety Difficulty Not difficult at all Not difficult at all Not difficult at all Not difficult at all      6. BMI 34.0-34.9,adult weight is up 6lbs. Exercises 3x a week in pool at [facility] Readings from Last 3 Encounters:  12/31/22 204 lb (92.5 kg)  06/28/22 198 lb (89.8 kg)  04/25/22 196 lb (88.9 kg)   BMI Readings from Last 3 Encounters:  12/31/22 35.02 kg/m  06/28/22 33.99 kg/m  04/25/22 33.64 kg/m     New complaints: Leg pain- aches all the time. 8-9/10 at night. Motrin helps some but she has to watch taking  that because of her platelets. This has been going on for years.  Allergies  Allergen Reactions   Amoxicillin-Pot Clavulanate Other (See Comments)    Urinary retention   Cephalexin Nausea And Vomiting    Internal head pressure and nausea   Doxycycline Nausea And Vomiting    Gi and internal head pressure   Nitrofurantoin Nausea And Vomiting    Gi upset and internal head pressure   Ciprofloxacin Hcl Nausea Only and Other (See Comments)    Abdominal pain, anxiety, facial rash   Naprosyn [Naproxen]     gerd   Other Other (See Comments)    Family History of Reaction   Shingrix [Zoster Vac Recomb Adjuvanted]     Family History of Reaction   Sulfonamide Derivatives     REACTION: skin turns red   Xylocaine [Lidocaine Hcl]     Real red, hot and flushed all over. Pt reports the redness was from being nervous and wasn't actually a reaction.    Outpatient Encounter Medications as of 12/31/2022  Medication Sig   Cholecalciferol (VITAMIN D3) 2000 UNITS TABS Take 2,000 Units by mouth daily.    Cranberry 300 MG tablet Take  300 mg by mouth daily. Utiva supplement , cranberry extract 36 PAC   Fe Bisgly-Vit C-Vit B12-FA (GENTLE IRON PO) Take by mouth every other day.   ibuprofen (ADVIL,MOTRIN) 200 MG tablet Take 200 mg by mouth every 6 (six) hours as needed for mild pain.   levothyroxine (SYNTHROID) 88 MCG tablet TAKE 1 TABLET BY MOUTH EVERY DAY   lisinopril (ZESTRIL) 10 MG tablet Take 1 tablet (10 mg total) by mouth daily.   LORazepam (ATIVAN) 0.5 MG tablet Take 1 tablet (0.5 mg total) by mouth 2 (two) times daily as needed for anxiety.   ondansetron (ZOFRAN) 4 MG tablet Take 1 tablet (4 mg total) by mouth 2 (two) times daily as needed for nausea or vomiting.   Probiotic Product (ALIGN) 4 MG CAPS Take by mouth.   vitamin B-12 (CYANOCOBALAMIN) 1000 MCG tablet Take 1,000 mcg by mouth daily.   vitamin C (ASCORBIC ACID) 500 MG tablet Take 500 mg by mouth daily.   Facility-Administered Encounter  Medications as of 12/31/2022  Medication   0.9 %  sodium chloride infusion    Past Surgical History:  Procedure Laterality Date   bladder tacked  1984   COLONOSCOPY  2011   1- TA   UPPER GASTROINTESTINAL ENDOSCOPY  2012   at [place], normal    Family History  Problem Relation Age of Onset   Colon polyps Mother    Diabetes Mother    Pneumonia Father    Cancer Father        skin   Colon polyps Sister    Cancer Sister        skin   Colon cancer Neg Hx    Esophageal cancer Neg Hx    Rectal cancer Neg Hx    Stomach cancer Neg Hx       Controlled substance contract: 12/31/22 drug screen today     Review of Systems  Constitutional:  Negative for diaphoresis.  Eyes:  Negative for pain.  Respiratory:  Negative for shortness of breath.   Cardiovascular:  Negative for chest pain, palpitations and leg swelling.  Gastrointestinal:  Negative for abdominal pain.  Endocrine: Negative for polydipsia.  Skin:  Negative for rash.  Neurological:  Negative for dizziness, weakness and headaches.  Hematological:  Does not bruise/bleed easily.  All other systems reviewed and are negative.      Objective:   Physical Exam Vitals and nursing note reviewed.  Constitutional:      General: She is not in acute distress.    Appearance: Normal appearance. She is well-developed.  HENT:     Head: Normocephalic.     Right Ear: Tympanic membrane normal.     Left Ear: Tympanic membrane normal.     Nose: Nose normal.     Mouth/Throat:     Mouth: Mucous membranes are moist.  Eyes:     Pupils: Pupils are equal, round, and reactive to light.  Neck:     Vascular: No carotid bruit or JVD.  Cardiovascular:     Rate and Rhythm: Normal rate and regular rhythm.     Heart sounds: Normal heart sounds.  Pulmonary:     Effort: Pulmonary effort is normal. No respiratory distress.     Breath sounds: Normal breath sounds. No wheezing or rales.  Chest:     Chest wall: No tenderness.  Abdominal:      General: Bowel sounds are normal. There is no distension or abdominal bruit.     Palpations: Abdomen is soft. There  is no hepatomegaly, splenomegaly, mass or pulsatile mass.     Tenderness: There is no abdominal tenderness.  Musculoskeletal:        General: Normal range of motion.     Cervical back: Normal range of motion and neck supple.  Lymphadenopathy:     Cervical: No cervical adenopathy.  Skin:    General: Skin is warm and dry.  Neurological:     Mental Status: She is alert and oriented to person, place, and time.     Deep Tendon Reflexes: Reflexes are normal and symmetric.  Psychiatric:        Behavior: Behavior normal.        Thought Content: Thought content normal.        Judgment: Judgment normal.    BP 137/76   Pulse 82   Temp (!) 97.3 F (36.3 C) (Temporal)   Resp 20   Ht 5\' 4"  (1.626 m)   Wt 204 lb (92.5 kg)   SpO2 98%   BMI 35.02 kg/m         Assessment & Plan:  LILAH MIJANGOS comes in today with chief complaint of Medical Management of Chronic Issues (Right leg aching. Would like to try gabapentin)   Diagnosis and orders addressed:  1. Primary hypertension Low sodium diet - lisinopril (ZESTRIL) 10 MG tablet; Take 1 tablet (10 mg total) by mouth daily.  Dispense: 90 tablet; Refill: 1 - CBC with Differential/Platelet - CMP14+EGFR - Lipid panel  2. Gastroesophageal reflux disease without esophagitis Avoid spicy foods Do not eat 2 hours prior to bedtime   3. Acquired hypothyroidism Labs pending - Thyroid Panel With TSH  4. Thrombocytopenia (HCC) Labs pending  5. GAD (generalized anxiety disorder) Stress management - LORazepam (ATIVAN) 0.5 MG tablet; Take 1 tablet (0.5 mg total) by mouth 2 (two) times daily as needed for anxiety.  Dispense: 60 tablet; Refill: 5  6. BMI 34.0-34.9,adult Discussed diet and exercise for person with BMI >25 Will recheck weight in 3-6 months   7. Right leg pain Moist heat' stretches - gabapentin (NEURONTIN)  100 MG capsule; Take 1 capsule (100 mg total) by mouth 3 (three) times daily.  Dispense: 90 capsule; Refill: 3   Labs pending Health Maintenance reviewed Diet and exercise encouraged  Follow up plan: 6 months   Mary-Margaret Daphine Deutscher, FNP

## 2023-01-01 LAB — LIPID PANEL: Cholesterol, Total: 200 mg/dL — ABNORMAL HIGH (ref 100–199)

## 2023-01-01 LAB — CBC WITH DIFFERENTIAL/PLATELET

## 2023-01-01 LAB — CMP14+EGFR
ALT: 13 IU/L (ref 0–32)
Alkaline Phosphatase: 160 IU/L — ABNORMAL HIGH (ref 44–121)
Glucose: 98 mg/dL (ref 70–99)
Potassium: 4.5 mmol/L (ref 3.5–5.2)
Sodium: 135 mmol/L (ref 134–144)

## 2023-01-01 LAB — THYROID PANEL WITH TSH: Free Thyroxine Index: 2.3 (ref 1.2–4.9)

## 2023-01-02 LAB — CMP14+EGFR
AST: 23 IU/L (ref 0–40)
Albumin/Globulin Ratio: 1.7 (ref 1.2–2.2)
Albumin: 4.5 g/dL (ref 3.8–4.8)
BUN/Creatinine Ratio: 13 (ref 12–28)
BUN: 10 mg/dL (ref 8–27)
Bilirubin Total: 0.7 mg/dL (ref 0.0–1.2)
CO2: 19 mmol/L — ABNORMAL LOW (ref 20–29)
Calcium: 9.8 mg/dL (ref 8.7–10.3)
Chloride: 99 mmol/L (ref 96–106)
Creatinine, Ser: 0.8 mg/dL (ref 0.57–1.00)
Globulin, Total: 2.6 g/dL (ref 1.5–4.5)
Total Protein: 7.1 g/dL (ref 6.0–8.5)
eGFR: 78 mL/min/{1.73_m2} (ref >59)

## 2023-01-02 LAB — CBC WITH DIFFERENTIAL/PLATELET
Basophils Absolute: 0 10*3/uL (ref 0.0–0.2)
Basos: 1 %
EOS (ABSOLUTE): 0 10*3/uL (ref 0.0–0.4)
Eos: 0 %
Hematocrit: 41.8 % (ref 34.0–46.6)
Immature Grans (Abs): 0 10*3/uL (ref 0.0–0.1)
Lymphocytes Absolute: 1.2 10*3/uL (ref 0.7–3.1)
Lymphs: 25 %
MCHC: 33.5 g/dL (ref 31.5–35.7)
Monocytes Absolute: 0.3 10*3/uL (ref 0.1–0.9)
Monocytes: 6 %
Neutrophils Absolute: 3.2 10*3/uL (ref 1.4–7.0)
Platelets: 86 10*3/uL — CL (ref 150–450)
RDW: 12.9 % (ref 11.7–15.4)

## 2023-01-02 LAB — LIPID PANEL
Chol/HDL Ratio: 2.9 ratio (ref 0.0–4.4)
HDL: 70 mg/dL (ref >39)
LDL Chol Calc (NIH): 114 mg/dL — ABNORMAL HIGH (ref 0–99)
Triglycerides: 87 mg/dL (ref 0–149)
VLDL Cholesterol Cal: 16 mg/dL (ref 5–40)

## 2023-01-02 LAB — THYROID PANEL WITH TSH
T3 Uptake Ratio: 21 % — ABNORMAL LOW (ref 24–39)
T4, Total: 11 ug/dL (ref 4.5–12.0)
TSH: 1.57 u[IU]/mL (ref 0.450–4.500)

## 2023-01-03 LAB — TOXASSURE SELECT 13 (MW), URINE

## 2023-02-03 ENCOUNTER — Encounter (HOSPITAL_COMMUNITY): Payer: Self-pay

## 2023-02-03 ENCOUNTER — Ambulatory Visit (HOSPITAL_COMMUNITY): Payer: Medicare PPO | Attending: Physician Assistant

## 2023-03-27 ENCOUNTER — Telehealth: Payer: Self-pay

## 2023-03-27 ENCOUNTER — Other Ambulatory Visit: Payer: Self-pay

## 2023-03-27 DIAGNOSIS — K7460 Unspecified cirrhosis of liver: Secondary | ICD-10-CM

## 2023-03-27 NOTE — Telephone Encounter (Signed)
The pt has been advised and will come in for labs and await a call from the schedulers to set up [place]

## 2023-03-27 NOTE — Telephone Encounter (Signed)
-----   Message from Nurse Wilson Singer sent at 03/27/2023  8:44 AM EDT -----  ----- Message ----- From: Loretha Stapler, RN Sent: 03/27/2023  12:00 AM EDT To: Lucky Cowboy, RN  Repeat CBC, CMP, PT/INR, AFP in 6 months (see 09/26/22 results note)

## 2023-03-31 ENCOUNTER — Other Ambulatory Visit: Payer: Self-pay

## 2023-03-31 ENCOUNTER — Other Ambulatory Visit (INDEPENDENT_AMBULATORY_CARE_PROVIDER_SITE_OTHER): Payer: Medicare PPO

## 2023-03-31 DIAGNOSIS — K7460 Unspecified cirrhosis of liver: Secondary | ICD-10-CM | POA: Diagnosis not present

## 2023-03-31 LAB — COMPREHENSIVE METABOLIC PANEL
ALT: 16 U/L (ref 0–35)
AST: 24 U/L (ref 0–37)
Albumin: 4.5 g/dL (ref 3.5–5.2)
Alkaline Phosphatase: 164 U/L — ABNORMAL HIGH (ref 39–117)
BUN: 8 mg/dL (ref 6–23)
CO2: 24 mEq/L (ref 19–32)
Calcium: 10.2 mg/dL (ref 8.4–10.5)
Chloride: 99 mEq/L (ref 96–112)
Creatinine, Ser: 0.82 mg/dL (ref 0.40–1.20)
GFR: 71.48 mL/min (ref >60.00)
Glucose, Bld: 111 mg/dL — ABNORMAL HIGH (ref 70–99)
Potassium: 4.2 mEq/L (ref 3.5–5.1)
Sodium: 132 mEq/L — ABNORMAL LOW (ref 135–145)
Total Bilirubin: 0.9 mg/dL (ref 0.2–1.2)
Total Protein: 7.7 g/dL (ref 6.0–8.3)

## 2023-03-31 LAB — CBC WITH DIFFERENTIAL/PLATELET
Basophils Absolute: 0 10*3/uL (ref 0.0–0.1)
Basophils Relative: 0.5 % (ref 0.0–3.0)
Eosinophils Absolute: 0 10*3/uL (ref 0.0–0.7)
Eosinophils Relative: 0.5 % (ref 0.0–5.0)
HCT: 42.3 % (ref 36.0–46.0)
Hemoglobin: 13.8 g/dL (ref 12.0–15.0)
Lymphocytes Relative: 18 % (ref 12.0–46.0)
Lymphs Abs: 0.8 10*3/uL (ref 0.7–4.0)
MCHC: 32.7 g/dL (ref 30.0–36.0)
MCV: 91.5 fl (ref 78.0–100.0)
Monocytes Absolute: 0.2 10*3/uL (ref 0.1–1.0)
Monocytes Relative: 5.2 % (ref 3.0–12.0)
Neutro Abs: 3.2 10*3/uL (ref 1.4–7.7)
Neutrophils Relative %: 75.8 % (ref 43.0–77.0)
Platelets: 79 10*3/uL — ABNORMAL LOW (ref 150.0–400.0)
RBC: 4.63 Mil/uL (ref 3.87–5.11)
RDW: 14 % (ref 11.5–15.5)
WBC: 4.2 10*3/uL (ref 4.0–10.5)

## 2023-03-31 LAB — PROTIME-INR
INR: 1.2 ratio — ABNORMAL HIGH (ref 0.8–1.0)
Prothrombin Time: 12.3 s (ref 9.6–13.1)

## 2023-04-02 ENCOUNTER — Inpatient Hospital Stay: Payer: Medicare PPO

## 2023-04-02 ENCOUNTER — Inpatient Hospital Stay: Payer: Medicare PPO | Attending: Hematology

## 2023-04-02 DIAGNOSIS — E611 Iron deficiency: Secondary | ICD-10-CM | POA: Diagnosis not present

## 2023-04-02 DIAGNOSIS — D696 Thrombocytopenia, unspecified: Secondary | ICD-10-CM | POA: Diagnosis not present

## 2023-04-02 LAB — CBC WITH DIFFERENTIAL/PLATELET
Abs Immature Granulocytes: 0.01 10*3/uL (ref 0.00–0.07)
Basophils Absolute: 0 10*3/uL (ref 0.0–0.1)
Basophils Relative: 1 %
Eosinophils Absolute: 0 10*3/uL (ref 0.0–0.5)
Eosinophils Relative: 0 %
HCT: 41.1 % (ref 36.0–46.0)
Hemoglobin: 13.5 g/dL (ref 12.0–15.0)
Immature Granulocytes: 0 %
Lymphocytes Relative: 20 %
Lymphs Abs: 1 10*3/uL (ref 0.7–4.0)
MCH: 30.2 pg (ref 26.0–34.0)
MCHC: 32.8 g/dL (ref 30.0–36.0)
MCV: 91.9 fL (ref 80.0–100.0)
Monocytes Absolute: 0.3 10*3/uL (ref 0.1–1.0)
Monocytes Relative: 7 %
Neutro Abs: 3.6 10*3/uL (ref 1.7–7.7)
Neutrophils Relative %: 72 %
Platelets: 80 10*3/uL — ABNORMAL LOW (ref 150–400)
RBC: 4.47 MIL/uL (ref 3.87–5.11)
RDW: 13.1 % (ref 11.5–15.5)
WBC: 4.9 10*3/uL (ref 4.0–10.5)
nRBC: 0 % (ref 0.0–0.2)

## 2023-04-02 LAB — COMPREHENSIVE METABOLIC PANEL
ALT: 19 U/L (ref 0–44)
AST: 27 U/L (ref 15–41)
Albumin: 4 g/dL (ref 3.5–5.0)
Alkaline Phosphatase: 147 U/L — ABNORMAL HIGH (ref 38–126)
Anion gap: 8 (ref 5–15)
BUN: 10 mg/dL (ref 8–23)
CO2: 24 mmol/L (ref 22–32)
Calcium: 9.4 mg/dL (ref 8.9–10.3)
Chloride: 101 mmol/L (ref 98–111)
Creatinine, Ser: 0.88 mg/dL (ref 0.44–1.00)
GFR, Estimated: >60 mL/min (ref >60)
Glucose, Bld: 100 mg/dL — ABNORMAL HIGH (ref 70–99)
Potassium: 4 mmol/L (ref 3.5–5.1)
Sodium: 133 mmol/L — ABNORMAL LOW (ref 135–145)
Total Bilirubin: 0.9 mg/dL (ref 0.3–1.2)
Total Protein: 7.5 g/dL (ref 6.5–8.1)

## 2023-04-02 LAB — IRON AND TIBC
Iron: 98 ug/dL (ref 28–170)
Saturation Ratios: 25 % (ref 10.4–31.8)
TIBC: 386 ug/dL (ref 250–450)
UIBC: 288 ug/dL

## 2023-04-02 LAB — FERRITIN: Ferritin: 56 ng/mL (ref 11–307)

## 2023-04-02 LAB — VITAMIN B12: Vitamin B-12: 1182 pg/mL — ABNORMAL HIGH (ref 180–914)

## 2023-04-08 NOTE — Progress Notes (Addendum)
[facility] [address], [place] [address]   CLINIC:  Medical Oncology/Hematology  PCP:  Bennie Pierini, FNP [address] [place] [address] [phone]   REASON FOR VISIT:  Follow-up for thrombocytopenia (liver cirrhosis) and iron deficiency anemia   PRIOR THERAPY: None   CURRENT THERAPY: Observation  INTERVAL HISTORY:   Kelly Lewis 72 y.o. female returns for routine follow-up of thrombocytopenia and iron deficiency anemia.  She was last evaluated via telemedicine visit by Rojelio Brenner PA-C on 10/01/2022.  At today's visit, she reports feeling fairly well.  No recent hospitalizations, surgeries, or changes in baseline health status.  She has not had any recent episodes of hematochezia, melena, epistaxis, hematuria, or hematemesis. She has occasional mild gum bleeding. She denies any easy bruising or petechial rash.  She has not had any B symptoms such as fever, chills, night sweats, or unintentional weight loss.  She was unable to tolerate ferrous sulfate or ferrous bisglycinate due to upset stomach. She has been trying to eat iron rich foods. She denies any pica, severe headaches, or restless legs.   Her weight has been stable since her last visit.  She has 85% energy and 100% appetite.     ASSESSMENT & PLAN:  1.  Thrombocytopenia & leukopenia - Review of past labs shows normal CBC in 2012, with onset of thrombocytopenia in 2018, which has been progressive since that time.  Most recent platelets (06/06/2021) were 79. 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  Negative H. pylori, hepatitis B, hepatitis C, HIV.  Nutritional panel unremarkable with normal B12, folate, methylmalonic acid, copper.  ANA and rheumatoid factor negative.  LDH normal. - No bleeding, bruising, petechial rash      - No unexplained fever, chills, night sweats, weight loss.  No new lumps or bumps.       - No lymphadenopathy or hepatosplenomegaly palpated on exam   - Most recent CBC (04/02/2023): Platelets 80, WBC 4.9 with normal differential.  Blood counts at baseline. - Differential diagnosis favors thrombocytopenia and leukopenia secondary to cirrhosis and splenic sequestration.  If worsening unexplained abnormalities in CBC, would consider bone marrow biopsy for further work-up. - PLAN: Platelets and WBC is at baseline.  No indication for treatment at this time, but would consider further treatment if platelets were persistently less than 30.   - Repeat CBC and RTC in 6 months.   2.  Iron deficiency without anemia - Iron panel (03/21/2022): Ferritin 22, iron saturation 32% with normal Hgb 12.6/MCV 92.1 - No signs or symptoms of GI bleeding apart from occasional hemorrhoidal bleeding - EGD (11/15/2021): Portal hypertensive gastropathy - Colonoscopy (11/15/2021): Polyps x 3, mild diverticulosis, external hemorrhoids - Unable to tolerate ferrous sulfate or ferrous gluconate due to constipation and abdominal cramping - She is also taking B12 1000 mcg daily to help her energy, although her B12/MMA levels have been WNL - She takes omeprazole daily  - Most recent labs (04/02/2023): Hgb 13.5, ferritin 56, iron saturation 25%.  B12 1182, MMA pending . - PLAN: We will hold off on IV iron, since she is asymptomatic  and without anemia - Repeat CBC and iron panel in 6 months.  Will recheck B12/MMA in 1 year.  3.   Incidental finding: Kidney lesion - Abdominal ultrasound on 07/09/2021 revealed incidental finding of 8 mm  hyperechoic focus in the midportion of the right kidney, possibly angiomyolipoma, which was not present on previous study (from March 2012).  Radiology recommendations for follow-up renal sonogram in 6 months to assess stability. - Renal ultrasound (02/11/2022): Stable 8 mm hyperechoic focus in the midpole cortex of right kidney which may represent angiomyolipoma.  Follow-up ultrasound in 12 months recommended to ensure stability. - Abdominal US today (04/09/23): Right kidney shows no evidence of mass or hydronephrosis.  She does have 6 mm stone identified.   4.  Other history - PMH: Osteoarthritis, hypothyroidism, hypertension, anxiety, GERD.  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 Russella Dar of [facility]. - SOCIAL: Retired Runner, broadcasting/film/video.  Lives with her husband.  No tobacco, alcohol, drugs. - FAMILY: Father with NASH.  Father and sister with melanoma.  PLAN SUMMARY: >> Labs in 6 months = CBC/D, CMP, ferritin, iron/TIBC >> OFFICE visit in 6 months (1 week after labs)     REVIEW OF SYSTEMS:   Review of Systems  Constitutional:  Positive for fatigue. Negative for appetite change, chills, diaphoresis, fever and unexpected weight change.  HENT:   Negative for lump/mass and nosebleeds.   Eyes:  Negative for eye problems.  Respiratory:  Positive for cough. Negative for hemoptysis and shortness of breath.   Cardiovascular:  Negative for chest pain, leg swelling and palpitations.  Gastrointestinal:  Positive for constipation. Negative for abdominal pain, blood in stool, diarrhea, nausea and vomiting.  Genitourinary:  Negative for hematuria.   Musculoskeletal:  Positive for arthralgias.  Skin: Negative.   Neurological:  Negative for dizziness, headaches and light-headedness.  Hematological:  Does not bruise/bleed easily.  Psychiatric/Behavioral:  The patient is nervous/anxious.      PHYSICAL EXAM:  ECOG PERFORMANCE STATUS: 1 - Symptomatic but completely  ambulatory  There were no vitals filed for this visit. There were no vitals filed for this visit. Physical Exam Constitutional:      Appearance: Normal appearance. She is obese.  Cardiovascular:     Heart sounds: Normal heart sounds.  Pulmonary:     Breath sounds: Normal breath sounds.  Neurological:     General: No focal deficit present.     Mental Status: Mental status is at baseline.  Psychiatric:        Behavior: Behavior normal. Behavior is cooperative.     PAST MEDICAL/SURGICAL HISTORY:  Past Medical History:  Diagnosis Date   Arthritis    Chronic anxiety    GERD (gastroesophageal reflux disease)    H/O degenerative disc disease    Heart murmur    Hyperlipidemia    Hypertension    Non-alcoholic fatty liver disease    Osteopenia 2021   Plantar fasciitis    Temporary low platelet count (HCC) 2022   94K then up to 104 in May of 2022   Thyroid disease    Past Surgical History:  Procedure Laterality Date   bladder tacked  1984   COLONOSCOPY  2011   1- TA   UPPER GASTROINTESTINAL ENDOSCOPY  2012   at [facility], normal    SOCIAL HISTORY:  Social History   Socioeconomic History   Marital status: Married    Spouse name: Chanetta Marshall   Number of children: 2   Years of education: Automotive engineer  Highest education level: Bachelor's degree (e.g., BA, AB, BS)  Occupational History   Occupation: Retired   Tobacco Use   Smoking status: Never    Passive exposure: Past (husband usually smokes outside or in vehicle)   Smokeless tobacco: Never  Vaping Use   Vaping status: Never Used  Substance and Sexual Activity   Alcohol use: No   Drug use: No   Sexual activity: Not Currently  Other Topics Concern   Not on file  Social History Narrative   Not on file   Social Determinants of Health   Financial Resource Strain: Low Risk  (12/27/2022)   Overall Financial Resource Strain (CARDIA)    Difficulty of Paying Living Expenses: Not hard at all  Food Insecurity: No Food Insecurity  (12/27/2022)   Hunger Vital Sign    Worried About Running Out of Food in the Last Year: Never true    Ran Out of Food in the Last Year: Never true  Transportation Needs: No Transportation Needs (12/27/2022)   PRAPARE - Administrator, Civil Service (Medical): No    Lack of Transportation (Non-Medical): No  Physical Activity: Sufficiently Active (12/27/2022)   Exercise Vital Sign    Days of Exercise per Week: 3 days    Minutes of Exercise per Session: 80 min  Stress: No Stress Concern Present (12/27/2022)   [employer] of Occupational Health - Occupational Stress Questionnaire    Feeling of Stress : Not at all  Social Connections: Socially Integrated (12/27/2022)   Social Connection and Isolation Panel [NHANES]    Frequency of Communication with Friends and Family: More than three times a week    Frequency of Social Gatherings with Friends and Family: More than three times a week    Attends Religious Services: More than 4 times per year    Active Member of [employer] or Organizations: Yes    Attends Banker Meetings: 1 to 4 times per year    Marital Status: Married  Catering manager Violence: Not At Risk (01/11/2019)   Humiliation, Afraid, Rape, and Kick questionnaire    Fear of Current or Ex-Partner: No    Emotionally Abused: No    Physically Abused: No    Sexually Abused: No    FAMILY HISTORY:  Family History  Problem Relation Age of Onset   Colon polyps Mother    Diabetes Mother    Pneumonia Father    Cancer Father        skin   Colon polyps Sister    Cancer Sister        skin   Colon cancer Neg Hx    Esophageal cancer Neg Hx    Rectal cancer Neg Hx    Stomach cancer Neg Hx     CURRENT MEDICATIONS:  Outpatient Encounter Medications as of 04/09/2023  Medication Sig   Cholecalciferol (VITAMIN D3) 2000 UNITS TABS Take 2,000 Units by mouth daily.    Cranberry 300 MG tablet Take 300 mg by mouth daily. Utiva supplement , cranberry extract 36 PAC   Fe  Bisgly-Vit C-Vit B12-FA (GENTLE IRON PO) Take by mouth every other day.   gabapentin (NEURONTIN) 100 MG capsule Take 1 capsule (100 mg total) by mouth 3 (three) times daily.   ibuprofen (ADVIL,MOTRIN) 200 MG tablet Take 200 mg by mouth every 6 (six) hours as needed for mild pain.   levothyroxine (SYNTHROID) 88 MCG tablet TAKE 1 TABLET BY MOUTH EVERY DAY   lisinopril (ZESTRIL) 10 MG tablet Take  1 tablet (10 mg total) by mouth daily.   LORazepam (ATIVAN) 0.5 MG tablet Take 1 tablet (0.5 mg total) by mouth 2 (two) times daily as needed for anxiety.   ondansetron (ZOFRAN) 4 MG tablet Take 1 tablet (4 mg total) by mouth 2 (two) times daily as needed for nausea or vomiting.   Probiotic Product (ALIGN) 4 MG CAPS Take by mouth.   vitamin B-12 (CYANOCOBALAMIN) 1000 MCG tablet Take 1,000 mcg by mouth daily.   vitamin C (ASCORBIC ACID) 500 MG tablet Take 500 mg by mouth daily.   Facility-Administered Encounter Medications as of 04/09/2023  Medication   0.9 %  sodium chloride infusion    ALLERGIES:  Allergies  Allergen Reactions   Amoxicillin-Pot Clavulanate Other (See Comments)    Urinary retention   Cephalexin Nausea And Vomiting    Internal head pressure and nausea   Doxycycline Nausea And Vomiting    Gi and internal head pressure   Nitrofurantoin Nausea And Vomiting    Gi upset and internal head pressure   Ciprofloxacin Hcl Nausea Only and Other (See Comments)    Abdominal pain, anxiety, facial rash   Naprosyn [Naproxen]     gerd   Other Other (See Comments)    Family History of Reaction   Shingrix [Zoster Vac Recomb Adjuvanted]     Family History of Reaction   Sulfonamide Derivatives     REACTION: skin turns red   Xylocaine [Lidocaine Hcl]     Real red, hot and flushed all over. Pt reports the redness was from being nervous and wasn't actually a reaction.     LABORATORY DATA:  I have reviewed the labs as listed.  CBC    Component Value Date/Time   WBC 4.9 04/02/2023 1240   RBC  4.47 04/02/2023 1240   HGB 13.5 04/02/2023 1240   HGB 14.0 12/31/2022 1124   HCT 41.1 04/02/2023 1240   HCT 41.8 12/31/2022 1124   PLT 80 (L) 04/02/2023 1240   PLT 86 (LL) 12/31/2022 1124   MCV 91.9 04/02/2023 1240   MCV 91 12/31/2022 1124   MCH 30.2 04/02/2023 1240   MCHC 32.8 04/02/2023 1240   RDW 13.1 04/02/2023 1240   RDW 12.9 12/31/2022 1124   LYMPHSABS 1.0 04/02/2023 1240   LYMPHSABS 1.2 12/31/2022 1124   MONOABS 0.3 04/02/2023 1240   EOSABS 0.0 04/02/2023 1240   EOSABS 0.0 12/31/2022 1124   BASOSABS 0.0 04/02/2023 1240   BASOSABS 0.0 12/31/2022 1124      Latest Ref Rng & Units 04/02/2023   12:40 PM 03/31/2023    9:17 AM 12/31/2022   11:24 AM  CMP  Glucose 70 - 99 mg/dL 784  696  98   BUN 8 - 23 mg/dL 10  8  10    Creatinine 0.44 - 1.00 mg/dL 2.95  2.84  1.32   Sodium 135 - 145 mmol/L 133  132  135   Potassium 3.5 - 5.1 mmol/L 4.0  4.2  4.5   Chloride 98 - 111 mmol/L 101  99  99   CO2 22 - 32 mmol/L 24  24  19    Calcium 8.9 - 10.3 mg/dL 9.4  44.0  9.8   Total Protein 6.5 - 8.1 g/dL 7.5  7.7  7.1   Total Bilirubin 0.3 - 1.2 mg/dL 0.9  0.9  0.7   Alkaline Phos 38 - 126 U/L 147  164  160   AST 15 - 41 U/L 27  24  23  ALT 0 - 44 U/L 19  16  13      DIAGNOSTIC IMAGING:  I have independently reviewed the relevant imaging and discussed with the patient.   WRAP UP:  All questions were answered. The patient knows to call the clinic with any problems, questions or concerns.  Medical decision making: Moderate  Time spent on visit: I spent 20 minutes counseling the patient face to face. The total time spent in the appointment was 30 minutes and more than 50% was on counseling.  Carnella Guadalajara, PA-C  04/09/23 10:28 AM

## 2023-04-09 ENCOUNTER — Inpatient Hospital Stay: Payer: Medicare PPO | Attending: Physician Assistant | Admitting: Physician Assistant

## 2023-04-09 ENCOUNTER — Ambulatory Visit (HOSPITAL_COMMUNITY)
Admission: RE | Admit: 2023-04-09 | Discharge: 2023-04-09 | Disposition: A | Discharge status: Home / Self Care | Payer: Medicare PPO | Source: Ambulatory Visit | Attending: Gastroenterology | Admitting: Gastroenterology

## 2023-04-09 VITALS — BP 160/72 | HR 87 | Temp 98.5°F | Resp 18 | Wt 199.5 lb

## 2023-04-09 DIAGNOSIS — D696 Thrombocytopenia, unspecified: Secondary | ICD-10-CM | POA: Diagnosis not present

## 2023-04-09 DIAGNOSIS — R5383 Other fatigue: Secondary | ICD-10-CM | POA: Insufficient documentation

## 2023-04-09 DIAGNOSIS — R161 Splenomegaly, not elsewhere classified: Secondary | ICD-10-CM | POA: Diagnosis not present

## 2023-04-09 DIAGNOSIS — N200 Calculus of kidney: Secondary | ICD-10-CM | POA: Diagnosis not present

## 2023-04-09 DIAGNOSIS — K7460 Unspecified cirrhosis of liver: Secondary | ICD-10-CM | POA: Diagnosis not present

## 2023-04-09 DIAGNOSIS — E611 Iron deficiency: Secondary | ICD-10-CM | POA: Insufficient documentation

## 2023-04-09 DIAGNOSIS — Z79899 Other long term (current) drug therapy: Secondary | ICD-10-CM | POA: Insufficient documentation

## 2023-04-09 NOTE — Patient Instructions (Signed)
[facility] at [facility] Discharge Instructions  You were seen today by Rojelio Brenner PA-C for your LOW PLATELETS and LOW IRON.  Your platelets are stable.  We will continue to monitor this.  Your iron levels are improved.  Continue to focus on iron-rich foods.  I   LABS: Return in 6 months for repeat labs   FOLLOW-UP APPOINTMENT: Office visit after labs   Thank you for choosing [facility] at [facility] to provide your oncology and hematology care.  To afford each patient quality time with our provider, please arrive at least 15 minutes before your scheduled appointment time.   If you have a lab appointment with the Cancer Center please come in thru the Main Entrance and check in at the main information desk.  You need to re-schedule your appointment should you arrive 10 or more minutes late.  We strive to give you quality time with our providers, and arriving late affects you and other patients whose appointments are after yours.  Also, if you no show three or more times for appointments you may be dismissed from the clinic at the providers discretion.     Again, thank you for choosing [facility]  Our hope is that these requests will decrease the amount of time that you wait before being seen by our physicians.       _____________________________________________________________  Should you have questions after your visit to [facility], please contact our office at [phone] and follow the prompts.  Our office hours are 8:00 a.m. and 4:30 p.m. Monday - Friday.  Please note that voicemails left after 4:00 p.m. may not be returned until the following business day.  We are closed weekends and major holidays.  You do have access to a nurse 24-7, just call the main number to the clinic [phone] and do not press any options, hold on the line and a nurse will answer the phone.    For prescription refill  requests, have your pharmacy contact our office and allow 72 hours.    Due to Covid, you will need to wear a mask upon entering the hospital. If you do not have a mask, a mask will be given to you at the Main Entrance upon arrival. For doctor visits, patients may have 1 support person age 59 or older with them. For treatment visits, patients can not have anyone with them due to social distancing guidelines and our immunocompromised population.

## 2023-04-10 ENCOUNTER — Telehealth: Payer: Self-pay

## 2023-04-10 NOTE — Telephone Encounter (Signed)
-----   Message from [place] T. Russella Dar sent at 04/10/2023  9:52 AM EDT ----- Regarding: FW: No focal hepatic lesions  Repeat RUQ [place] in 6 months ----- Message ----- From: Interface, Rad Results In Sent: 04/09/2023   8:47 AM EDT To: Meryl Dare, MD

## 2023-04-10 NOTE — Telephone Encounter (Signed)
I have notified the pt via My Chart  She does view her messages.  I will call the pt in 6 months for repeat US

## 2023-04-11 ENCOUNTER — Telehealth: Payer: Self-pay | Admitting: Physician Assistant

## 2023-04-11 NOTE — Telephone Encounter (Signed)
Patient called with questions regarding incidental finding of 6 mm renal stone in right kidney.  Discussed with patient that asymptomatic small kidney stones may not require any treatment and can be monitored with surveillance unless it grows, becomes symptomatic, or develops other complications.  She was advised to discuss this further with her PCP and/or consider urology referral, since this is outside of my particular area of expertise.  Carnella Guadalajara, PA-C  04/11/23 6:05 PM

## 2023-06-26 ENCOUNTER — Other Ambulatory Visit: Payer: Self-pay | Admitting: Nurse Practitioner

## 2023-06-27 ENCOUNTER — Ambulatory Visit: Payer: Medicare PPO | Admitting: Nurse Practitioner

## 2023-07-07 ENCOUNTER — Encounter: Payer: Self-pay | Admitting: Nurse Practitioner

## 2023-07-07 ENCOUNTER — Ambulatory Visit (INDEPENDENT_AMBULATORY_CARE_PROVIDER_SITE_OTHER): Payer: Medicare PPO | Admitting: Nurse Practitioner

## 2023-07-07 VITALS — BP 136/71 | HR 72 | Temp 98.1°F | Resp 20 | Ht 64.0 in | Wt 203.0 lb

## 2023-07-07 DIAGNOSIS — I10 Essential (primary) hypertension: Secondary | ICD-10-CM | POA: Diagnosis not present

## 2023-07-07 DIAGNOSIS — Z6834 Body mass index (BMI) 34.0-34.9, adult: Secondary | ICD-10-CM

## 2023-07-07 DIAGNOSIS — F411 Generalized anxiety disorder: Secondary | ICD-10-CM | POA: Diagnosis not present

## 2023-07-07 DIAGNOSIS — E039 Hypothyroidism, unspecified: Secondary | ICD-10-CM | POA: Diagnosis not present

## 2023-07-07 DIAGNOSIS — K219 Gastro-esophageal reflux disease without esophagitis: Secondary | ICD-10-CM | POA: Diagnosis not present

## 2023-07-07 DIAGNOSIS — D696 Thrombocytopenia, unspecified: Secondary | ICD-10-CM | POA: Diagnosis not present

## 2023-07-07 DIAGNOSIS — Z23 Encounter for immunization: Secondary | ICD-10-CM

## 2023-07-07 DIAGNOSIS — R739 Hyperglycemia, unspecified: Secondary | ICD-10-CM | POA: Diagnosis not present

## 2023-07-07 LAB — BAYER DCA HB A1C WAIVED: HB A1C (BAYER DCA - WAIVED): 5 % (ref 4.8–5.6)

## 2023-07-07 MED ORDER — LEVOTHYROXINE SODIUM 88 MCG PO TABS
88.0000 ug | ORAL_TABLET | Freq: Every day | ORAL | 1 refills | Status: DC
Start: 2023-07-07 — End: 2024-01-06

## 2023-07-07 MED ORDER — LISINOPRIL 10 MG PO TABS
10.0000 mg | ORAL_TABLET | Freq: Every day | ORAL | 1 refills | Status: DC
Start: 2023-07-07 — End: 2024-01-06

## 2023-07-07 MED ORDER — LORAZEPAM 0.5 MG PO TABS: 0.5000 mg | ORAL_TABLET | Freq: Two times a day (BID) | ORAL | 5 refills | Status: DC | PRN

## 2023-07-07 MED ORDER — OMEPRAZOLE 20 MG PO CPDR
20.0000 mg | DELAYED_RELEASE_CAPSULE | Freq: Every day | ORAL | 1 refills | Status: DC
Start: 2023-07-07 — End: 2023-12-10

## 2023-07-07 NOTE — Progress Notes (Signed)
Subjective:    Patient ID: Kelly Lewis, female    DOB: Apr 27, 1951, 72 y.o.   MRN: [id number]   Chief Complaint: medical management of chronic issues     HPI:  Kelly Lewis is a 72 y.o. who identifies as a female who was assigned female at birth.   Social history: Lives with: husband Work history: retired   Water engineer in today for follow up of the following chronic medical issues:  1. Primary hypertension No c/o chest pain, sob or headache. Does not check blood pressure at home. BP Readings from Last 3 Encounters:  04/09/23 (!) 160/72  12/31/22 137/76  06/28/22 130/67     2. Gastroesophageal reflux disease without esophagitis Is on omeprazole and is doing well.  3. Acquired hypothyroidism No issues that she is aware of. Lab Results  Component Value Date   TSH 1.570 12/31/2022     4. Thrombocytopenia (HCC) She has seen specialist and they cannot figure our why her platelets are low. They think it may be coming form her liver. Possible NASH. Lab Results  Component Value Date   PLT 80 (L) 04/02/2023     5. GAD (generalized anxiety disorder) Is on ativan and is doing well.    07/07/2023   10:51 AM 12/31/2022   10:59 AM 06/28/2022   11:29 AM 04/25/2022    8:57 AM  GAD 7 : Generalized Anxiety Score  Nervous, Anxious, on Edge 0 0 1 0  Control/stop worrying 0 0 0 0  Worry too much - different things 0 0 0 0  Trouble relaxing 0 0 0 0  Restless 0 0 0 0  Easily annoyed or irritable 0 0 0 0  Afraid - awful might happen 0 0 0 0  Total GAD 7 Score 0 0 1 0  Anxiety Difficulty Not difficult at all Not difficult at all Not difficult at all Not difficult at all      6. BMI 34.0-34.9,adult Weight is up 4 lbs Wt Readings from Last 3 Encounters:  07/07/23 203 lb (92.1 kg)  04/09/23 199 lb 8.3 oz (90.5 kg)  12/31/22 204 lb (92.5 kg)   BMI Readings from Last 3 Encounters:  07/07/23 34.84 kg/m  04/09/23 34.25 kg/m  12/31/22 35.02 kg/m      New  complaints: None today  Allergies  Allergen Reactions   Amoxicillin-Pot Clavulanate Other (See Comments)    Urinary retention   Cephalexin Nausea And Vomiting    Internal head pressure and nausea   Doxycycline Nausea And Vomiting    Gi and internal head pressure   Nitrofurantoin Nausea And Vomiting    Gi upset and internal head pressure   Ciprofloxacin Hcl Nausea Only and Other (See Comments)    Abdominal pain, anxiety, facial rash   Naprosyn [Naproxen]     gerd   Other Other (See Comments)    Family History of Reaction   Shingrix [Zoster Vac Recomb Adjuvanted]     Family History of Reaction   Sulfonamide Derivatives     REACTION: skin turns red   Xylocaine [Lidocaine Hcl]     Real red, hot and flushed all over. Pt reports the redness was from being nervous and wasn't actually a reaction.    Outpatient Encounter Medications as of 07/07/2023  Medication Sig   Cholecalciferol (VITAMIN D3) 2000 UNITS TABS Take 2,000 Units by mouth daily.    Cranberry 300 MG tablet Take 300 mg by mouth daily. Utiva supplement , cranberry extract 36  PAC   Fe Bisgly-Vit C-Vit B12-FA (GENTLE IRON PO) Take by mouth every other day.   gabapentin (NEURONTIN) 100 MG capsule Take 1 capsule (100 mg total) by mouth 3 (three) times daily.   ibuprofen (ADVIL,MOTRIN) 200 MG tablet Take 200 mg by mouth every 6 (six) hours as needed for mild pain.   levothyroxine (SYNTHROID) 88 MCG tablet TAKE 1 TABLET BY MOUTH EVERY DAY   lisinopril (ZESTRIL) 10 MG tablet Take 1 tablet (10 mg total) by mouth daily.   LORazepam (ATIVAN) 0.5 MG tablet Take 1 tablet (0.5 mg total) by mouth 2 (two) times daily as needed for anxiety.   omeprazole (PRILOSEC) 20 MG capsule Take 1 capsule by mouth daily.   ondansetron (ZOFRAN) 4 MG tablet Take 1 tablet (4 mg total) by mouth 2 (two) times daily as needed for nausea or vomiting.   Probiotic Product (ALIGN) 4 MG CAPS Take by mouth.   vitamin B-12 (CYANOCOBALAMIN) 1000 MCG tablet Take 1,000  mcg by mouth daily.   vitamin C (ASCORBIC ACID) 500 MG tablet Take 500 mg by mouth daily.   Facility-Administered Encounter Medications as of 07/07/2023  Medication   0.9 %  sodium chloride infusion    Past Surgical History:  Procedure Laterality Date   bladder tacked  1984   COLONOSCOPY  2011   1- TA   UPPER GASTROINTESTINAL ENDOSCOPY  2012   at [place], normal    Family History  Problem Relation Age of Onset   Colon polyps Mother    Diabetes Mother    Pneumonia Father    Cancer Father        skin   Colon polyps Sister    Cancer Sister        skin   Colon cancer Neg Hx    Esophageal cancer Neg Hx    Rectal cancer Neg Hx    Stomach cancer Neg Hx       Controlled substance contract: n/a     Review of Systems  Constitutional:  Negative for diaphoresis.  Eyes:  Negative for pain.  Respiratory:  Negative for shortness of breath.   Cardiovascular:  Negative for chest pain, palpitations and leg swelling.  Gastrointestinal:  Negative for abdominal pain.  Endocrine: Negative for polydipsia.  Skin:  Negative for rash.  Neurological:  Negative for dizziness, weakness and headaches.  Hematological:  Does not bruise/bleed easily.  All other systems reviewed and are negative.      Objective:   Physical Exam Vitals and nursing note reviewed.  Constitutional:      General: She is not in acute distress.    Appearance: Normal appearance. She is well-developed.  HENT:     Head: Normocephalic.     Right Ear: Tympanic membrane normal.     Left Ear: Tympanic membrane normal.     Nose: Nose normal.     Mouth/Throat:     Mouth: Mucous membranes are moist.  Eyes:     Pupils: Pupils are equal, round, and reactive to light.  Neck:     Vascular: No carotid bruit or JVD.  Cardiovascular:     Rate and Rhythm: Normal rate and regular rhythm.     Heart sounds: Normal heart sounds.  Pulmonary:     Effort: Pulmonary effort is normal. No respiratory distress.     Breath sounds:  Normal breath sounds. No wheezing or rales.  Chest:     Chest wall: No tenderness.  Abdominal:     General: Bowel sounds  are normal. There is no distension or abdominal bruit.     Palpations: Abdomen is soft. There is no hepatomegaly, splenomegaly, mass or pulsatile mass.     Tenderness: There is no abdominal tenderness.  Musculoskeletal:        General: Normal range of motion.     Cervical back: Normal range of motion and neck supple.  Lymphadenopathy:     Cervical: No cervical adenopathy.  Skin:    General: Skin is warm and dry.  Neurological:     Mental Status: She is alert and oriented to person, place, and time.     Deep Tendon Reflexes: Reflexes are normal and symmetric.  Psychiatric:        Behavior: Behavior normal.        Thought Content: Thought content normal.        Judgment: Judgment normal.    BP 136/71   Pulse 72   Temp 98.1 F (36.7 C) (Temporal)   Resp 20   Ht 5\' 4"  (1.626 m)   Wt 203 lb (92.1 kg)   SpO2 97%   BMI 34.84 kg/m         Assessment & Plan:   LUCYANN ROMANO comes in today with chief complaint of Medical Management of Chronic Issues   Diagnosis and orders addressed:  1. Primary hypertension Low sodium diet - CBC with Differential/Platelet - CMP14+EGFR - Lipid panel - lisinopril (ZESTRIL) 10 MG tablet; Take 1 tablet (10 mg total) by mouth daily.  Dispense: 90 tablet; Refill: 1  2. Gastroesophageal reflux disease without esophagitis Avoid spicy foods Do not eat 2 hours prior to bedtime - omeprazole (PRILOSEC) 20 MG capsule; Take 1 capsule (20 mg total) by mouth daily.  Dispense: 90 capsule; Refill: 1  3. Acquired hypothyroidism Labs pending - Thyroid Panel With TSH - levothyroxine (SYNTHROID) 88 MCG tablet; Take 1 tablet (88 mcg total) by mouth daily.  Dispense: 90 tablet; Refill: 1  4. Thrombocytopenia (HCC) Lab spending  5. GAD (generalized anxiety disorder) Stress management - LORazepam (ATIVAN) 0.5 MG tablet; Take 1 tablet  (0.5 mg total) by mouth 2 (two) times daily as needed for anxiety.  Dispense: 60 tablet; Refill: 5  6. BMI 34.0-34.9,adult Discussed diet and exercise for person with BMI >25 Will recheck weight in 3-6 months   7. Elevated blood sugar Labs pending - Bayer DCA Hb A1c Waived   Labs pending Health Maintenance reviewed Diet and exercise encouraged  Follow up plan: 6 months   Mary-Margaret Daphine Deutscher, FNP

## 2023-07-07 NOTE — Patient Instructions (Signed)

## 2023-07-08 LAB — THYROID PANEL WITH TSH
Free Thyroxine Index: 2.7 (ref 1.2–4.9)
T3 Uptake Ratio: 23 % — ABNORMAL LOW (ref 24–39)
T4, Total: 11.9 ug/dL (ref 4.5–12.0)
TSH: 2.2 u[IU]/mL (ref 0.450–4.500)

## 2023-07-08 LAB — CBC WITH DIFFERENTIAL/PLATELET
Basophils Absolute: 0 10*3/uL (ref 0.0–0.2)
Basos: 1 %
EOS (ABSOLUTE): 0 10*3/uL (ref 0.0–0.4)
Eos: 1 %
Hematocrit: 41.3 % (ref 34.0–46.6)
Hemoglobin: 13.6 g/dL (ref 11.1–15.9)
Immature Grans (Abs): 0 10*3/uL (ref 0.0–0.1)
Immature Granulocytes: 1 %
Lymphocytes Absolute: 1 10*3/uL (ref 0.7–3.1)
Lymphs: 25 %
MCH: 30.2 pg (ref 26.6–33.0)
MCHC: 32.9 g/dL (ref 31.5–35.7)
MCV: 92 fL (ref 79–97)
Monocytes Absolute: 0.2 10*3/uL (ref 0.1–0.9)
Monocytes: 5 %
Neutrophils Absolute: 2.7 10*3/uL (ref 1.4–7.0)
Neutrophils: 67 %
Platelets: 82 10*3/uL — CL (ref 150–450)
RBC: 4.5 x10E6/uL (ref 3.77–5.28)
RDW: 12.5 % (ref 11.7–15.4)
WBC: 3.9 10*3/uL (ref 3.4–10.8)

## 2023-07-08 LAB — CMP14+EGFR
ALT: 14 [IU]/L (ref 0–32)
AST: 25 [IU]/L (ref 0–40)
Albumin: 4.2 g/dL (ref 3.8–4.8)
Alkaline Phosphatase: 193 [IU]/L — ABNORMAL HIGH (ref 44–121)
BUN/Creatinine Ratio: 13 (ref 12–28)
BUN: 9 mg/dL (ref 8–27)
Bilirubin Total: 0.8 mg/dL (ref 0.0–1.2)
CO2: 19 mmol/L — ABNORMAL LOW (ref 20–29)
Calcium: 9.8 mg/dL (ref 8.7–10.3)
Chloride: 100 mmol/L (ref 96–106)
Creatinine, Ser: 0.7 mg/dL (ref 0.57–1.00)
Globulin, Total: 2.9 g/dL (ref 1.5–4.5)
Glucose: 94 mg/dL (ref 70–99)
Potassium: 4.5 mmol/L (ref 3.5–5.2)
Sodium: 134 mmol/L (ref 134–144)
Total Protein: 7.1 g/dL (ref 6.0–8.5)
eGFR: 92 mL/min/{1.73_m2} (ref >59)

## 2023-07-08 LAB — LIPID PANEL
Chol/HDL Ratio: 3.3 ratio (ref 0.0–4.4)
Cholesterol, Total: 204 mg/dL — ABNORMAL HIGH (ref 100–199)
HDL: 61 mg/dL (ref >39)
LDL Chol Calc (NIH): 127 mg/dL — ABNORMAL HIGH (ref 0–99)
Triglycerides: 91 mg/dL (ref 0–149)
VLDL Cholesterol Cal: 16 mg/dL (ref 5–40)

## 2023-10-07 ENCOUNTER — Inpatient Hospital Stay: Payer: Medicare PPO | Attending: Hematology

## 2023-10-07 DIAGNOSIS — K7581 Nonalcoholic steatohepatitis (NASH): Secondary | ICD-10-CM | POA: Insufficient documentation

## 2023-10-07 DIAGNOSIS — R161 Splenomegaly, not elsewhere classified: Secondary | ICD-10-CM | POA: Diagnosis not present

## 2023-10-07 DIAGNOSIS — D696 Thrombocytopenia, unspecified: Secondary | ICD-10-CM | POA: Diagnosis not present

## 2023-10-07 DIAGNOSIS — K219 Gastro-esophageal reflux disease without esophagitis: Secondary | ICD-10-CM | POA: Insufficient documentation

## 2023-10-07 DIAGNOSIS — I10 Essential (primary) hypertension: Secondary | ICD-10-CM | POA: Diagnosis not present

## 2023-10-07 DIAGNOSIS — K7460 Unspecified cirrhosis of liver: Secondary | ICD-10-CM | POA: Insufficient documentation

## 2023-10-07 DIAGNOSIS — D509 Iron deficiency anemia, unspecified: Secondary | ICD-10-CM | POA: Insufficient documentation

## 2023-10-07 DIAGNOSIS — E039 Hypothyroidism, unspecified: Secondary | ICD-10-CM | POA: Insufficient documentation

## 2023-10-07 DIAGNOSIS — E611 Iron deficiency: Secondary | ICD-10-CM

## 2023-10-07 LAB — CBC WITH DIFFERENTIAL/PLATELET
Abs Immature Granulocytes: 0.01 10*3/uL (ref 0.00–0.07)
Basophils Absolute: 0 10*3/uL (ref 0.0–0.1)
Basophils Relative: 1 %
Eosinophils Absolute: 0 10*3/uL (ref 0.0–0.5)
Eosinophils Relative: 1 %
HCT: 40 % (ref 36.0–46.0)
Hemoglobin: 13 g/dL (ref 12.0–15.0)
Immature Granulocytes: 0 %
Lymphocytes Relative: 28 %
Lymphs Abs: 1 10*3/uL (ref 0.7–4.0)
MCH: 30 pg (ref 26.0–34.0)
MCHC: 32.5 g/dL (ref 30.0–36.0)
MCV: 92.2 fL (ref 80.0–100.0)
Monocytes Absolute: 0.3 10*3/uL (ref 0.1–1.0)
Monocytes Relative: 8 %
Neutro Abs: 2.3 10*3/uL (ref 1.7–7.7)
Neutrophils Relative %: 62 %
Platelets: 79 10*3/uL — ABNORMAL LOW (ref 150–400)
RBC: 4.34 MIL/uL (ref 3.87–5.11)
RDW: 13.2 % (ref 11.5–15.5)
Smear Review: DECREASED
WBC: 3.7 10*3/uL — ABNORMAL LOW (ref 4.0–10.5)
nRBC: 0 % (ref 0.0–0.2)

## 2023-10-07 LAB — COMPREHENSIVE METABOLIC PANEL
ALT: 15 U/L (ref 0–44)
AST: 27 U/L (ref 15–41)
Albumin: 3.9 g/dL (ref 3.5–5.0)
Alkaline Phosphatase: 142 U/L — ABNORMAL HIGH (ref 38–126)
Anion gap: 8 (ref 5–15)
BUN: 13 mg/dL (ref 8–23)
CO2: 25 mmol/L (ref 22–32)
Calcium: 9.5 mg/dL (ref 8.9–10.3)
Chloride: 100 mmol/L (ref 98–111)
Creatinine, Ser: 0.75 mg/dL (ref 0.44–1.00)
GFR, Estimated: >60 mL/min (ref >60)
Glucose, Bld: 101 mg/dL — ABNORMAL HIGH (ref 70–99)
Potassium: 4.2 mmol/L (ref 3.5–5.1)
Sodium: 133 mmol/L — ABNORMAL LOW (ref 135–145)
Total Bilirubin: 0.8 mg/dL (ref 0.0–1.2)
Total Protein: 7.1 g/dL (ref 6.5–8.1)

## 2023-10-07 LAB — IRON AND TIBC
Iron: 62 ug/dL (ref 28–170)
Saturation Ratios: 16 % (ref 10.4–31.8)
TIBC: 395 ug/dL (ref 250–450)
UIBC: 333 ug/dL

## 2023-10-07 LAB — FERRITIN: Ferritin: 40 ng/mL (ref 11–307)

## 2023-10-13 ENCOUNTER — Telehealth: Payer: Self-pay

## 2023-10-13 NOTE — Telephone Encounter (Signed)
 4/9 at 850 am appt made with Dr Yvone Herd.  The pt advised and will discuss repeat US  and labs at that time.

## 2023-10-13 NOTE — Progress Notes (Signed)
VIRTUAL VISIT via TELEPHONE NOTE Kelly Lewis   I connected with Kelly Lewis  on 10/14/23 at 8:20 AM by telephone and verified that I am speaking with the correct person using two identifiers.  Location: Patient: Home Provider: [facility]   I discussed the limitations, risks, security and privacy concerns of performing an evaluation and management service by telephone and the availability of in person appointments. I also discussed with the patient that there may be a patient responsible charge related to this service. The patient expressed understanding and agreed to proceed.  REASON FOR VISIT:  Follow-up for thrombocytopenia (liver cirrhosis) and iron deficiency anemia   PRIOR THERAPY: None   CURRENT THERAPY: Observation  INTERVAL HISTORY:  Kelly Lewis is contacted today for follow-up of thrombocytopenia and iron deficiency anemia.  She was last seen by Rojelio Brenner PA-C on 04/09/2023.   At today's visit, she reports feeling fairly well, although she and her husband are currently awaiting news from diagnostic bronchoscopy that he underwent earlier this week.  Regarding her own health, she has not had any recent hospitalizations, surgeries, or changes in baseline health status.   She reports occasional scant rectal bleeding from hemorrhoid, but denies any gross hematochezia, melena, epistaxis, or hematuria.  She has occasional mild gum bleeding.  She denies any easy bruising or petechial rash.  She has not had any B symptoms such as fever, chills, night sweats, or unintentional weight loss.  She has been trying to eat iron rich foods, since she was unable to tolerate oral iron supplement. She denies any pica, severe headaches, or restless legs.  Overall, she reports that her energy is good, but that she has some intermittent fatigue related to back pain and right leg pain that limits her activities.  Her weight has been stable since her last visit.   She has 60% energy and 100% appetite.  ASSESSMENT & PLAN:  1.  Thrombocytopenia & leukopenia - Review of past labs shows normal CBC in 2012, with onset of thrombocytopenia in 2018, which has been progressive since that time.   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  Negative H. pylori, hepatitis B, hepatitis C, HIV.  Nutritional panel unremarkable with normal B12, folate, methylmalonic acid, copper.  ANA and rheumatoid factor negative.  LDH normal. - No bleeding, bruising, petechial rash     - No unexplained fever, chills, night sweats, weight loss.  No new lumps or bumps.       - No lymphadenopathy or hepatosplenomegaly palpated on exam at last office visit  - Most recent CBC (10/07/2023): Platelets 79, WBC 3.7 with normal differential.  Blood counts at baseline. - Differential diagnosis favors thrombocytopenia and leukopenia secondary to cirrhosis and splenic sequestration.  If worsening unexplained abnormalities in CBC, would consider bone marrow biopsy for further work-up. - PLAN: Platelets and WBC are at baseline.  No indication for treatment at this time, but would consider further treatment if platelets were persistently less than 30.   - Repeat CBC and RTC in 6 months.   2.  Iron deficiency without anemia - Iron  panel (03/21/2022): Ferritin 22, iron saturation 32% with normal Hgb 12.6/MCV 92.1 - No signs or symptoms of GI bleeding apart from occasional hemorrhoidal bleeding - EGD (11/15/2021): Portal hypertensive gastropathy - Colonoscopy  (11/15/2021): Polyps x 3, mild diverticulosis, external hemorrhoids - Unable to tolerate ferrous sulfate or ferrous gluconate due to constipation and abdominal cramping - She is also taking B12 1000 mcg daily to help her energy, although her B12/MMA levels have been WNL (last checked July 2024) - She takes omeprazole daily.  She has never had IV iron. - Most recent labs (2//25): Hgb 13.0, ferritin 40, iron saturation 16%. - PLAN: We will hold off on IV iron, since she is asymptomatic and without anemia, per patient preference - Repeat CBC and iron panel in 6 months.     3.   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  Radiology recommendations for follow-up renal sonogram in 6 months to assess stability. - Renal ultrasound (02/11/2022): Stable 8 mm hyperechoic focus in the midpole cortex of right kidney which may represent angiomyolipoma.  Follow-up ultrasound in 12 months recommended to ensure stability. - Abdominal US today (04/09/23): Right kidney shows no evidence of mass or hydronephrosis.  She does have 6 mm stone identified.   4.  Other history - PMH: Osteoarthritis, hypothyroidism, hypertension, anxiety, GERD.  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 Russella Dar of [facility]. - SOCIAL: Retired Runner, broadcasting/film/video.  Lives with her husband.  No tobacco, alcohol, drugs. - FAMILY: Father with NASH.  Father and sister with melanoma.   PLAN SUMMARY:  >> Labs in 6 months = CBC/D, ferritin, iron/TIBC >> OFFICE visit in 6 months (1 week after labs)      REVIEW OF SYSTEMS:   Review of Systems  Constitutional:  Positive for malaise/fatigue. Negative for chills, diaphoresis, fever and weight loss.  Respiratory:  Positive for cough. Negative for shortness of breath.    Cardiovascular:  Negative for chest pain and palpitations.  Gastrointestinal:  Positive for constipation and diarrhea. Negative for abdominal pain, blood in stool, melena, nausea and vomiting.  Musculoskeletal:  Positive for back pain and joint pain.  Neurological:  Negative for dizziness and headaches.     PHYSICAL EXAM: (per limitations of virtual telephone visit)  The patient is alert and oriented x 3, exhibiting adequate mentation, good mood, and ability to speak in full sentences and execute sound judgement.  WRAP UP:   I discussed the assessment and treatment plan with the patient. The patient was provided an opportunity to ask questions and all were answered. The patient agreed with the plan and demonstrated an understanding of the instructions.   The patient was advised to call back or seek an in-person evaluation if the symptoms worsen or if the condition fails to improve as anticipated.  I provided 23 minutes of non-face-to-face time during this encounter, with >10 minutes of medical discussion.  Carnella Guadalajara, PA-C 10/14/2023 8:49 AM

## 2023-10-13 NOTE — Telephone Encounter (Signed)
-----   Message from Nurse Miyanna Wiersma P sent at 04/10/2023  1:03 PM EDT ----- Repeat RUQ US  in 6 months

## 2023-10-14 ENCOUNTER — Inpatient Hospital Stay (HOSPITAL_BASED_OUTPATIENT_CLINIC_OR_DEPARTMENT_OTHER): Payer: Medicare PPO | Admitting: Physician Assistant

## 2023-10-14 DIAGNOSIS — E611 Iron deficiency: Secondary | ICD-10-CM

## 2023-10-14 DIAGNOSIS — D509 Iron deficiency anemia, unspecified: Secondary | ICD-10-CM

## 2023-10-14 DIAGNOSIS — D696 Thrombocytopenia, unspecified: Secondary | ICD-10-CM | POA: Diagnosis not present

## 2023-10-17 DIAGNOSIS — Z1231 Encounter for screening mammogram for malignant neoplasm of breast: Secondary | ICD-10-CM | POA: Diagnosis not present

## 2023-10-24 ENCOUNTER — Encounter: Payer: Self-pay | Admitting: Nurse Practitioner

## 2023-12-08 NOTE — Progress Notes (Unsigned)
 [facility] Return Visit   Referring Provider Bennie Pierini, FNP [address] [place],  [place] [address]  Primary Care Provider Bennie Pierini, FNP  Patient Profile: Kelly Lewis is a 73 y.o. female who returns to the [facility] for follow-up of the problem(s) noted below.  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 Otherwise negative.  General Review of Systems  Review of systems is significant for the pertinent positives and negatives as listed per the HPI.  Full ROS is otherwise negative.  Past Medical History   Past Medical History:  Diagnosis Date   Arthritis    Chronic anxiety    GERD (gastroesophageal reflux disease)    H/O degenerative disc disease    Heart murmur    Hyperlipidemia    Hypertension    Non-alcoholic fatty liver disease    Osteopenia 2021   Plantar fasciitis    Temporary low platelet count (HCC) 2022   94K then up to 104 in May of 2022   Thyroid disease      Past Surgical History   Past Surgical History:  Procedure Laterality Date   bladder tacked  1984   COLONOSCOPY  2011   1- TA   UPPER GASTROINTESTINAL ENDOSCOPY  2012   at [place], normal     Allergies and Medications   Allergies  Allergen Reactions   Amoxicillin-Pot Clavulanate Other (See Comments)    Urinary retention   Cephalexin Nausea And Vomiting    Internal head pressure and nausea   Doxycycline Nausea And Vomiting    Gi and internal head pressure   Nitrofurantoin Nausea And Vomiting    Gi upset and internal head pressure   Ciprofloxacin Hcl  Nausea Only and Other (See Comments)    Abdominal pain, anxiety, facial rash   Naprosyn [Naproxen]     gerd   Other Other (See Comments)    Family History of Reaction   Shingrix [Zoster Vac Recomb Adjuvanted]     Family History of Reaction   Sulfonamide Derivatives     REACTION: skin turns red   Xylocaine [Lidocaine Hcl]     Real red, hot and flushed all over. Pt reports the redness was from being nervous and wasn't actually a reaction.     @MEDSTODAY @  Family History   Family History  Problem Relation Age of Onset   Colon polyps Mother    Diabetes Mother    Pneumonia Father    Cancer Father        skin   Colon polyps Sister    Cancer Sister        skin   Colon cancer Neg Hx    Esophageal cancer Neg Hx    Rectal cancer Neg Hx    Stomach cancer Neg Hx    GI Specific Family History: {gifamhx:50061}   Social History   Social History   Tobacco Use   Smoking status: Never    Passive exposure: Past (husband usually smokes outside or in vehicle)   Smokeless tobacco: Never  Vaping Use   Vaping status: Never Used  Substance Use Topics  Alcohol use: No   Drug use: No   Kelly Lewis reports that she has never smoked. She has been exposed to tobacco smoke. She has never used smokeless tobacco. She reports that she does not drink alcohol and does not use drugs.  Vital Signs and Physical Examination  There were no vitals filed for this visit. There is no height or weight on file to calculate BMI.    General: Well developed, well nourished, no acute distress Head: Normocephalic and atraumatic Eyes: Sclerae anicteric, EOMI Ears: Normal auditory acuity Mouth: No deformities or lesions noted Lungs: Clear throughout to auscultation Heart: Regular rate and rhythm; No murmurs, rubs or bruits Abdomen: Soft, non tender and non distended. No masses, hepatosplenomegaly or hernias noted. Normal Bowel sounds Rectal: Musculoskeletal: Symmetrical with no gross deformities  Pulses:   Normal pulses noted Extremities: No edema or deformities noted Neurological: Alert oriented x 4, grossly nonfocal Psychological:  Alert and cooperative. Normal mood and affect   Review of Data  The following data was reviewed at the time of this encounter:  Laboratory Studies      Latest Ref Rng & Units 10/07/2023    7:39 AM 07/07/2023   11:43 AM 04/02/2023   12:40 PM  CBC  WBC 4.0 - 10.5 K/uL 3.7  3.9  4.9   Hemoglobin 12.0 - 15.0 g/dL 69.6  29.5  28.4   Hematocrit 36.0 - 46.0 % 40.0  41.3  41.1   Platelets 150 - 400 K/uL 79  82  80     No results found for: "LIPASE"    Latest Ref Rng & Units 10/07/2023    7:39 AM 07/07/2023   11:43 AM 04/02/2023   12:40 PM  CMP  Glucose 70 - 99 mg/dL 132  94  440   BUN 8 - 23 mg/dL 13  9  10    Creatinine 0.44 - 1.00 mg/dL 1.02  7.25  3.66   Sodium 135 - 145 mmol/L 133  134  133   Potassium 3.5 - 5.1 mmol/L 4.2  4.5  4.0   Chloride 98 - 111 mmol/L 100  100  101   CO2 22 - 32 mmol/L 25  19  24    Calcium 8.9 - 10.3 mg/dL 9.5  9.8  9.4   Total Protein 6.5 - 8.1 g/dL 7.1  7.1  7.5   Total Bilirubin 0.0 - 1.2 mg/dL 0.8  0.8  0.9   Alkaline Phos 38 - 126 U/L 142  193  147   AST 15 - 41 U/L 27  25  27    ALT 0 - 44 U/L 15  14  19     AFP 03/2023    3.2 09/2022    3.9 03/2022    3.4  Imaging Studies  AUS 03/2023 1. Increased hepatic echotexture suggestive of hepatic steatosis. 2. Splenomegaly. 3. 6 mm right renal stone.  AUS 09/2022 1. Coarsened echogenicity and nodular contour of the liver compatible with history of cirrhosis. No focal lesion identified. 2. No cholelithiasis or sonographic evidence for acute cholecystitis.  AUS 03/2022 No cholelithiasis or sonographic evidence for acute cholecystitis.  Morphologic changes compatible with cirrhosis.  No focal lesion.  CTAP 09/2021 1. Cirrhotic changes involving the liver with portal venous hypertension, portal venous collaterals and splenomegaly. No ascites. 2. No hepatic lesions to  suggest hepatoma. 3. No acute abdominal/pelvic findings, mass lesions or lymphadenopathy. 4. 3.3 cm simple appearing left ovarian cyst. Recommend follow-up pelvic ultrasound examination in 6-12 months. 5. Moderate  cystocele. 6. Aortic atherosclerosis.  AUS 07/2021 Fatty liver. Nodularity in the liver surface suggests possible cirrhosis. Enlarged spleen.   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 Follow-up renal sonogram in 6 months may be considered to assess stability.  GI Procedures and Studies  EGD/colonoscopy 10/2021 EGD -mild PHG, otherwise normal Colonoscopy 10/2021 -8 mm polyp in AC at piecemeal polypectomy site, two 7 mm polyps in TC and AC, diverticulosis left colon, EH Path: TA and SSA  Colonoscopy 04/10/2021 - 25 mm polyp in [facility] status post piecemeal removal - Five 5 to 7 mm polyps in the EDC, TC, AC and cecum - Mild diverticulosis left colon - External hemorrhoids Path: Tubular adenomas and hyperplastic polyps  Colonoscopy 05/2010 - 4 mm sessile polyp in [facility] Path: TA  Clinical Impression  It is my clinical impression that Ms. Slatten is a 73 y.o. female with;  ***  Plan  *** *** *** *** ***   Planned Follow Up No follow-ups on file.  The patient or caregiver verbalized understanding of the material covered, with no barriers to understanding. All questions were answered. Patient or caregiver is agreeable with the plan outlined above.    It was a pleasure to see Mamta.  If you have any questions or concerns regarding this evaluation, do not hesitate to contact me.  Maren Beach, MD [facility]

## 2023-12-10 ENCOUNTER — Encounter: Payer: Self-pay | Admitting: Pediatrics

## 2023-12-10 ENCOUNTER — Ambulatory Visit: Payer: Medicare PPO | Admitting: Pediatrics

## 2023-12-10 ENCOUNTER — Other Ambulatory Visit (INDEPENDENT_AMBULATORY_CARE_PROVIDER_SITE_OTHER)

## 2023-12-10 VITALS — BP 136/70 | HR 73 | Ht 64.0 in | Wt 208.0 lb

## 2023-12-10 DIAGNOSIS — K7460 Unspecified cirrhosis of liver: Secondary | ICD-10-CM

## 2023-12-10 DIAGNOSIS — R1013 Epigastric pain: Secondary | ICD-10-CM | POA: Diagnosis not present

## 2023-12-10 DIAGNOSIS — K219 Gastro-esophageal reflux disease without esophagitis: Secondary | ICD-10-CM

## 2023-12-10 DIAGNOSIS — Z860100 Personal history of colon polyps, unspecified: Secondary | ICD-10-CM

## 2023-12-10 DIAGNOSIS — Z860101 Personal history of adenomatous and serrated colon polyps: Secondary | ICD-10-CM | POA: Diagnosis not present

## 2023-12-10 LAB — PROTIME-INR
INR: 1.1 ratio — ABNORMAL HIGH (ref 0.8–1.0)
Prothrombin Time: 11.8 s (ref 9.6–13.1)

## 2023-12-10 MED ORDER — ONDANSETRON HCL 4 MG PO TABS: 4.0000 mg | ORAL_TABLET | Freq: Two times a day (BID) | ORAL | 2 refills | Status: DC | PRN

## 2023-12-10 MED ORDER — OMEPRAZOLE 20 MG PO CPDR
20.0000 mg | DELAYED_RELEASE_CAPSULE | Freq: Every day | ORAL | 3 refills | Status: DC
Start: 2023-12-10 — End: 2024-07-08

## 2023-12-10 MED ORDER — OMEPRAZOLE 20 MG PO CPDR: 20.0000 mg | DELAYED_RELEASE_CAPSULE | Freq: Every day | ORAL | 3 refills | Status: DC

## 2023-12-10 MED ORDER — OMEPRAZOLE 20 MG PO CPDR
20.0000 mg | DELAYED_RELEASE_CAPSULE | Freq: Every day | ORAL | 3 refills | Status: DC
Start: 2023-12-10 — End: 2023-12-10

## 2023-12-10 NOTE — Patient Instructions (Signed)
 Your provider has requested that you go to the basement level for lab work before leaving today. Press "B" on the elevator. The lab is located at the first door on the left as you exit the elevator.  Due to recent changes in healthcare laws, you may see the results of your imaging and laboratory studies on MyChart before your provider has had a chance to review them.  We understand that in some cases there may be results that are confusing or concerning to you. Not all laboratory results come back in the same time frame and the provider may be waiting for multiple results in order to interpret others.  Please give [place] 48 hours in order for your provider to thoroughly review all the results before contacting the office for clarification of your results.    You have been scheduled for an abdominal ultrasound at [facility] on Monday, December 22, 2023 at 10:30am. Please arrive 15 minutes prior to your appointment for registration. Make certain not to have anything to eat or drink after midnight prior to your appointment. Should you need to reschedule your appointment, please contact radiology at [phone]. This test typically takes about 30 minutes to perform.  We have sent the following medications to your pharmacy for you to pick up at your convenience: Omeprazole 20 mg  Complete next labs in 6 months.  Follow up in 1 year.  Thank you for entrusting me with your care and for choosing [facility], Dr. Maren Beach   _______________________________________________________  If your blood pressure at your visit was 140/90 or greater, please contact your primary care physician to follow up on this.  _______________________________________________________  If you are age 24 or older, your body mass index should be between 23-30. Your Body mass index is 35.7 kg/m. If this is out of the aforementioned range listed, please consider follow up with your Primary Care Provider.  If you  are age 51 or younger, your body mass index should be between 19-25. Your Body mass index is 35.7 kg/m. If this is out of the aformentioned range listed, please consider follow up with your Primary Care Provider.   ________________________________________________________  The [facility] providers would like to encourage you to use [facility] to communicate with providers for non-urgent requests or questions.  Due to long hold times on the telephone, sending your provider a message by [facility] may be a faster and more efficient way to get a response.  Please allow 48 business hours for a response.  Please remember that this is for non-urgent requests.  _______________________________________________________

## 2023-12-11 ENCOUNTER — Encounter: Payer: Self-pay | Admitting: Pediatrics

## 2023-12-11 LAB — AFP TUMOR MARKER: AFP-Tumor Marker: 3.8 ng/mL

## 2023-12-22 ENCOUNTER — Ambulatory Visit (HOSPITAL_COMMUNITY)
Admission: RE | Admit: 2023-12-22 | Discharge: 2023-12-22 | Disposition: A | Discharge status: Home / Self Care | Source: Ambulatory Visit | Attending: Pediatrics | Admitting: Pediatrics

## 2023-12-22 ENCOUNTER — Encounter: Payer: Self-pay | Admitting: Pediatrics

## 2023-12-22 DIAGNOSIS — K7460 Unspecified cirrhosis of liver: Secondary | ICD-10-CM | POA: Diagnosis not present

## 2023-12-22 DIAGNOSIS — R161 Splenomegaly, not elsewhere classified: Secondary | ICD-10-CM | POA: Diagnosis not present

## 2023-12-22 DIAGNOSIS — K7689 Other specified diseases of liver: Secondary | ICD-10-CM | POA: Diagnosis not present

## 2023-12-22 DIAGNOSIS — K219 Gastro-esophageal reflux disease without esophagitis: Secondary | ICD-10-CM | POA: Insufficient documentation

## 2024-01-06 ENCOUNTER — Ambulatory Visit (INDEPENDENT_AMBULATORY_CARE_PROVIDER_SITE_OTHER)

## 2024-01-06 ENCOUNTER — Encounter: Payer: Self-pay | Admitting: Nurse Practitioner

## 2024-01-06 ENCOUNTER — Ambulatory Visit (INDEPENDENT_AMBULATORY_CARE_PROVIDER_SITE_OTHER): Payer: Medicare PPO | Admitting: Nurse Practitioner

## 2024-01-06 VITALS — BP 133/65 | HR 81 | Temp 97.6°F | Ht 64.0 in | Wt 205.0 lb

## 2024-01-06 DIAGNOSIS — E039 Hypothyroidism, unspecified: Secondary | ICD-10-CM

## 2024-01-06 DIAGNOSIS — F411 Generalized anxiety disorder: Secondary | ICD-10-CM

## 2024-01-06 DIAGNOSIS — I10 Essential (primary) hypertension: Secondary | ICD-10-CM

## 2024-01-06 DIAGNOSIS — Z0001 Encounter for general adult medical examination with abnormal findings: Secondary | ICD-10-CM

## 2024-01-06 DIAGNOSIS — Z0000 Encounter for general adult medical examination without abnormal findings: Secondary | ICD-10-CM

## 2024-01-06 DIAGNOSIS — D696 Thrombocytopenia, unspecified: Secondary | ICD-10-CM | POA: Diagnosis not present

## 2024-01-06 DIAGNOSIS — K219 Gastro-esophageal reflux disease without esophagitis: Secondary | ICD-10-CM

## 2024-01-06 DIAGNOSIS — Z6834 Body mass index (BMI) 34.0-34.9, adult: Secondary | ICD-10-CM

## 2024-01-06 DIAGNOSIS — Z139 Encounter for screening, unspecified: Secondary | ICD-10-CM | POA: Diagnosis not present

## 2024-01-06 LAB — CBC WITH DIFFERENTIAL/PLATELET

## 2024-01-06 MED ORDER — LEVOTHYROXINE SODIUM 88 MCG PO TABS: 88.0000 ug | ORAL_TABLET | Freq: Every day | ORAL | 1 refills | Status: DC

## 2024-01-06 MED ORDER — LORAZEPAM 0.5 MG PO TABS: 0.5000 mg | ORAL_TABLET | Freq: Two times a day (BID) | ORAL | 5 refills | Status: DC | PRN

## 2024-01-06 MED ORDER — LISINOPRIL 10 MG PO TABS: 10.0000 mg | ORAL_TABLET | Freq: Every day | ORAL | 1 refills | Status: DC

## 2024-01-06 NOTE — Progress Notes (Signed)
 Subjective:    Patient ID: Kelly Lewis, female    DOB: Sep 18, 1950, 73 y.o.   MRN: [id number]   Chief Complaint: annual physical    HPI:  Kelly Lewis is a 73 y.o. who identifies as a female who was assigned female at birth.   Social history: Lives with: husband Work history: retired   Water engineer in today for follow up of the following chronic medical issues:  1. Primary hypertension No c/o chest pain, sob or headache. Does not check blood pressure at home. BP Readings from Last 3 Encounters:  12/10/23 136/70  07/07/23 136/71  04/09/23 (!) 160/72     2. Gastroesophageal reflux disease without esophagitis Is on omeprazole  and is doing well.  3. Acquired hypothyroidism No issues that she is aware of. Lab Results  Component Value Date   TSH 2.200 07/07/2023     4. Thrombocytopenia (HCC) She has seen specialist and they cannot figure our why her platelets are low. They think it may be coming form her liver. Possible NASH. Lab Results  Component Value Date   PLT 79 (L) 10/07/2023     5. GAD (generalized anxiety disorder) Is on ativan  and is doing well.    07/07/2023   10:51 AM 12/31/2022   10:59 AM 06/28/2022   11:29 AM 04/25/2022    8:57 AM  GAD 7 : Generalized Anxiety Score  Nervous, Anxious, on Edge 0 0 1 0  Control/stop worrying 0 0 0 0  Worry too much - different things 0 0 0 0  Trouble relaxing 0 0 0 0  Restless 0 0 0 0  Easily annoyed or irritable 0 0 0 0  Afraid - awful might happen 0 0 0 0  Total GAD 7 Score 0 0 1 0  Anxiety Difficulty Not difficult at all Not difficult at all Not difficult at all Not difficult at all        6. BMI 34.0-34.9,adult Weight is down 3lbs  Wt Readings from Last 3 Encounters:  01/06/24 205 lb (93 kg)  12/10/23 208 lb (94.3 kg)  07/07/23 203 lb (92.1 kg)   BMI Readings from Last 3 Encounters:  01/06/24 35.19 kg/m  12/10/23 35.70 kg/m  07/07/23 34.84 kg/m       New complaints: None today  Allergies   Allergen Reactions   Amoxicillin -Pot Clavulanate Other (See Comments)    Urinary retention   Cephalexin  Nausea And Vomiting    Internal head pressure and nausea   Doxycycline Nausea And Vomiting    Gi and internal head pressure   Nitrofurantoin Nausea And Vomiting    Gi upset and internal head pressure   Ciprofloxacin  Hcl Nausea Only and Other (See Comments)    Abdominal pain, anxiety, facial rash   Naprosyn [Naproxen]     gerd   Other Other (See Comments)    Family History of Reaction   Shingrix [Zoster Vac Recomb Adjuvanted]     Family History of Reaction   Sulfonamide Derivatives     REACTION: skin turns red   Xylocaine [Lidocaine Hcl]     Real red, hot and flushed all over. Pt reports the redness was from being nervous and wasn't actually a reaction.    Outpatient Encounter Medications as of 01/06/2024  Medication Sig   Cholecalciferol  (VITAMIN D3) 2000 UNITS TABS Take 2,000 Units by mouth daily.    Cranberry 300 MG tablet Take 300 mg by mouth daily. Utiva supplement , cranberry extract 36 PAC  ibuprofen (ADVIL,MOTRIN) 200 MG tablet Take 200 mg by mouth every 6 (six) hours as needed for mild pain.   levothyroxine  (SYNTHROID ) 88 MCG tablet Take 1 tablet (88 mcg total) by mouth daily.   lisinopril  (ZESTRIL ) 10 MG tablet Take 1 tablet (10 mg total) by mouth daily.   LORazepam  (ATIVAN ) 0.5 MG tablet Take 1 tablet (0.5 mg total) by mouth 2 (two) times daily as needed for anxiety.   omeprazole  (PRILOSEC ) 20 MG capsule Take 1 capsule (20 mg total) by mouth daily.   ondansetron  (ZOFRAN ) 4 MG tablet Take 1 tablet (4 mg total) by mouth 2 (two) times daily as needed for nausea or vomiting.   Probiotic Product (ALIGN) 4 MG CAPS Take by mouth.   vitamin B-12 (CYANOCOBALAMIN ) 1000 MCG tablet Take 1,000 mcg by mouth daily.   vitamin C (ASCORBIC ACID) 500 MG tablet Take 500 mg by mouth daily.   Facility-Administered Encounter Medications as of 01/06/2024  Medication   0.9 %  sodium chloride   infusion    Past Surgical History:  Procedure Laterality Date   bladder tacked  1984   COLONOSCOPY  2011   1- TA   UPPER GASTROINTESTINAL ENDOSCOPY  2012   at [place], normal    Family History  Problem Relation Age of Onset   Colon polyps Mother    Diabetes Mother    Pneumonia Father    Cancer Father        skin   Colon polyps Sister    Cancer Sister        skin   Colon cancer Neg Hx    Esophageal cancer Neg Hx    Rectal cancer Neg Hx    Stomach cancer Neg Hx       Controlled substance contract: n/a     Review of Systems  Constitutional:  Negative for diaphoresis.  Eyes:  Negative for pain.  Respiratory:  Negative for shortness of breath.   Cardiovascular:  Negative for chest pain, palpitations and leg swelling.  Gastrointestinal:  Negative for abdominal pain.  Endocrine: Negative for polydipsia.  Skin:  Negative for rash.  Neurological:  Negative for dizziness, weakness and headaches.  Hematological:  Does not bruise/bleed easily.  All other systems reviewed and are negative.      Objective:   Physical Exam Vitals and nursing note reviewed.  Constitutional:      General: She is not in acute distress.    Appearance: Normal appearance. She is well-developed.  HENT:     Head: Normocephalic.     Right Ear: Tympanic membrane normal.     Left Ear: Tympanic membrane normal.     Nose: Nose normal.     Mouth/Throat:     Mouth: Mucous membranes are moist.  Eyes:     Pupils: Pupils are equal, round, and reactive to light.  Neck:     Vascular: No carotid bruit or JVD.  Cardiovascular:     Rate and Rhythm: Normal rate and regular rhythm.     Heart sounds: Normal heart sounds.  Pulmonary:     Effort: Pulmonary effort is normal. No respiratory distress.     Breath sounds: Normal breath sounds. No wheezing or rales.  Chest:     Chest wall: No tenderness.  Abdominal:     General: Bowel sounds are normal. There is no distension or abdominal bruit.      Palpations: Abdomen is soft. There is no hepatomegaly, splenomegaly, mass or pulsatile mass.     Tenderness: There  is no abdominal tenderness.  Musculoskeletal:        General: Normal range of motion.     Cervical back: Normal range of motion and neck supple.  Lymphadenopathy:     Cervical: No cervical adenopathy.  Skin:    General: Skin is warm and dry.  Neurological:     Mental Status: She is alert and oriented to person, place, and time.     Deep Tendon Reflexes: Reflexes are normal and symmetric.  Psychiatric:        Behavior: Behavior normal.        Thought Content: Thought content normal.        Judgment: Judgment normal.    BP 133/65   Pulse 81   Temp 97.6 F (36.4 C) (Temporal)   Ht 5\' 4"  (1.626 m)   Wt 205 lb (93 kg)   SpO2 99%   BMI 35.19 kg/m   Bambi Lever, FNP  Chest xray normal-Preliminary reading by Irvine Mantis, FNP  [facility]        Assessment & Plan:   Kelly Lewis comes in today with chief complaint of annual physical  Diagnosis and orders addressed:  1. Primary hypertension Low sodium diet - CBC with Differential/Platelet - CMP14+EGFR - Lipid panel - lisinopril  (ZESTRIL ) 10 MG tablet; Take 1 tablet (10 mg total) by mouth daily.  Dispense: 90 tablet; Refill: 1  2. Gastroesophageal reflux disease without esophagitis Avoid spicy foods Do not eat 2 hours prior to bedtime - omeprazole  (PRILOSEC ) 20 MG capsule; Take 1 capsule (20 mg total) by mouth daily.  Dispense: 90 capsule; Refill: 1  3. Acquired hypothyroidism Labs pending - Thyroid  Panel With TSH - levothyroxine  (SYNTHROID ) 88 MCG tablet; Take 1 tablet (88 mcg total) by mouth daily.  Dispense: 90 tablet; Refill: 1  4. Thrombocytopenia (HCC) Lab spending  5. GAD (generalized anxiety disorder) Stress management - LORazepam  (ATIVAN ) 0.5 MG tablet; Take 1 tablet (0.5 mg total) by mouth 2 (two) times daily as needed for anxiety.  Dispense: 60 tablet; Refill: 5  6. BMI  34.0-34.9,adult Discussed diet and exercise for person with BMI >25 Will recheck weight in 3-6 months    Labs pending Health Maintenance reviewed Diet and exercise encouraged  Follow up plan: 6 months   Mary-Margaret Gaylyn Keas, FNP

## 2024-01-06 NOTE — Patient Instructions (Signed)
Exercise Information for Aging Adults Staying physically active is important as you age. Physical activity and exercise can help in maintaining quality of life, health, physical function, and reducing falls. The four types of exercises that are best for older adults are endurance, strength, balance, and flexibility. Contact your health care provider before you start any exercise routine. Ask your health care provider what activities are safe for you. What are the risks? Risks associated with exercising include: Overdoing it. This may lead to sore muscles or fatigue. Falls. Injuries. Dehydration. How to do these exercises Endurance exercises Endurance (aerobic) exercises raise your breathing rate and heart rate. Increasing your endurance helps you do everyday tasks and stay healthy. By improving the health of your body system that includes your heart, lungs, and blood vessels (circulatory system), you may also delay or prevent diseases such as heart disease, diabetes, and weak bones (osteoporosis). Types of endurance exercises include: Sports. Indoor activities, such as using gym equipment, doing water aerobics, or dancing. Outdoor activities, such as biking or jogging. Tasks around the house, such as gardening, yard work, and heavy household chores like cleaning. Walking, such as hiking or walking around your neighborhood. When doing endurance exercises, make sure you: Are aware of your surroundings. Use safety equipment as directed. Dress in layers when exercising outdoors. Drink plenty of water to stay well hydrated. Build up endurance slowly. Start with 10 minutes at a time, and gradually build up to doing 30 minutes at a time. Unless your health care provider gave you different instructions, aim to exercise for a total of 150 minutes a week. Spread out that time so you are working on endurance 3 or more days a week. Strength exercises Lifting, pulling, or pushing weights helps to  strengthen muscles. Having stronger muscles makes it easier to do everyday activities, such as getting up from a chair, climbing stairs, carrying groceries, and playing with grandchildren. Strength exercises include arm and leg exercises that may be done: With weights. Without weights (using your own body weight). With a resistance band. When doing strength exercises: Move smoothly and steadily. Do not suddenly thrust or jerk the weights, the resistance band, or your body. Start with no weights or with light weights, and gradually add more weight over time. Eventually, aim to use weights that are hard or very hard for you to lift. This means that you are able to do 8 repetitions with the weight, and the last few repetitions are very challenging. Lift or push weights into position for 3 seconds, hold the position for 1 second, and then take 3 seconds to return to your starting position. Breathe out (exhale) during difficult movements, like lifting or pushing weights. Breathe in (inhale) to relax your muscles before the next repetition. Consider alternating arms or legs, especially when you first start strength exercises. Expect some slight muscle soreness after each session. Do strength exercises on 2 or more days a week, for 30 minutes at a time. Avoid exercising the same muscle groups two days in a row. For example, if you work on your leg muscles one day, work on your arm muscles the next day. When you can do two sets of 10-15 repetitions with a certain weight, increase the amount of weight. Balance exercises Balance exercises can help to prevent falls. Balance exercises include: Standing on one foot. Heel-to-toe walk. Balance walk. Tai chi. Make sure you have something sturdy to hold onto while doing balance exercises, such as a sturdy chair. As your balance   improves, challenge yourself by holding on to the chair with one hand instead of two, and then with no hands. Trying exercises with your  eyes closed also challenges your balance, but be sure to have a sturdy surface (like a countertop) close by in case you need it. Do balance exercises as often as you want, or as often as directed by your health care provider. Flexibility exercises  Flexibility exercises improve how far you can bend, straighten, move, or rotate parts of your body (range of motion). These exercises also help you do everyday activities such as getting dressed or reaching for objects. Flexibility exercises include stretching different parts of the body, and they may be done in a standing or seated position or on the floor. When stretching, make sure you: Keep a slight bend in your arms and legs. Avoid completely straightening ("locking") your joints. Do not stretch so far that you feel pain. You should feel a mild stretching feeling. You may try stretching farther as you become more flexible over time. Relax and breathe between stretches. Hold on to something sturdy for balance as needed. Hold each stretch for 10-30 seconds. Repeat each stretch 3-5 times. General safety tips Exercise in well-lit areas. Do not hold your breath during exercises or stretches. Warm up before exercising, and cool down after exercising. This can help prevent injury. Drink plenty of water during exercise or any activity that makes you sweat. If you are not sure if an exercise is safe for you, or you are not sure how to do an exercise, talk with your health care provider. This is especially important if you have had surgery on muscles, bones, or joints (orthopedic surgery). Where to find more information You can find more information about exercise for older adults from: Your local health department, fitness center, or community center. These facilities may have programs for aging adults. National Institute on Aging: www.nia.nih.gov National Council on Aging: www.ncoa.org Summary Staying physically active is important as you age. Doing  endurance, strength, balance, and flexibility exercises can help in maintaining quality of life, health, physical function, and reducing falls. Make sure to contact your health care provider before you start any exercise routine. Ask your health care provider what activities are safe for you. This information is not intended to replace advice given to you by your health care provider. Make sure you discuss any questions you have with your health care provider. Document Revised: 01/01/2021 Document Reviewed: 01/01/2021 Elsevier Patient Education  2024 Elsevier Inc.  

## 2024-01-07 LAB — CMP14+EGFR
ALT: 15 IU/L (ref 0–32)
AST: 29 IU/L (ref 0–40)
Albumin: 4.4 g/dL (ref 3.8–4.8)
Alkaline Phosphatase: 186 IU/L — ABNORMAL HIGH (ref 44–121)
BUN/Creatinine Ratio: 14 (ref 12–28)
BUN: 12 mg/dL (ref 8–27)
Bilirubin Total: 0.9 mg/dL (ref 0.0–1.2)
CO2: 21 mmol/L (ref 20–29)
Calcium: 9.8 mg/dL (ref 8.7–10.3)
Chloride: 96 mmol/L (ref 96–106)
Creatinine, Ser: 0.86 mg/dL (ref 0.57–1.00)
Globulin, Total: 2.8 g/dL (ref 1.5–4.5)
Glucose: 99 mg/dL (ref 70–99)
Potassium: 4.7 mmol/L (ref 3.5–5.2)
Sodium: 133 mmol/L — ABNORMAL LOW (ref 134–144)
Total Protein: 7.2 g/dL (ref 6.0–8.5)
eGFR: 71 mL/min/{1.73_m2} (ref >59)

## 2024-01-07 LAB — LIPID PANEL
Chol/HDL Ratio: 3.5 ratio (ref 0.0–4.4)
Cholesterol, Total: 201 mg/dL — ABNORMAL HIGH (ref 100–199)
HDL: 58 mg/dL (ref >39)
LDL Chol Calc (NIH): 128 mg/dL — ABNORMAL HIGH (ref 0–99)
Triglycerides: 82 mg/dL (ref 0–149)
VLDL Cholesterol Cal: 15 mg/dL (ref 5–40)

## 2024-01-07 LAB — CBC WITH DIFFERENTIAL/PLATELET
Basos: 1 %
EOS (ABSOLUTE): 0 10*3/uL (ref 0.0–0.2)
Eos: 1 %
Hematocrit: 40.9 % (ref 34.0–46.6)
Hemoglobin: 13.3 g/dL (ref 11.1–15.9)
Immature Granulocytes: 0 %
Immature Granulocytes: 0 10*3/uL (ref 0.0–0.1)
Lymphs: 21 %
MCH: 30.1 pg (ref 26.6–33.0)
MCHC: 32.5 g/dL (ref 31.5–35.7)
MCV: 93 fL (ref 79–97)
Monocytes Absolute: 0 10*3/uL (ref 0.0–0.4)
Monocytes Absolute: 0.2 10*3/uL (ref 0.1–0.9)
Monocytes: 6 %
Neutrophils Absolute: 0.8 10*3/uL (ref 0.7–3.1)
Neutrophils Absolute: 2.7 10*3/uL (ref 1.4–7.0)
Neutrophils: 71 %
Platelets: 75 10*3/uL — CL (ref 150–450)
RBC: 4.42 x10E6/uL (ref 3.77–5.28)
RDW: 12.6 % (ref 11.7–15.4)
WBC: 3.8 10*3/uL (ref 3.4–10.8)

## 2024-01-07 LAB — THYROID PANEL WITH TSH
Free Thyroxine Index: 2.4 (ref 1.2–4.9)
T3 Uptake Ratio: 23 % — ABNORMAL LOW (ref 24–39)
T4, Total: 10.6 ug/dL (ref 4.5–12.0)
TSH: 3.48 u[IU]/mL (ref 0.450–4.500)

## 2024-01-09 LAB — TOXASSURE SELECT 13 (MW), URINE

## 2024-02-24 ENCOUNTER — Telehealth: Payer: Self-pay

## 2024-02-24 NOTE — Telephone Encounter (Signed)
 Copied from CRM [id number]. Topic: General - Call Back - No Documentation >> Feb 24, 2024  2:48 PM Wess RAMAN wrote: Reason for CRM: Patient would like to speak with Gladis Mustard, FNP or her nurse about a Gabapentin  being filled.  Callback #: [phone]

## 2024-02-24 NOTE — Telephone Encounter (Signed)
Ntbs.   

## 2024-02-24 NOTE — Telephone Encounter (Signed)
 Mychart message was sent to [facility]. Refer to message in chart

## 2024-02-26 ENCOUNTER — Encounter: Payer: Self-pay | Admitting: Nurse Practitioner

## 2024-02-26 ENCOUNTER — Ambulatory Visit (INDEPENDENT_AMBULATORY_CARE_PROVIDER_SITE_OTHER): Admitting: Nurse Practitioner

## 2024-02-26 VITALS — BP 132/63 | HR 83 | Temp 98.3°F | Ht 64.0 in | Wt 206.0 lb

## 2024-02-26 DIAGNOSIS — K130 Diseases of lips: Secondary | ICD-10-CM | POA: Diagnosis not present

## 2024-02-26 DIAGNOSIS — M79605 Pain in left leg: Secondary | ICD-10-CM

## 2024-02-26 DIAGNOSIS — M79604 Pain in right leg: Secondary | ICD-10-CM

## 2024-02-26 MED ORDER — GABAPENTIN 300 MG PO CAPS: 300.0000 mg | ORAL_CAPSULE | Freq: Three times a day (TID) | ORAL | 5 refills | Status: DC

## 2024-02-26 MED ORDER — NYSTATIN 100000 UNIT/GM EX CREA: 1.0000 | TOPICAL_CREAM | Freq: Two times a day (BID) | CUTANEOUS | 0 refills | Status: AC

## 2024-02-26 NOTE — Progress Notes (Signed)
 Subjective:    Patient ID: Kelly Lewis, female    DOB: Mar 06, 1951, 73 y.o.   MRN: [id number]   Chief Complaint: Discuss gabapentin    HPI  Patient was prescribed gabapentin  in 2024 for pain in both legs. She picked up prescription and read side effects. It scared her so she never took. She was seen on may 6th and was doing well. Several days later her legs started hurting again. She decidde to try gabapentin . She has worked up to taking it 3x a day and her legs feel 100% better. Needs refill.  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  Eyes:  Negative for pain.  Respiratory:  Negative for shortness of breath.   Cardiovascular:  Negative for chest pain, palpitations and leg swelling.  Gastrointestinal:  Negative for abdominal pain.  Endocrine: Negative for polydipsia.  Skin:  Negative for rash.  Neurological:  Negative for dizziness, weakness and headaches.  Hematological:  Does not bruise/bleed easily.  All other systems reviewed and are negative.      Objective:   Physical Exam Constitutional:      Appearance: Normal appearance. She is obese.  HENT:     Mouth/Throat:     Comments: Bil corners of maouth are erythematous  appearling  Cardiovascular:     Rate and Rhythm: Normal rate and regular rhythm.     Heart sounds: Normal heart sounds.  Pulmonary:     Effort: Pulmonary effort is normal.     Breath sounds: Normal breath sounds.   Skin:     General: Skin is warm.   Neurological:     General: No focal deficit present.     Mental Status: She is alert and oriented to person, place, and time.   Psychiatric:        Mood and Affect: Mood normal.        Behavior: Behavior normal.     BP 132/63   Pulse 83   Temp 98.3 F (36.8 C) (Temporal)   Ht 5' 4 (1.626 m)   Wt 206 lb (93.4 kg)   SpO2 97%   BMI 35.36 kg/m        Assessment & Plan:   Kelly Lewis in today with chief complaint of Discuss gabapentin    1. Bilateral leg pain (Primary) Gabapentin  TID Increased gaba[entin to 300mg  form 100mg  TID - gabapentin  (NEURONTIN ) 300 MG capsule; Take 1 capsule (300 mg total) by mouth 3 (three) times daily.  Dispense: 90 capsule; Refill: 5  2. Angular cheilitis Keep area clean and dry - nystatin cream (MYCOSTATIN); Apply 1 Application topically 2 (two) times daily.  Dispense: 60 g; Refill: 0    The above assessment and management plan was discussed with the patient. The patient verbalized understanding of and has agreed to the management plan. Patient is aware to call the clinic if symptoms persist or worsen. Patient is aware when to return to the clinic for a follow-up visit. Patient educated on when it is appropriate to go to the  emergency department.   Mary-Margaret Gladis, FNP

## 2024-03-06 ENCOUNTER — Other Ambulatory Visit: Payer: Self-pay | Admitting: Pediatrics

## 2024-03-27 ENCOUNTER — Other Ambulatory Visit: Payer: Self-pay

## 2024-03-27 ENCOUNTER — Encounter (HOSPITAL_COMMUNITY): Payer: Self-pay

## 2024-03-27 ENCOUNTER — Emergency Department (HOSPITAL_COMMUNITY)

## 2024-03-27 ENCOUNTER — Emergency Department (HOSPITAL_COMMUNITY): Admission: EM | Admit: 2024-03-27 | Discharge: 2024-03-27 | Disposition: A | Discharge status: Home / Self Care

## 2024-03-27 DIAGNOSIS — S93602A Unspecified sprain of left foot, initial encounter: Secondary | ICD-10-CM | POA: Diagnosis not present

## 2024-03-27 DIAGNOSIS — X501XXA Overexertion from prolonged static or awkward postures, initial encounter: Secondary | ICD-10-CM | POA: Insufficient documentation

## 2024-03-27 DIAGNOSIS — S99912A Unspecified injury of left ankle, initial encounter: Secondary | ICD-10-CM | POA: Diagnosis present

## 2024-03-27 DIAGNOSIS — M7732 Calcaneal spur, left foot: Secondary | ICD-10-CM | POA: Diagnosis not present

## 2024-03-27 DIAGNOSIS — Z043 Encounter for examination and observation following other accident: Secondary | ICD-10-CM | POA: Diagnosis not present

## 2024-03-27 NOTE — ED Provider Notes (Signed)
 [place] EMERGENCY DEPARTMENT AT [facility] Provider Note   CSN: [id number] Arrival date & time: 03/27/24  2153     Patient presents with: Ankle Pain   Kelly Lewis is a 73 y.o. female.  {Add pertinent medical, surgical, social history, OB history to HPI:5278} 73 year old female presents for evaluation of left ankle pain.  States she missed the bottom step and tripped and twisted her left ankle.  She denies any fall, did not hit her head and denies any other injury.   Ankle Pain Associated symptoms: no back pain and no fever        Prior to Admission medications   Medication Sig Start Date End Date Taking? Authorizing Provider  Cholecalciferol  (VITAMIN D3) 2000 UNITS TABS Take 2,000 Units by mouth daily.     [provider]  Cranberry 300 MG tablet Take 300 mg by mouth daily. Utiva supplement , cranberry extract 36 PAC    [provider]  gabapentin  (NEURONTIN ) 300 MG capsule Take 1 capsule (300 mg total) by mouth 3 (three) times daily. 02/26/24   Gladis, Mary-Margaret, FNP  ibuprofen (ADVIL,MOTRIN) 200 MG tablet Take 200 mg by mouth every 6 (six) hours as needed for mild pain.    [provider]  levothyroxine  (SYNTHROID ) 88 MCG tablet Take 1 tablet (88 mcg total) by mouth daily. 01/06/24   Gladis Mary-Margaret, FNP  lisinopril  (ZESTRIL ) 10 MG tablet Take 1 tablet (10 mg total) by mouth daily. 01/06/24   Gladis Mary-Margaret, FNP  LORazepam  (ATIVAN ) 0.5 MG tablet Take 1 tablet (0.5 mg total) by mouth 2 (two) times daily as needed for anxiety. 01/06/24   Gladis Mary-Margaret, FNP  nystatin  cream (MYCOSTATIN ) Apply 1 Application topically 2 (two) times daily. 02/26/24   Gladis Mary-Margaret, FNP  omeprazole  (PRILOSEC ) 20 MG capsule Take 1 capsule (20 mg total) by mouth daily. 12/10/23   Suzann Inocente HERO, MD  ondansetron  (ZOFRAN ) 4 MG tablet TAKE 1 TABLET (4 MG TOTAL) BY MOUTH 2 (TWO) TIMES DAILY AS NEEDED FOR NAUSEA OR VOMITING. 03/08/24   McGreal,  Inocente HERO, MD  Probiotic Product (ALIGN) 4 MG CAPS Take by mouth.    [provider]  vitamin B-12 (CYANOCOBALAMIN ) 1000 MCG tablet Take 1,000 mcg by mouth daily.    [provider]  vitamin C (ASCORBIC ACID) 500 MG tablet Take 500 mg by mouth daily.    [provider]    Allergies: Amoxicillin -pot clavulanate, Cephalexin , Doxycycline, Nitrofurantoin, Ciprofloxacin  hcl, Naprosyn [naproxen], Other, Shingrix [zoster vac recomb adjuvanted], Sulfonamide derivatives, and Xylocaine [lidocaine hcl]    Review of Systems  Constitutional:  Negative for chills and fever.  HENT:  Negative for ear pain and sore throat.   Eyes:  Negative for pain and visual disturbance.  Respiratory:  Negative for cough and shortness of breath.   Cardiovascular:  Negative for chest pain and palpitations.  Gastrointestinal:  Negative for abdominal pain and vomiting.  Genitourinary:  Negative for dysuria and hematuria.  Musculoskeletal:  Negative for arthralgias and back pain.       Admits left ankle pain  Skin:  Negative for color change and rash.  Neurological:  Negative for seizures and syncope.  All other systems reviewed and are negative.   Updated Vital Signs BP (!) 154/64   Pulse 89   Ht 5' 4 (1.626 m)   Wt 91.2 kg   SpO2 100%   BMI 34.50 kg/m   Physical Exam Vitals and nursing note reviewed.  Constitutional:  General: She is not in acute distress.    Appearance: Normal appearance. She is well-developed.  HENT:     Head: Normocephalic and atraumatic.  Eyes:     Conjunctiva/sclera: Conjunctivae normal.  Cardiovascular:     Rate and Rhythm: Normal rate and regular rhythm.     Heart sounds: No murmur heard. Pulmonary:     Effort: Pulmonary effort is normal. No respiratory distress.     Breath sounds: Normal breath sounds.  Abdominal:     Palpations: Abdomen is soft.     Tenderness: There is no abdominal tenderness.  Musculoskeletal:        General: Swelling  present. Normal range of motion.     Cervical back: Neck supple.     Comments: There is normal range of motion of the ankle and toes, there is swelling and ecchymosis to the dorsal aspect of the left foot with some mild tenderness to palpation  Skin:    General: Skin is warm and dry.     Capillary Refill: Capillary refill takes less than 2 seconds.  Neurological:     Mental Status: She is alert.  Psychiatric:        Mood and Affect: Mood normal.     (all labs ordered are listed, but only abnormal results are displayed) Labs Reviewed - No data to display  EKG: None  Radiology: DG Ankle 2 Views Left Result Date: 03/27/2024 CLINICAL DATA:  Fall EXAM: LEFT ANKLE - 2 VIEW COMPARISON:  None Available. FINDINGS: There is no evidence for linear fracture, dislocation, or joint effusion. Plantar and posterior calcaneal spurs are present present. There is soft tissue swelling surrounding the ankle. There are tiny osseous densities in the soft tissues adjacent to the dorsal aspect of the talonavicular joint. Findings may be related to old injury, but tiny avulsion fractures are not excluded at this level. IMPRESSION: 1. Tiny osseous densities in the soft tissues adjacent to the dorsal aspect of the talonavicular joint. Findings may be related to old injury, but tiny avulsion fractures are not excluded at this level. Correlate for point tenderness. 2. Soft tissue swelling surrounding the ankle. Electronically Signed   By: Greig Pique M.D.   On: 03/27/2024 22:33    {Document cardiac monitor, telemetry assessment procedure when appropriate:32947} Procedures   Medications Ordered in the ED - No data to display    {Click here for ABCD2, HEART and other calculators REFRESH Note before signing:1}                              Medical Decision Making Amount and/or Complexity of Data Reviewed Radiology: ordered.   ***  {Document critical care time when appropriate  Document review of labs and  clinical decision tools ie CHADS2VASC2, etc  Document your independent review of radiology images and any outside records  Document your discussion with family members, caretakers and with consultants  Document social determinants of health affecting pt's care  Document your decision making why or why not admission, treatments were needed:32947:::1}   Final diagnoses:  None    ED Discharge Orders     None

## 2024-03-27 NOTE — ED Triage Notes (Signed)
 Pt stated that she missed the last step at her house and twisted her left ankle. No other complaints

## 2024-03-27 NOTE — Discharge Instructions (Addendum)
 You can take Tylenol  and Motrin as needed for pain.  Call orthopedic surgery Monday morning to make a follow-up appointment.

## 2024-03-28 DIAGNOSIS — S93692A Other sprain of left foot, initial encounter: Secondary | ICD-10-CM | POA: Diagnosis not present

## 2024-04-05 ENCOUNTER — Inpatient Hospital Stay: Payer: Medicare PPO

## 2024-04-05 ENCOUNTER — Emergency Department (HOSPITAL_COMMUNITY)

## 2024-04-05 ENCOUNTER — Other Ambulatory Visit: Payer: Self-pay

## 2024-04-05 ENCOUNTER — Emergency Department (HOSPITAL_COMMUNITY)
Admission: EM | Admit: 2024-04-05 | Discharge: 2024-04-05 | Disposition: A | Discharge status: Home / Self Care | Attending: Emergency Medicine | Admitting: Emergency Medicine

## 2024-04-05 ENCOUNTER — Encounter (HOSPITAL_COMMUNITY): Payer: Self-pay

## 2024-04-05 ENCOUNTER — Telehealth: Payer: Self-pay | Admitting: *Deleted

## 2024-04-05 DIAGNOSIS — R300 Dysuria: Secondary | ICD-10-CM | POA: Diagnosis not present

## 2024-04-05 DIAGNOSIS — R197 Diarrhea, unspecified: Secondary | ICD-10-CM | POA: Diagnosis not present

## 2024-04-05 DIAGNOSIS — A09 Infectious gastroenteritis and colitis, unspecified: Secondary | ICD-10-CM | POA: Diagnosis not present

## 2024-04-05 DIAGNOSIS — K529 Noninfective gastroenteritis and colitis, unspecified: Secondary | ICD-10-CM | POA: Insufficient documentation

## 2024-04-05 DIAGNOSIS — R109 Unspecified abdominal pain: Secondary | ICD-10-CM | POA: Diagnosis not present

## 2024-04-05 LAB — COMPREHENSIVE METABOLIC PANEL WITH GFR
ALT: 19 U/L (ref 0–44)
AST: 38 U/L (ref 15–41)
Albumin: 3.9 g/dL (ref 3.5–5.0)
Alkaline Phosphatase: 161 U/L — ABNORMAL HIGH (ref 38–126)
Anion gap: 11 (ref 5–15)
BUN: 8 mg/dL (ref 8–23)
CO2: 21 mmol/L — ABNORMAL LOW (ref 22–32)
Calcium: 9.2 mg/dL (ref 8.9–10.3)
Chloride: 97 mmol/L — ABNORMAL LOW (ref 98–111)
Creatinine, Ser: 0.71 mg/dL (ref 0.44–1.00)
GFR, Estimated: >60 mL/min (ref >60)
Glucose, Bld: 104 mg/dL — ABNORMAL HIGH (ref 70–99)
Potassium: 3.6 mmol/L (ref 3.5–5.1)
Sodium: 129 mmol/L — ABNORMAL LOW (ref 135–145)
Total Bilirubin: 1.4 mg/dL — ABNORMAL HIGH (ref 0.0–1.2)
Total Protein: 7.4 g/dL (ref 6.5–8.1)

## 2024-04-05 LAB — CBC WITH DIFFERENTIAL/PLATELET
Abs Immature Granulocytes: 0.01 K/uL (ref 0.00–0.07)
Basophils Absolute: 0 K/uL (ref 0.0–0.1)
Basophils Relative: 0 %
Eosinophils Absolute: 0 K/uL (ref 0.0–0.5)
Eosinophils Relative: 1 %
HCT: 38.2 % (ref 36.0–46.0)
Hemoglobin: 13.2 g/dL (ref 12.0–15.0)
Immature Granulocytes: 0 %
Lymphocytes Relative: 16 %
Lymphs Abs: 0.8 K/uL (ref 0.7–4.0)
MCH: 31.7 pg (ref 26.0–34.0)
MCHC: 34.6 g/dL (ref 30.0–36.0)
MCV: 91.6 fL (ref 80.0–100.0)
Monocytes Absolute: 0.3 K/uL (ref 0.1–1.0)
Monocytes Relative: 7 %
Neutro Abs: 3.6 K/uL (ref 1.7–7.7)
Neutrophils Relative %: 76 %
Platelets: 84 K/uL — ABNORMAL LOW (ref 150–400)
RBC: 4.17 MIL/uL (ref 3.87–5.11)
RDW: 13 % (ref 11.5–15.5)
WBC: 4.7 K/uL (ref 4.0–10.5)
nRBC: 0 % (ref 0.0–0.2)

## 2024-04-05 LAB — URINALYSIS, ROUTINE W REFLEX MICROSCOPIC
Bilirubin Urine: NEGATIVE
Glucose, UA: NEGATIVE mg/dL
Ketones, ur: NEGATIVE mg/dL
Nitrite: POSITIVE — AB
Protein, ur: NEGATIVE mg/dL
Specific Gravity, Urine: 1.005 (ref 1.005–1.030)
pH: 6 (ref 5.0–8.0)

## 2024-04-05 LAB — LIPASE, BLOOD: Lipase: 38 U/L (ref 11–51)

## 2024-04-05 MED ORDER — SODIUM CHLORIDE 0.9 % IV BOLUS
1000.0000 mL | Freq: Once | INTRAVENOUS | Status: AC
  Administered 2024-04-05: 1000 mL via INTRAVENOUS

## 2024-04-05 NOTE — Telephone Encounter (Signed)
 Patient called stating that she has had explosive diarrhea since Friday.  Says that she is concerned about her platelets, considering her liver disease and stool was black this morning.  Advised to go to the ER now.  Verbalized understanding.

## 2024-04-05 NOTE — ED Triage Notes (Signed)
 Last Friday, pt started having explosive diarrhea. Diarrhea ceased over night. Saturday morning, diarrhea came back just as bas as before with continued stools. Sunday diarrhea pt stated was, black.

## 2024-04-05 NOTE — ED Provider Notes (Signed)
 [place] EMERGENCY DEPARTMENT AT [facility] Provider Note   CSN: [id number] Arrival date & time: 04/05/24  1142     Patient presents with: Diarrhea   Kelly Lewis is a 73 y.o. female.  {Add pertinent medical, surgical, social history, OB history to YEP:67052} Patient complains of diarrhea on Friday Saturday and Sunday..  She had 1 episode of diarrhea today.  No fevers no chills  The history is provided by the patient and medical records. No language interpreter was used.  Diarrhea Quality:  Mucous Severity:  Mild Onset quality:  Sudden Timing:  Intermittent Progression:  Unchanged Relieved by:  Nothing Worsened by:  Nothing Ineffective treatments:  None tried Associated symptoms: no abdominal pain and no headaches   Risk factors: no recent antibiotic use        Prior to Admission medications   Medication Sig Start Date End Date Taking? Authorizing Provider  Cholecalciferol  (VITAMIN D3) 2000 UNITS TABS Take 2,000 Units by mouth daily.     [provider]  Cranberry 300 MG tablet Take 300 mg by mouth daily. Utiva supplement , cranberry extract 36 PAC    [provider]  gabapentin  (NEURONTIN ) 300 MG capsule Take 1 capsule (300 mg total) by mouth 3 (three) times daily. 02/26/24   Gladis, Mary-Margaret, FNP  ibuprofen (ADVIL,MOTRIN) 200 MG tablet Take 200 mg by mouth every 6 (six) hours as needed for mild pain.    [provider]  levothyroxine  (SYNTHROID ) 88 MCG tablet Take 1 tablet (88 mcg total) by mouth daily. 01/06/24   Gladis, Mary-Margaret, FNP  lisinopril  (ZESTRIL ) 10 MG tablet Take 1 tablet (10 mg total) by mouth daily. 01/06/24   Gladis, Mary-Margaret, FNP  LORazepam  (ATIVAN ) 0.5 MG tablet Take 1 tablet (0.5 mg total) by mouth 2 (two) times daily as needed for anxiety. 01/06/24   Gladis Mary-Margaret, FNP  nystatin  cream (MYCOSTATIN ) Apply 1 Application topically 2 (two) times daily. 02/26/24   Gladis Mary-Margaret, FNP  omeprazole   (PRILOSEC ) 20 MG capsule Take 1 capsule (20 mg total) by mouth daily. 12/10/23   Suzann Inocente HERO, MD  ondansetron  (ZOFRAN ) 4 MG tablet TAKE 1 TABLET (4 MG TOTAL) BY MOUTH 2 (TWO) TIMES DAILY AS NEEDED FOR NAUSEA OR VOMITING. 03/08/24   McGreal, Inocente HERO, MD  Probiotic Product (ALIGN) 4 MG CAPS Take by mouth.    [provider]  vitamin B-12 (CYANOCOBALAMIN ) 1000 MCG tablet Take 1,000 mcg by mouth daily.    [provider]  vitamin C (ASCORBIC ACID) 500 MG tablet Take 500 mg by mouth daily.    [provider]    Allergies: Amoxicillin -pot clavulanate, Cephalexin , Doxycycline, Nitrofurantoin, Ciprofloxacin  hcl, Naprosyn [naproxen], Other, Shingrix [zoster vac recomb adjuvanted], Sulfonamide derivatives, and Xylocaine [lidocaine hcl]    Review of Systems  Constitutional:  Negative for appetite change and fatigue.  HENT:  Negative for congestion, ear discharge and sinus pressure.   Eyes:  Negative for discharge.  Respiratory:  Negative for cough.   Cardiovascular:  Negative for chest pain.  Gastrointestinal:  Positive for diarrhea. Negative for abdominal pain.  Genitourinary:  Negative for frequency and hematuria.  Musculoskeletal:  Negative for back pain.  Skin:  Negative for rash.  Neurological:  Negative for seizures and headaches.  Psychiatric/Behavioral:  Negative for hallucinations.     Updated Vital Signs BP (!) 159/71 (BP Location: Left Arm)   Pulse 92   Temp 98.2 F (36.8 C) (Oral)   Resp 19   Ht 5' 4 (  1.626 m)   Wt 91.2 kg   SpO2 96%   BMI 34.51 kg/m   Physical Exam Vitals and nursing note reviewed.  Constitutional:      Appearance: She is well-developed.  HENT:     Head: Normocephalic.     Nose: Nose normal.  Eyes:     General: No scleral icterus.    Conjunctiva/sclera: Conjunctivae normal.  Neck:     Thyroid : No thyromegaly.  Cardiovascular:     Rate and Rhythm: Normal rate and regular rhythm.     Heart sounds: No murmur heard.    No  friction rub. No gallop.  Pulmonary:     Breath sounds: No stridor. No wheezing or rales.  Chest:     Chest wall: No tenderness.  Abdominal:     General: There is no distension.     Tenderness: There is no abdominal tenderness. There is no rebound.  Genitourinary:    Comments: Heme-negative stool Musculoskeletal:        General: Normal range of motion.     Cervical back: Neck supple.  Lymphadenopathy:     Cervical: No cervical adenopathy.  Skin:    Findings: No erythema or rash.  Neurological:     Mental Status: She is alert and oriented to person, place, and time.     Motor: No abnormal muscle tone.     Coordination: Coordination normal.  Psychiatric:        Behavior: Behavior normal.     (all labs ordered are listed, but only abnormal results are displayed) Labs Reviewed  COMPREHENSIVE METABOLIC PANEL WITH GFR - Abnormal; Notable for the following components:      Result Value   Sodium 129 (*)    Chloride 97 (*)    CO2 21 (*)    Glucose, Bld 104 (*)    Alkaline Phosphatase 161 (*)    Total Bilirubin 1.4 (*)    All other components within normal limits  URINALYSIS, ROUTINE W REFLEX MICROSCOPIC - Abnormal; Notable for the following components:   APPearance CLOUDY (*)    Hgb urine dipstick SMALL (*)    Nitrite POSITIVE (*)    Leukocytes,Ua LARGE (*)    Bacteria, UA MANY (*)    All other components within normal limits  CBC WITH DIFFERENTIAL/PLATELET - Abnormal; Notable for the following components:   Platelets 84 (*)    All other components within normal limits  GASTROINTESTINAL PANEL BY PCR, STOOL (REPLACES STOOL CULTURE)  C DIFFICILE QUICK SCREEN W PCR REFLEX    URINE CULTURE  LIPASE, BLOOD  POC OCCULT BLOOD, ED  POC OCCULT BLOOD, ED    EKG: None  Radiology: DG ABD ACUTE 2+V W 1V CHEST Result Date: 04/05/2024 CLINICAL DATA:  Persistent diarrhea and abdominal pain EXAM: DG ABDOMEN ACUTE WITH 1 VIEW CHEST COMPARISON:  Chest radiograph dated 01/06/2024, CT  abdomen and pelvis dated 09/07/2021 FINDINGS: Lines/tubes: None. Chest: No focal consolidations. No pneumothorax or pleural effusion. Normal heart size. Abdomen: Right most hemiabdomen is not included within the field of view. Nonobstructive bowel gas pattern. No pneumatosis or free air. No abnormal calcification or mass effect. Bones: No acute osseous abnormality. IMPRESSION: 1. Nonobstructive bowel gas pattern. 2. No acute cardiopulmonary disease. Electronically Signed   By: Limin  Xu M.D.   On: 04/05/2024 14:41    {Document cardiac monitor, telemetry assessment procedure when appropriate:32947} Procedures   Medications Ordered in the ED  sodium chloride  0.9 % bolus 1,000 mL (1,000 mLs Intravenous New Bag/Given  04/05/24 1428)      {Click here for ABCD2, HEART and other calculators REFRESH Note before signing:1}                              Medical Decision Making Amount and/or Complexity of Data Reviewed Labs: ordered. Radiology: ordered.   Patient with gastroenteritis.  She will bring back a stool specimen if she has one and follow-up with her PCP in 2 to 3 days.  She will drink plenty of fluids and her doctor can follow-up on her urine culture  {Document critical care time when appropriate  Document review of labs and clinical decision tools ie CHADS2VASC2, etc  Document your independent review of radiology images and any outside records  Document your discussion with family members, caretakers and with consultants  Document social determinants of health affecting pt's care  Document your decision making why or why not admission, treatments were needed:32947:::1}   Final diagnoses:  Diarrhea of infectious origin    ED Discharge Orders     None

## 2024-04-05 NOTE — Discharge Instructions (Addendum)
 Drink plenty of fluids.  If you have another diarrheal stool you can bring the specimen to the lab.  Follow-up with your family doctor in 2 to 3 days for recheck.  Your urine culture should be back in 3 days and your doctor can decide if you want us  to place you on any other antibiotics

## 2024-04-06 ENCOUNTER — Other Ambulatory Visit (HOSPITAL_COMMUNITY)
Admission: RE | Admit: 2024-04-06 | Discharge: 2024-04-06 | Disposition: A | Discharge status: Home / Self Care | Source: Ambulatory Visit | Attending: Emergency Medicine | Admitting: Emergency Medicine

## 2024-04-06 DIAGNOSIS — A09 Infectious gastroenteritis and colitis, unspecified: Secondary | ICD-10-CM | POA: Insufficient documentation

## 2024-04-07 LAB — GASTROINTESTINAL PANEL BY PCR, STOOL (REPLACES STOOL CULTURE)

## 2024-04-07 LAB — POC OCCULT BLOOD, ED: Fecal Occult Bld: NEGATIVE

## 2024-04-07 LAB — URINE CULTURE: Culture: >=100000 — AB

## 2024-04-08 ENCOUNTER — Telehealth: Payer: Self-pay | Admitting: Nurse Practitioner

## 2024-04-08 ENCOUNTER — Encounter: Payer: Self-pay | Admitting: Nurse Practitioner

## 2024-04-08 ENCOUNTER — Telehealth (HOSPITAL_BASED_OUTPATIENT_CLINIC_OR_DEPARTMENT_OTHER): Payer: Self-pay | Admitting: *Deleted

## 2024-04-08 ENCOUNTER — Ambulatory Visit: Admitting: Nurse Practitioner

## 2024-04-08 VITALS — BP 141/75 | HR 86 | Temp 97.3°F | Ht 64.0 in | Wt 202.4 lb

## 2024-04-08 DIAGNOSIS — A09 Infectious gastroenteritis and colitis, unspecified: Secondary | ICD-10-CM | POA: Diagnosis not present

## 2024-04-08 DIAGNOSIS — N3000 Acute cystitis without hematuria: Secondary | ICD-10-CM | POA: Diagnosis not present

## 2024-04-08 DIAGNOSIS — Z09 Encounter for follow-up examination after completed treatment for conditions other than malignant neoplasm: Secondary | ICD-10-CM

## 2024-04-08 MED ORDER — FOSFOMYCIN TROMETHAMINE 3 G PO PACK: 3.0000 g | PACK | Freq: Once | ORAL | Status: DC

## 2024-04-08 MED ORDER — FOSFOMYCIN TROMETHAMINE 3 G PO PACK: 3.0000 g | PACK | Freq: Once | ORAL | 0 refills | Status: AC

## 2024-04-08 NOTE — Telephone Encounter (Signed)
 Post ED Visit - Positive Culture Follow-up  Culture report reviewed by antimicrobial stewardship pharmacist: [facility] [x]  [place], [place].D. []  Venetia Gully, Pharm.D., BCPS AQ-ID []  Garrel Crews, Pharm.D., BCPS []  Almarie Lunger, Pharm.D., BCPS []  [place], [address].D., BCPS, AAHIVP []  Rosaline Bihari, Pharm.D., BCPS, AAHIVP []  Vernell Meier, PharmD, BCPS []  Latanya Hint, PharmD, BCPS []  Donald Medley, PharmD, BCPS []  Rocky Bold, PharmD []  Dorothyann Alert, PharmD, BCPS []  Morene Babe, PharmD  [facility] []  Rosaline Edison, PharmD []  Romona Bliss, PharmD []  Dolphus Roller, PharmD []  Veva Seip, Rph []  Vernell Daunt) Leonce, PharmD []  Eva Allis, PharmD []  Rosaline Millet, PharmD []  Iantha Batch, PharmD []  Arvin Gauss, PharmD []  Wanda Hasting, PharmD []  Ronal Rav, PharmD []  Rocky Slade, PharmD []  Bard Jeans, PharmD   Positive urine culture Instructed to f/u with PCP in 2-3 days with stool specimen and urine cx.  No urinary sx noted.  No abx needed; no further patient follow-up is required at this time.  Kelly Lewis 04/08/2024, 12:12 PM

## 2024-04-08 NOTE — Addendum Note (Signed)
 Addended by: Amiah Frohlich G on: 04/08/2024 12:10 PM   Modules accepted: Orders

## 2024-04-08 NOTE — Telephone Encounter (Signed)
Pt aware rx resent to pharmacy  °

## 2024-04-08 NOTE — Progress Notes (Signed)
 Established Patient Office Visit  Subjective  Patient ID: Kelly Lewis, female    DOB: 04/24/1951  Age: 73 y.o. MRN: [id number]  Chief Complaint  Patient presents with   Hospitalization Follow-up    Santina to hospital Monday for diarrhea, was told she had a uti     HPI Kelly Lewis is a 73 year old female who presents with her daughter for a post-hospital discharge follow-up. She was recently hospitalized due to diarrhea and, during her stay, had a urinalysis and urine culture that was positive for E. coli. She received IV hydration but declined antibiotics at the time due to a history of multiple antibiotic allergies. Today, she is requesting treatment for the UTI. She reports a long history of frequent UTIs and is currently taking over-the-counter cranberry supplements.  Patient Active Problem List   Diagnosis Date Noted   Acute cystitis without hematuria 04/08/2024   Diarrhea of infectious origin 04/08/2024   Hospital discharge follow-up 04/08/2024   Thrombocytopenia (HCC) 06/28/2021   Genitourinary syndrome of menopause 03/07/2020   Incomplete emptying of bladder 03/07/2020   Nocturia 03/07/2020   POP-Q stage 3 cystocele 03/07/2020   Degeneration of lumbar intervertebral disc 06/09/2018   BMI 34.0-34.9,adult 03/28/2015   GAD (generalized anxiety disorder) 01/07/2013   Hypertension 01/07/2013   Acquired hypothyroidism 01/07/2013   GERD (gastroesophageal reflux disease) 01/07/2013   Past Medical History:  Diagnosis Date   Arthritis    Chronic anxiety    GERD (gastroesophageal reflux disease)    H/O degenerative disc disease    Heart murmur    Hyperlipidemia    Hypertension    Non-alcoholic fatty liver disease    Osteopenia 2021   Plantar fasciitis    Temporary low platelet count (HCC) 2022   94K then up to 104 in May of 2022   Thyroid  disease    Past Surgical History:  Procedure Laterality Date   bladder tacked  1984   COLONOSCOPY  2011   1- [facility]   UPPER  GASTROINTESTINAL ENDOSCOPY  2012   at [facility], normal   Social History   Tobacco Use   Smoking status: Never    Passive exposure: Past (husband usually smokes outside or in vehicle)   Smokeless tobacco: Never  Vaping Use   Vaping status: Never Used  Substance Use Topics   Alcohol use: No   Drug use: No   Social History   Socioeconomic History   Marital status: Married    Spouse name: Rutha   Number of children: 2   Years of education: College   Highest education level: Bachelor's degree (e.g., BA, AB, BS)  Occupational History   Occupation: Retired   Tobacco Use   Smoking status: Never    Passive exposure: Past (husband usually smokes outside or in vehicle)   Smokeless tobacco: Never  Vaping Use   Vaping status: Never Used  Substance and Sexual Activity   Alcohol use: No   Drug use: No   Sexual activity: Not Currently  Other Topics Concern   Not on file  Social History Narrative   Not on file   Social Drivers of Health   Financial Resource Strain: Low Risk  (02/25/2024)   Overall Financial Resource Strain (CARDIA)    Difficulty of Paying Living Expenses: Not hard at all  Food Insecurity: No Food Insecurity (02/25/2024)   Hunger Vital Sign    Worried About Running Out of Food in the Last Year: Never true    Ran Out of Food  in the Last Year: Never true  Transportation Needs: No Transportation Needs (02/25/2024)   PRAPARE - Administrator, Civil Service (Medical): No    Lack of Transportation (Non-Medical): No  Physical Activity: Unknown (02/25/2024)   Exercise Vital Sign    Days of Exercise per Week: 3 days    Minutes of Exercise per Session: Not on file  Stress: No Stress Concern Present (02/25/2024)   [employer] of Occupational Health - Occupational Stress Questionnaire    Feeling of Stress: Only a little  Social Connections: Socially Integrated (02/25/2024)   Social Connection and Isolation Panel    Frequency of Communication with Friends and  Family: More than three times a week    Frequency of Social Gatherings with Friends and Family: Three times a week    Attends Religious Services: More than 4 times per year    Active Member of Clubs or Organizations: Yes    Attends Banker Meetings: 1 to 4 times per year    Marital Status: Married  Catering manager Violence: Not At Risk (01/11/2019)   Humiliation, Afraid, Rape, and Kick questionnaire    Fear of Current or Ex-Partner: No    Emotionally Abused: No    Physically Abused: No    Sexually Abused: No   Family Status  Relation Name Status   Mother  Deceased   Father  Deceased   Sister  Alive   Neg Hx  (Not Specified)  No partnership data on file   Family History  Problem Relation Age of Onset   Colon polyps Mother    Diabetes Mother    Pneumonia Father    Cancer Father        skin   Colon polyps Sister    Cancer Sister        skin   Colon cancer Neg Hx    Esophageal cancer Neg Hx    Rectal cancer Neg Hx    Stomach cancer Neg Hx    Allergies  Allergen Reactions   Amoxicillin -Pot Clavulanate Other (See Comments)    Urinary retention   Cephalexin  Nausea And Vomiting    Internal head pressure and nausea   Doxycycline Nausea And Vomiting    Gi and internal head pressure   Nitrofurantoin Nausea And Vomiting    Gi upset and internal head pressure   Ciprofloxacin  Hcl Nausea Only and Other (See Comments)    Abdominal pain, anxiety, facial rash   Naprosyn [Naproxen]     gerd   Other Other (See Comments)    Family History of Reaction   Shingrix [Zoster Vac Recomb Adjuvanted]     Family History of Reaction   Sulfonamide Derivatives     REACTION: skin turns red   Xylocaine [Lidocaine Hcl]     Real red, hot and flushed all over. Pt reports the redness was from being nervous and wasn't actually a reaction.       Review of Systems  Constitutional:  Negative for chills and fever.  HENT:  Negative for nosebleeds and sore throat.   Respiratory:   Negative for cough and shortness of breath.   Cardiovascular:  Negative for chest pain and leg swelling.  Gastrointestinal:  Negative for abdominal pain, blood in stool, diarrhea, nausea and vomiting.  Genitourinary:  Positive for frequency. Negative for dysuria and flank pain.  Skin:  Negative for itching and rash.  Neurological:  Negative for dizziness and headaches.   Negative unless indicated in HPI   Objective:  BP (!) 141/75   Pulse 86   Temp (!) 97.3 F (36.3 C) (Temporal)   Ht 5' 4 (1.626 m)   Wt 202 lb 6.4 oz (91.8 kg)   SpO2 99%   BMI 34.74 kg/m  BP Readings from Last 3 Encounters:  04/08/24 (!) 141/75  04/05/24 (!) 143/65  03/27/24 (!) 154/64   Wt Readings from Last 3 Encounters:  04/08/24 202 lb 6.4 oz (91.8 kg)  04/05/24 201 lb 1 oz (91.2 kg)  03/27/24 201 lb (91.2 kg)      Physical Exam Vitals reviewed.  Constitutional:      General: She is not in acute distress. HENT:     Head: Normocephalic and atraumatic.     Nose: Nose normal.  Eyes:     General: No scleral icterus.    Extraocular Movements: Extraocular movements intact.     Conjunctiva/sclera: Conjunctivae normal.     Pupils: Pupils are equal, round, and reactive to light.  Cardiovascular:     Heart sounds: Normal heart sounds.  Pulmonary:     Effort: Pulmonary effort is normal.     Breath sounds: Normal breath sounds.  Abdominal:     General: Bowel sounds are normal.  Musculoskeletal:        General: Normal range of motion.     Right lower leg: No edema.     Left lower leg: No edema.  Skin:    General: Skin is warm and dry.     Findings: No rash.  Neurological:     Mental Status: She is alert and oriented to person, place, and time.  Psychiatric:        Mood and Affect: Mood normal.        Behavior: Behavior normal.        Thought Content: Thought content normal.        Judgment: Judgment normal.      No results found for any visits on 04/08/24.  Last CBC Lab Results   Component Value Date   WBC 4.7 04/05/2024   HGB 13.2 04/05/2024   HCT 38.2 04/05/2024   MCV 91.6 04/05/2024   MCH 31.7 04/05/2024   RDW 13.0 04/05/2024   PLT 84 (L) 04/05/2024   Last metabolic panel Lab Results  Component Value Date   GLUCOSE 104 (H) 04/05/2024   NA 129 (L) 04/05/2024   K 3.6 04/05/2024   CL 97 (L) 04/05/2024   CO2 21 (L) 04/05/2024   BUN 8 04/05/2024   CREATININE 0.71 04/05/2024   GFRNONAA >60 04/05/2024   CALCIUM  9.2 04/05/2024   PHOS 3.7 11/24/2010   PROT 7.4 04/05/2024   ALBUMIN 3.9 04/05/2024   LABGLOB 2.8 01/06/2024   AGRATIO 1.7 12/31/2022   BILITOT 1.4 (H) 04/05/2024   ALKPHOS 161 (H) 04/05/2024   AST 38 04/05/2024   ALT 19 04/05/2024   ANIONGAP 11 04/05/2024   Last lipids Lab Results  Component Value Date   CHOL 201 (H) 01/06/2024   HDL 58 01/06/2024   LDLCALC 128 (H) 01/06/2024   TRIG 82 01/06/2024   CHOLHDL 3.5 01/06/2024   Last hemoglobin A1c Lab Results  Component Value Date   HGBA1C 5.0 07/07/2023   Last thyroid  functions Lab Results  Component Value Date   TSH 3.480 01/06/2024   T4TOTAL 10.6 01/06/2024        Assessment & Plan:  Diarrhea of infectious origin  Acute cystitis without hematuria -     Fosfomycin Tromethamine   Hospital discharge follow-up  Kelly Lewis is a 74 yrs old female seen today fro post hospital dc/ no acute distress UTI: fosfomycin 1 dose 3 g, increase hydration; continue cranberry pills The above assessment and management plan was discussed with the patient. The patient verbalized understanding of and has agreed to the management plan. Patient is aware to call the clinic if they develop any new symptoms or if symptoms persist or worsen. Patient is aware when to return to the clinic for a follow-up visit. Patient educated on when it is appropriate to go to the emergency department.  Return if symptoms worsen or fail to improve.   Kelly Winthrop [place] Thompson, DNP [facility] [address] [place], [place] [address] [phone]    Note: This document was prepared by [employer] voice dictation technology and any errors that results from this process are unintentional.

## 2024-04-08 NOTE — Progress Notes (Signed)
 ED Antimicrobial Stewardship Positive Culture Follow Up   Kelly Lewis is an 73 y.o. female who presented to [facility] on 04/05/2024 with a chief complaint of  Chief Complaint  Patient presents with   Diarrhea    Recent Results (from the past 720 hours)  Urine Culture     Status: Abnormal   Collection Time: 04/05/24  1:10 PM   Specimen: Urine, Clean Catch  Result Value Ref Range Status   Specimen Description   Final    URINE, CLEAN CATCH Performed at [facility], [address], [place], [place] [address]    Special Requests   Final    NONE Performed at [facility], [address], [place], [place] [address]    Culture >=100,000 COLONIES/mL ESCHERICHIA COLI (A)  Final   Report Status 04/07/2024 FINAL  Final   Organism ID, Bacteria ESCHERICHIA COLI (A)  Final      Susceptibility   Escherichia coli - MIC*    AMPICILLIN 4 SENSITIVE Sensitive     CEFAZOLIN <=4 SENSITIVE Sensitive     CEFEPIME <=0.12 SENSITIVE Sensitive     CEFTRIAXONE  <=0.25 SENSITIVE Sensitive     CIPROFLOXACIN  <=0.25 SENSITIVE Sensitive     GENTAMICIN <=1 SENSITIVE Sensitive     IMIPENEM <=0.25 SENSITIVE Sensitive     NITROFURANTOIN <=16 SENSITIVE Sensitive     TRIMETH/SULFA <=20 SENSITIVE Sensitive     AMPICILLIN/SULBACTAM <=2 SENSITIVE Sensitive     PIP/TAZO <=4 SENSITIVE Sensitive ug/mL    * >=100,000 COLONIES/mL ESCHERICHIA COLI  Gastrointestinal Panel by PCR , Stool     Status: None   Collection Time: 04/06/24 11:54 AM   Specimen: Stool  Result Value Ref Range Status   Campylobacter species NOT DETECTED NOT DETECTED Final   Plesimonas shigelloides NOT DETECTED NOT DETECTED Final   Salmonella species NOT DETECTED NOT DETECTED Final   Yersinia enterocolitica NOT DETECTED NOT DETECTED Final   Vibrio species NOT DETECTED NOT DETECTED Final   Vibrio cholerae NOT DETECTED NOT DETECTED Final   Enteroaggregative E coli (EAEC) NOT DETECTED NOT DETECTED Final   Enteropathogenic E coli (EPEC) NOT DETECTED  NOT DETECTED Final   Enterotoxigenic E coli (ETEC) NOT DETECTED NOT DETECTED Final   Shiga like toxin producing E coli (STEC) NOT DETECTED NOT DETECTED Final   Shigella/Enteroinvasive E coli (EIEC) NOT DETECTED NOT DETECTED Final   Cryptosporidium NOT DETECTED NOT DETECTED Final   Cyclospora cayetanensis NOT DETECTED NOT DETECTED Final   Entamoeba histolytica NOT DETECTED NOT DETECTED Final   Giardia lamblia NOT DETECTED NOT DETECTED Final   Adenovirus F40/41 NOT DETECTED NOT DETECTED Final   Astrovirus NOT DETECTED NOT DETECTED Final   Norovirus GI/GII NOT DETECTED NOT DETECTED Final   Rotavirus A NOT DETECTED NOT DETECTED Final   Sapovirus (I, II, IV, and V) NOT DETECTED NOT DETECTED Final    Comment: Performed at [facility], [address], [place], [place] [address]    Patient to follow-up with PCP for stool specimen. No urinary symptoms noted. Antibiotics not indicated.  ED Provider: Lonni Camp PA-C   Adeel Guiffre 04/08/2024, 9:59 AM Clinical Pharmacist Monday - Friday phone -  [phone] Saturday - Sunday phone - [phone]

## 2024-04-08 NOTE — Telephone Encounter (Signed)
 Copied from CRM [id number]. Topic: General - Other >> Apr 08, 2024 11:28 AM Turkey B wrote: Reason for CRM: Patient called in checking status of med, fosfomycin (MONUROL ) packet 3 g  that was sent today, should be for [facility]. Please cb with status

## 2024-04-12 ENCOUNTER — Ambulatory Visit: Payer: Medicare PPO | Admitting: Physician Assistant

## 2024-04-12 ENCOUNTER — Inpatient Hospital Stay: Attending: Hematology

## 2024-04-12 DIAGNOSIS — I10 Essential (primary) hypertension: Secondary | ICD-10-CM | POA: Insufficient documentation

## 2024-04-12 DIAGNOSIS — K7460 Unspecified cirrhosis of liver: Secondary | ICD-10-CM | POA: Insufficient documentation

## 2024-04-12 DIAGNOSIS — R161 Splenomegaly, not elsewhere classified: Secondary | ICD-10-CM | POA: Insufficient documentation

## 2024-04-12 DIAGNOSIS — D509 Iron deficiency anemia, unspecified: Secondary | ICD-10-CM | POA: Diagnosis not present

## 2024-04-12 DIAGNOSIS — E039 Hypothyroidism, unspecified: Secondary | ICD-10-CM | POA: Diagnosis not present

## 2024-04-12 DIAGNOSIS — K7581 Nonalcoholic steatohepatitis (NASH): Secondary | ICD-10-CM | POA: Diagnosis not present

## 2024-04-12 DIAGNOSIS — K219 Gastro-esophageal reflux disease without esophagitis: Secondary | ICD-10-CM | POA: Diagnosis not present

## 2024-04-12 DIAGNOSIS — D696 Thrombocytopenia, unspecified: Secondary | ICD-10-CM | POA: Diagnosis not present

## 2024-04-12 DIAGNOSIS — E611 Iron deficiency: Secondary | ICD-10-CM

## 2024-04-12 LAB — CBC WITH DIFFERENTIAL/PLATELET
Abs Immature Granulocytes: 0 K/uL (ref 0.00–0.07)
Basophils Absolute: 0 K/uL (ref 0.0–0.1)
Basophils Relative: 1 %
Eosinophils Absolute: 0 K/uL (ref 0.0–0.5)
Eosinophils Relative: 1 %
HCT: 38.6 % (ref 36.0–46.0)
Hemoglobin: 13.2 g/dL (ref 12.0–15.0)
Immature Granulocytes: 0 %
Lymphocytes Relative: 25 %
Lymphs Abs: 0.8 K/uL (ref 0.7–4.0)
MCH: 31.3 pg (ref 26.0–34.0)
MCHC: 34.2 g/dL (ref 30.0–36.0)
MCV: 91.5 fL (ref 80.0–100.0)
Monocytes Absolute: 0.2 K/uL (ref 0.1–1.0)
Monocytes Relative: 7 %
Neutro Abs: 2.1 K/uL (ref 1.7–7.7)
Neutrophils Relative %: 66 %
Platelets: 76 K/uL — ABNORMAL LOW (ref 150–400)
RBC: 4.22 MIL/uL (ref 3.87–5.11)
RDW: 13.2 % (ref 11.5–15.5)
WBC: 3.2 K/uL — ABNORMAL LOW (ref 4.0–10.5)
nRBC: 0 % (ref 0.0–0.2)

## 2024-04-12 LAB — IRON AND TIBC
Iron: 79 ug/dL (ref 28–170)
Saturation Ratios: 21 % (ref 10.4–31.8)
TIBC: 378 ug/dL (ref 250–450)
UIBC: 299 ug/dL

## 2024-04-12 LAB — FERRITIN: Ferritin: 36 ng/mL (ref 11–307)

## 2024-04-16 NOTE — Progress Notes (Signed)
 Kelly Lewis

## 2024-04-19 NOTE — Progress Notes (Unsigned)
 [facility] [address], [place] [address]   CLINIC:  Medical Oncology/Hematology  PCP:  Gladis Mustard, FNP [address] [place] [address] [phone]  REASON FOR VISIT:  Follow-up for thrombocytopenia (liver cirrhosis) and iron deficiency anemia   PRIOR THERAPY: None   CURRENT THERAPY: Observation  INTERVAL HISTORY:  Ms. Kelly Lewis is contacted today for follow-up of thrombocytopenia and iron deficiency anemia.  She was last seen by Pleasant Barefoot PA-C on 10/14/2023.  Patient reports some increased anxiety lately, since her husband Rutha was diagnosed with lung cancer in the interim since last visit, and is recovering from surgery while also preparing to undergo chemotherapy.  In the interim since last visit, patient had contacted Cancer Center on 04/05/2024 due to concerns regarding explosive diarrhea, that she thought may have been related to her platelets.  She did have some black stool during this episode, but testing in the ED was heme-negative.  She was diagnosed with UTI, and symptoms have resolved.   She reports occasional scant rectal bleeding from hemorrhoid, but denies any gross hematochezia, melena, epistaxis, or hematuria. She has occasional mild gum bleeding. She denies any easy bruising or petechial rash. She has not had any B symptoms such as fever, chills, night sweats, or unintentional weight loss.   She has been trying to eat iron rich foods, since she was unable to tolerate oral iron supplement.  She denies any pica, severe headaches, or restless legs.   Overall, she reports that her energy is good, but that she has some intermittent fatigue related to back pain and right leg pain that limits her activities.   Her weight has been stable since her last visit.   She has 100% energy and 75% appetite.  ASSESSMENT & PLAN:  1.  Thrombocytopenia & leukopenia - Review of past labs shows normal CBC in 2012, with onset  of thrombocytopenia in 2018, which has been progressive since that time.   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  Negative H. pylori, hepatitis B, hepatitis C, HIV.  Nutritional panel unremarkable with normal B12, folate, methylmalonic acid, copper .  ANA and rheumatoid factor negative.  LDH normal. - No bleeding, bruising, petechial rash     - No unexplained fever, chills, night sweats, weight loss.  No new lumps or bumps.       - No lymphadenopathy or hepatosplenomegaly palpated on exam  - Most recent CBC (04/12/2024): Platelets 76, WBC 3.2 with normal differential.  Blood counts at baseline. - Differential diagnosis favors thrombocytopenia and leukopenia secondary to cirrhosis and splenic sequestration.  If worsening unexplained abnormalities in CBC, would consider bone marrow biopsy for further work-up. - PLAN: Platelets and WBC are at baseline.  No indication for treatment at this time, but would consider further treatment if platelets were persistently less than 30.     2.  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  She has never had IV iron. - Most recent labs (04/12/2024): Hgb 13.2, ferritin 36, iron saturation 21% - PLAN: We will hold off on IV iron, since she is asymptomatic and without anemia, per patient preference - Repeat CBC and iron panel in 6 months.  (Once iron levels are stabilized will try to stretch to annual visits)  3.   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  Radiology recommendations for follow-up renal sonogram in 6 months to assess stability. - Renal ultrasound (02/11/2022): Stable 8 mm hyperechoic focus in the midpole cortex of right kidney which may represent angiomyolipoma.  Follow-up ultrasound in 12 months recommended to ensure stability. - Abdominal US  today (04/09/23): Right kidney shows no evidence of mass or hydronephrosis.  She does have 6 mm stone identified.   4.  Other history - PMH: Osteoarthritis, hypothyroidism, hypertension, anxiety, GERD.  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 Aneita of  GI. - SOCIAL: Retired Runner, broadcasting/film/video.  Lives with her husband.  No tobacco, alcohol, drugs. - FAMILY: Father with NASH.  Father and sister with melanoma.   PLAN SUMMARY:  >> Labs in 6 months = CBC/D, ferritin, iron/TIBC >> OFFICE visit in 6 months (1 week after labs)      REVIEW OF SYSTEMS:   Review of Systems  Constitutional:  Positive for fatigue. Negative for appetite change, chills, diaphoresis, fever and unexpected weight change.  HENT:   Negative for lump/mass and nosebleeds.   Eyes:  Negative for eye problems.  Respiratory:  Negative for cough, hemoptysis and shortness of breath.   Cardiovascular:  Negative for chest pain, leg swelling and palpitations.  Gastrointestinal:  Negative for abdominal  pain, blood in stool, constipation, diarrhea, nausea and vomiting.  Genitourinary:  Negative for hematuria.   Musculoskeletal:  Positive for arthralgias.  Skin: Negative.   Neurological:  Negative for dizziness, headaches and light-headedness.  Hematological:  Does not bruise/bleed easily.  Psychiatric/Behavioral:  The patient is nervous/anxious.      PHYSICAL EXAM:  ECOG PERFORMANCE STATUS: 1 - Symptomatic but completely ambulatory  Vitals:   04/20/24 0915 04/20/24 0921  BP: (!) 153/75 139/71  Pulse: 79   Resp: 18   Temp: 98.1 F (36.7 C)   SpO2: 98%    Filed Weights   04/20/24 0915  Weight: 200 lb 6.4 oz (90.9 kg)   Physical Exam Constitutional:      Appearance: Normal appearance. She is obese.  Cardiovascular:     Heart sounds: Normal heart sounds.  Pulmonary:     Breath sounds: Normal breath sounds.  Neurological:     General: No focal deficit present.     Mental Status: Mental status is at baseline.  Psychiatric:        Behavior: Behavior normal. Behavior is cooperative.     PAST MEDICAL/SURGICAL HISTORY:  Past Medical History:  Diagnosis Date   Arthritis    Chronic anxiety    GERD (gastroesophageal reflux disease)    H/O degenerative disc disease    Heart murmur    Hyperlipidemia    Hypertension  Non-alcoholic fatty liver disease    Osteopenia 2021   Plantar fasciitis    Temporary low platelet count (HCC) 2022   94K then up to 104 in May of 2022   Thyroid  disease    Past Surgical History:  Procedure Laterality Date   bladder tacked  1984   COLONOSCOPY  2011   1- TA   UPPER GASTROINTESTINAL ENDOSCOPY  2012   at [facility], normal    SOCIAL HISTORY:  Social History   Socioeconomic History   Marital status: Married    Spouse name: Rutha   Number of children: 2   Years of education: [employer] education level: Bachelor's degree (e.g., BA, AB, BS)  Occupational History   Occupation: Retired   Tobacco Use   Smoking status: Never     Passive exposure: Past (husband usually smokes outside or in vehicle)   Smokeless tobacco: Never  Vaping Use   Vaping status: Never Used  Substance and Sexual Activity   Alcohol use: No   Drug use: No   Sexual activity: Not Currently  Other Topics Concern   Not on file  Social History Narrative   Not on file   Social Drivers of Health   Financial Resource Strain: Low Risk  (02/25/2024)   Overall Financial Resource Strain (CARDIA)    Difficulty of Paying Living Expenses: Not hard at all  Food Insecurity: No Food Insecurity (02/25/2024)   Hunger Vital Sign    Worried About Running Out of Food in the Last Year: Never true    Ran Out of Food in the Last Year: Never true  Transportation Needs: No Transportation Needs (02/25/2024)   PRAPARE - Administrator, Civil Service (Medical): No    Lack of Transportation (Non-Medical): No  Physical Activity: Unknown (02/25/2024)   Exercise Vital Sign    Days of Exercise per Week: 3 days    Minutes of Exercise per Session: Not on file  Stress: No Stress Concern Present (02/25/2024)   [employer] of Occupational Health - Occupational Stress Questionnaire    Feeling of Stress: Only a little  Social Connections: Socially Integrated (02/25/2024)   Social Connection and Isolation Panel    Frequency of Communication with Friends and Family: More than three times a week    Frequency of Social Gatherings with Friends and Family: Three times a week    Attends Religious Services: More than 4 times per year    Active Member of Clubs or Organizations: Yes    Attends Banker Meetings: 1 to 4 times per year    Marital Status: Married  Catering manager Violence: Not At Risk (01/11/2019)   Humiliation, Afraid, Rape, and Kick questionnaire    Fear of Current or Ex-Partner: No    Emotionally Abused: No    Physically Abused: No    Sexually Abused: No    FAMILY HISTORY:  Family History  Problem Relation Age of Onset   Colon  polyps Mother    Diabetes Mother    Pneumonia Father    Cancer Father        skin   Colon polyps Sister    Cancer Sister        skin   Colon cancer Neg Hx    Esophageal cancer Neg Hx    Rectal cancer Neg Hx    Stomach cancer Neg Hx     CURRENT MEDICATIONS:  Outpatient Encounter Medications as of 04/20/2024  Medication Sig   Cholecalciferol  (VITAMIN  D3) 2000 UNITS TABS Take 2,000 Units by mouth daily.    Cranberry 300 MG tablet Take 300 mg by mouth daily. Utiva supplement , cranberry extract 36 PAC   fosfomycin (MONUROL ) 3 g PACK Take 3 g by mouth once.   ibuprofen (ADVIL,MOTRIN) 200 MG tablet Take 200 mg by mouth every 6 (six) hours as needed for mild pain.   levothyroxine  (SYNTHROID ) 88 MCG tablet Take 1 tablet (88 mcg total) by mouth daily.   lisinopril  (ZESTRIL ) 10 MG tablet Take 1 tablet (10 mg total) by mouth daily.   LORazepam  (ATIVAN ) 0.5 MG tablet Take 1 tablet (0.5 mg total) by mouth 2 (two) times daily as needed for anxiety.   nystatin  cream (MYCOSTATIN ) Apply 1 Application topically 2 (two) times daily.   omeprazole  (PRILOSEC ) 20 MG capsule Take 1 capsule (20 mg total) by mouth daily.   ondansetron  (ZOFRAN ) 4 MG tablet TAKE 1 TABLET (4 MG TOTAL) BY MOUTH 2 (TWO) TIMES DAILY AS NEEDED FOR NAUSEA OR VOMITING.   Probiotic Product (ALIGN) 4 MG CAPS Take by mouth.   vitamin B-12 (CYANOCOBALAMIN ) 1000 MCG tablet Take 1,000 mcg by mouth daily.   vitamin C (ASCORBIC ACID) 500 MG tablet Take 500 mg by mouth daily.   gabapentin  (NEURONTIN ) 300 MG capsule Take 1 capsule (300 mg total) by mouth 3 (three) times daily. (Patient not taking: Reported on 04/20/2024)   Facility-Administered Encounter Medications as of 04/20/2024  Medication   0.9 %  sodium chloride  infusion    ALLERGIES:  Allergies  Allergen Reactions   Amoxicillin -Pot Clavulanate Other (See Comments)    Urinary retention   Cephalexin  Nausea And Vomiting    Internal head pressure and nausea   Doxycycline Nausea And  Vomiting    Gi and internal head pressure   Nitrofurantoin Nausea And Vomiting    Gi upset and internal head pressure   Ciprofloxacin  Hcl Nausea Only and Other (See Comments)    Abdominal pain, anxiety, facial rash   Naprosyn [Naproxen]     gerd   Other Other (See Comments)    Family History of Reaction   Shingrix [Zoster Vac Recomb Adjuvanted]     Family History of Reaction   Sulfonamide Derivatives     REACTION: skin turns red   Xylocaine [Lidocaine Hcl]     Real red, hot and flushed all over. Pt reports the redness was from being nervous and wasn't actually a reaction.     LABORATORY DATA:  I have reviewed the labs as listed.  CBC    Component Value Date/Time   WBC 3.2 (L) 04/12/2024 0825   RBC 4.22 04/12/2024 0825   HGB 13.2 04/12/2024 0825   HGB 13.3 01/06/2024 1005   HCT 38.6 04/12/2024 0825   HCT 40.9 01/06/2024 1005   PLT 76 (L) 04/12/2024 0825   PLT 75 (LL) 01/06/2024 1005   MCV 91.5 04/12/2024 0825   MCV 93 01/06/2024 1005   MCH 31.3 04/12/2024 0825   MCHC 34.2 04/12/2024 0825   RDW 13.2 04/12/2024 0825   RDW 12.6 01/06/2024 1005   LYMPHSABS 0.8 04/12/2024 0825   LYMPHSABS 0.8 01/06/2024 1005   MONOABS 0.2 04/12/2024 0825   EOSABS 0.0 04/12/2024 0825   EOSABS 0.0 01/06/2024 1005   BASOSABS 0.0 04/12/2024 0825   BASOSABS 0.0 01/06/2024 1005      Latest Ref Rng & Units 04/05/2024   12:02 PM 01/06/2024   10:05 AM 10/07/2023    7:39 AM  CMP  Glucose 70 -  99 mg/dL 895  99  898   BUN 8 - 23 mg/dL 8  12  13    Creatinine 0.44 - 1.00 mg/dL 9.28  9.13  9.24   Sodium 135 - 145 mmol/L 129  133  133   Potassium 3.5 - 5.1 mmol/L 3.6  4.7  4.2   Chloride 98 - 111 mmol/L 97  96  100   CO2 22 - 32 mmol/L 21  21  25    Calcium  8.9 - 10.3 mg/dL 9.2  9.8  9.5   Total Protein 6.5 - 8.1 g/dL 7.4  7.2  7.1   Total Bilirubin 0.0 - 1.2 mg/dL 1.4  0.9  0.8   Alkaline Phos 38 - 126 U/L 161  186  142   AST 15 - 41 U/L 38  29  27   ALT 0 - 44 U/L 19  15  15      DIAGNOSTIC  IMAGING:  I have independently reviewed the relevant imaging and discussed with the patient.   WRAP UP:  All questions were answered. The patient knows to call the clinic with any problems, questions or concerns.  Medical decision making: Moderate  Time spent on visit: I spent 20 minutes counseling the patient face to face. The total time spent in the appointment was 30 minutes and more than 50% was on counseling.  Pleasant CHRISTELLA Barefoot, PA-C  04/20/24 10:01 AM

## 2024-04-20 ENCOUNTER — Inpatient Hospital Stay: Payer: Medicare PPO | Admitting: Physician Assistant

## 2024-04-20 ENCOUNTER — Encounter: Payer: Self-pay | Admitting: Physician Assistant

## 2024-04-20 DIAGNOSIS — R161 Splenomegaly, not elsewhere classified: Secondary | ICD-10-CM | POA: Diagnosis not present

## 2024-04-20 DIAGNOSIS — E611 Iron deficiency: Secondary | ICD-10-CM

## 2024-04-20 DIAGNOSIS — I10 Essential (primary) hypertension: Secondary | ICD-10-CM | POA: Diagnosis not present

## 2024-04-20 DIAGNOSIS — K7460 Unspecified cirrhosis of liver: Secondary | ICD-10-CM | POA: Diagnosis not present

## 2024-04-20 DIAGNOSIS — D696 Thrombocytopenia, unspecified: Secondary | ICD-10-CM | POA: Diagnosis not present

## 2024-04-20 DIAGNOSIS — K219 Gastro-esophageal reflux disease without esophagitis: Secondary | ICD-10-CM | POA: Diagnosis not present

## 2024-04-20 DIAGNOSIS — E039 Hypothyroidism, unspecified: Secondary | ICD-10-CM | POA: Diagnosis not present

## 2024-04-20 DIAGNOSIS — K7581 Nonalcoholic steatohepatitis (NASH): Secondary | ICD-10-CM | POA: Diagnosis not present

## 2024-04-20 DIAGNOSIS — D509 Iron deficiency anemia, unspecified: Secondary | ICD-10-CM | POA: Diagnosis not present

## 2024-04-20 NOTE — Patient Instructions (Signed)
 [facility] at [facility] Discharge Instructions  You were seen today by Pleasant Barefoot PA-C for your LOW PLATELETS and LOW IRON.  Your low platelets and low white blood cells are related to your liver disease.  Your levels are overall stable. We will continue to monitor this.  Your iron levels are low, but since you are not having any symptoms of iron deficiency (severe fatigue, etc), we will hold off on giving any IV iron at this time.  Continue to focus on iron-rich foods.    LABS: Return in 6 months for repeat labs   FOLLOW-UP APPOINTMENT: Office visit after labs   ** Thank you for trusting me with your healthcare!  I strive to provide all of my patients with quality care at each visit.  If you receive a survey for this visit, I would be so grateful to you for taking the time to provide feedback.  Thank you in advance!  ~ Herrick Hartog                                        Dr. Mickiel Davonna Pleasant Barefoot, PA-C     Delon Hope, NP   - - - - - - - - - - - - - - - - - -     Thank you for choosing [facility] at [facility] to provide your oncology and hematology care.  To afford each patient quality time with our provider, please arrive at least 15 minutes before your scheduled appointment time.   If you have a lab appointment with the Cancer Center please come in thru the Main Entrance and check in at the main information desk.  You need to re-schedule your appointment should you arrive 10 or more minutes late.  We strive to give you quality time with our providers, and arriving late affects you and other patients whose appointments are after yours.  Also, if you no show three or more times for appointments you may be dismissed from the clinic at the providers discretion.     Again, thank you for choosing [facility].  Our hope is that these requests will decrease the amount of time that you wait before being seen by  our physicians.       _____________________________________________________________  Should you have questions after your visit to [facility], please contact our office at [phone] and follow the prompts.  Our office hours are 8:00 a.m. and 4:30 p.m. Monday - Friday.  Please note that voicemails left after 4:00 p.m. may not be returned until the following business day.  We are closed weekends and major holidays.  You do have access to a nurse 24-7, just call the main number to the clinic [phone] and do not press any options, hold on the line and a nurse will answer the phone.    For prescription refill requests, have your pharmacy contact our office and allow 72 hours.    Due to Covid, you will need to wear a mask upon entering the hospital. If you do not have a mask, a mask will be given to you at the Main Entrance upon arrival. For doctor visits, patients may have 1 support person age 73 or older with them. For treatment visits, patients can not have anyone with them due to social distancing  guidelines and our immunocompromised population.

## 2024-06-23 ENCOUNTER — Telehealth: Payer: Self-pay

## 2024-06-23 DIAGNOSIS — K7460 Unspecified cirrhosis of liver: Secondary | ICD-10-CM

## 2024-06-23 NOTE — Telephone Encounter (Signed)
 US  & lab order in epic. MyChart message sent to patient with reminder.

## 2024-06-23 NOTE — Telephone Encounter (Signed)
-----   Message from Nurse [place] B sent at 12/23/2023  8:29 AM EDT ----- Regarding: US  and Labs due Abdominal ultrasound and AFP due - Dr. Suzann - Cirrhosis/HCC screening

## 2024-06-23 NOTE — Telephone Encounter (Signed)
 Secure staff message sent to radiology scheduling to contact patient to set up appt.

## 2024-06-24 ENCOUNTER — Other Ambulatory Visit

## 2024-06-24 DIAGNOSIS — K7460 Unspecified cirrhosis of liver: Secondary | ICD-10-CM

## 2024-06-25 ENCOUNTER — Ambulatory Visit (HOSPITAL_COMMUNITY)
Admission: RE | Admit: 2024-06-25 | Discharge: 2024-06-25 | Disposition: A | Discharge status: Home / Self Care | Source: Ambulatory Visit | Attending: Pediatrics | Admitting: Pediatrics

## 2024-06-25 DIAGNOSIS — K7460 Unspecified cirrhosis of liver: Secondary | ICD-10-CM | POA: Diagnosis not present

## 2024-06-28 ENCOUNTER — Ambulatory Visit: Payer: Self-pay | Admitting: Pediatrics

## 2024-06-28 LAB — AFP TUMOR MARKER: AFP-Tumor Marker: 3.4 ng/mL

## 2024-07-03 ENCOUNTER — Other Ambulatory Visit: Payer: Self-pay | Admitting: Pediatrics

## 2024-07-08 ENCOUNTER — Encounter: Payer: Self-pay | Admitting: Nurse Practitioner

## 2024-07-08 ENCOUNTER — Ambulatory Visit: Payer: Self-pay | Admitting: Nurse Practitioner

## 2024-07-08 VITALS — BP 125/70 | HR 72 | Temp 97.5°F | Ht 64.0 in | Wt 196.0 lb

## 2024-07-08 DIAGNOSIS — I10 Essential (primary) hypertension: Secondary | ICD-10-CM | POA: Diagnosis not present

## 2024-07-08 DIAGNOSIS — E039 Hypothyroidism, unspecified: Secondary | ICD-10-CM

## 2024-07-08 DIAGNOSIS — D696 Thrombocytopenia, unspecified: Secondary | ICD-10-CM | POA: Diagnosis not present

## 2024-07-08 DIAGNOSIS — F411 Generalized anxiety disorder: Secondary | ICD-10-CM

## 2024-07-08 DIAGNOSIS — Z23 Encounter for immunization: Secondary | ICD-10-CM

## 2024-07-08 DIAGNOSIS — K219 Gastro-esophageal reflux disease without esophagitis: Secondary | ICD-10-CM | POA: Diagnosis not present

## 2024-07-08 DIAGNOSIS — Z6834 Body mass index (BMI) 34.0-34.9, adult: Secondary | ICD-10-CM | POA: Diagnosis not present

## 2024-07-08 MED ORDER — LEVOTHYROXINE SODIUM 88 MCG PO TABS: 88.0000 ug | ORAL_TABLET | Freq: Every day | ORAL | 1 refills | Status: AC

## 2024-07-08 MED ORDER — OMEPRAZOLE 20 MG PO CPDR: 20.0000 mg | DELAYED_RELEASE_CAPSULE | Freq: Every day | ORAL | 1 refills | Status: AC

## 2024-07-08 MED ORDER — LISINOPRIL 10 MG PO TABS: 10.0000 mg | ORAL_TABLET | Freq: Every day | ORAL | 1 refills | Status: AC

## 2024-07-08 MED ORDER — LORAZEPAM 0.5 MG PO TABS: 0.5000 mg | ORAL_TABLET | Freq: Two times a day (BID) | ORAL | 5 refills | Status: AC | PRN

## 2024-07-08 NOTE — Progress Notes (Signed)
 Subjective:    Patient ID: Kelly Lewis, female    DOB: [birth year], 73 y.o.   MRN: [id number]   Chief Complaint: medical management of chronic issues     HPI:  Kelly Lewis is a 73 y.o. who identifies as a female who was assigned female at birth.   Social history: Lives with: husband Work history: retired   Water Engineer in today for follow up of the following chronic medical issues:  1. Primary hypertension No c/o chest pain, sob or headache. Does not check blood pressure at home. BP Readings from Last 3 Encounters:  04/20/24 139/71  04/08/24 (!) 141/75  04/05/24 (!) 143/65     2. Gastroesophageal reflux disease without esophagitis Is on omeprazole  and is doing well.  3. Acquired hypothyroidism No issues that she is aware of. Lab Results  Component Value Date   TSH 3.480 01/06/2024     4. Thrombocytopenia (HCC) She has seen specialist and they cannot figure our why her platelets are low. They think it may be coming form her liver. Possible NASH. Saw hematologist in August and they said to just watch for now. Lab Results  Component Value Date   PLT 76 (L) 04/12/2024     5. GAD (generalized anxiety disorder) Is on ativan  and is doing well.    07/08/2024    9:51 AM 02/26/2024    8:11 AM 07/07/2023   10:51 AM 12/31/2022   10:59 AM  GAD 7 : Generalized Anxiety Score  Nervous, Anxious, on Edge 0 1 0 0  Control/stop worrying 0 0 0 0  Worry too much - different things 0 0 0 0  Trouble relaxing 0 0 0 0  Restless 0 0 0 0  Easily annoyed or irritable 0 0 0 0  Afraid - awful might happen 0 0 0 0  Total GAD 7 Score 0 1 0 0  Anxiety Difficulty Not difficult at all Not difficult at all Not difficult at all Not difficult at all        6. BMI 34.0-34.9,adult Weight is down 4lbs  Wt Readings from Last 3 Encounters:  07/08/24 196 lb (88.9 kg)  04/20/24 200 lb 6.4 oz (90.9 kg)  04/08/24 202 lb 6.4 oz (91.8 kg)   BMI Readings from Last 3 Encounters:  07/08/24 33.64  kg/m  04/20/24 34.40 kg/m  04/08/24 34.74 kg/m       New complaints: None today  Allergies  Allergen Reactions   Amoxicillin -Pot Clavulanate Other (See Comments)    Urinary retention   Cephalexin  Nausea And Vomiting    Internal head pressure and nausea   Doxycycline Nausea And Vomiting    Gi and internal head pressure   Nitrofurantoin Nausea And Vomiting    Gi upset and internal head pressure   Ciprofloxacin  Hcl Nausea Only and Other (See Comments)    Abdominal pain, anxiety, facial rash   Naprosyn [Naproxen]     gerd   Other Other (See Comments)    Family History of Reaction   Shingrix [Zoster Vac Recomb Adjuvanted]     Family History of Reaction   Sulfonamide Derivatives     REACTION: skin turns red   Xylocaine [Lidocaine Hcl]     Real red, hot and flushed all over. Pt reports the redness was from being nervous and wasn't actually a reaction.    Outpatient Encounter Medications as of 07/08/2024  Medication Sig   Cholecalciferol  (VITAMIN D3) 2000 UNITS TABS Take 2,000 Units by mouth daily.  Cranberry 300 MG tablet Take 300 mg by mouth daily. Utiva supplement , cranberry extract 36 PAC   fosfomycin (MONUROL ) 3 g PACK Take 3 g by mouth once.   gabapentin  (NEURONTIN ) 300 MG capsule Take 1 capsule (300 mg total) by mouth 3 (three) times daily. (Patient not taking: Reported on 04/20/2024)   ibuprofen (ADVIL,MOTRIN) 200 MG tablet Take 200 mg by mouth every 6 (six) hours as needed for mild pain.   levothyroxine  (SYNTHROID ) 88 MCG tablet Take 1 tablet (88 mcg total) by mouth daily.   lisinopril  (ZESTRIL ) 10 MG tablet Take 1 tablet (10 mg total) by mouth daily.   LORazepam  (ATIVAN ) 0.5 MG tablet Take 1 tablet (0.5 mg total) by mouth 2 (two) times daily as needed for anxiety.   nystatin  cream (MYCOSTATIN ) Apply 1 Application topically 2 (two) times daily.   omeprazole  (PRILOSEC ) 20 MG capsule Take 1 capsule (20 mg total) by mouth daily.   ondansetron  (ZOFRAN ) 4 MG tablet TAKE  1 TABLET (4 MG TOTAL) BY MOUTH 2 (TWO) TIMES DAILY AS NEEDED FOR NAUSEA OR VOMITING.   Probiotic Product (ALIGN) 4 MG CAPS Take by mouth.   vitamin B-12 (CYANOCOBALAMIN ) 1000 MCG tablet Take 1,000 mcg by mouth daily.   vitamin C (ASCORBIC ACID) 500 MG tablet Take 500 mg by mouth daily.   Facility-Administered Encounter Medications as of 07/08/2024  Medication   0.9 %  sodium chloride  infusion    Past Surgical History:  Procedure Laterality Date   bladder tacked  1984   COLONOSCOPY  2011   1- [facility]   UPPER GASTROINTESTINAL ENDOSCOPY  2012   at [facility], normal    Family History  Problem Relation Age of Onset   Colon polyps Mother    Diabetes Mother    Pneumonia Father    Cancer Father        skin   Colon polyps Sister    Cancer Sister        skin   Colon cancer Neg Hx    Esophageal cancer Neg Hx    Rectal cancer Neg Hx    Stomach cancer Neg Hx       Controlled substance contract: n/a     Review of Systems  Constitutional:  Negative for diaphoresis.  Eyes:  Negative for pain.  Respiratory:  Negative for shortness of breath.   Cardiovascular:  Negative for chest pain, palpitations and leg swelling.  Gastrointestinal:  Negative for abdominal pain.  Endocrine: Negative for polydipsia.  Skin:  Negative for rash.  Neurological:  Negative for dizziness, weakness and headaches.  Hematological:  Does not bruise/bleed easily.  All other systems reviewed and are negative.      Objective:   Physical Exam Vitals and nursing note reviewed.  Constitutional:      General: She is not in acute distress.    Appearance: Normal appearance. She is well-developed.  HENT:     Head: Normocephalic.     Right Ear: Tympanic membrane normal.     Left Ear: Tympanic membrane normal.     Nose: Nose normal.     Mouth/Throat:     Mouth: Mucous membranes are moist.  Eyes:     Pupils: Pupils are equal, round, and reactive to light.  Neck:     Vascular: No carotid bruit or JVD.   Cardiovascular:     Rate and Rhythm: Normal rate and regular rhythm.     Heart sounds: Normal heart sounds.  Pulmonary:     Effort: Pulmonary  effort is normal. No respiratory distress.     Breath sounds: Normal breath sounds. No wheezing or rales.  Chest:     Chest wall: No tenderness.  Abdominal:     General: Bowel sounds are normal. There is no distension or abdominal bruit.     Palpations: Abdomen is soft. There is no hepatomegaly, splenomegaly, mass or pulsatile mass.     Tenderness: There is no abdominal tenderness.  Musculoskeletal:        General: Normal range of motion.     Cervical back: Normal range of motion and neck supple.  Lymphadenopathy:     Cervical: No cervical adenopathy.  Skin:    General: Skin is warm and dry.  Neurological:     Mental Status: She is alert and oriented to person, place, and time.     Deep Tendon Reflexes: Reflexes are normal and symmetric.  Psychiatric:        Behavior: Behavior normal.        Thought Content: Thought content normal.        Judgment: Judgment normal.    BP 125/70   Pulse 72   Temp (!) 97.5 F (36.4 C) (Temporal)   Ht 5' 4 (1.626 m)   Wt 196 lb (88.9 kg)   SpO2 96%   BMI 33.64 kg/m          Assessment & Plan:   SHAQUETA CASADY comes in today with chief complaint of medical management of chronic issues    Diagnosis and orders addressed:  1. Primary hypertension Low sodium diet - CBC with Differential/Platelet - CMP14+EGFR - Lipid panel - lisinopril  (ZESTRIL ) 10 MG tablet; Take 1 tablet (10 mg total) by mouth daily.  Dispense: 90 tablet; Refill: 1  2. Gastroesophageal reflux disease without esophagitis Avoid spicy foods Do not eat 2 hours prior to bedtime - omeprazole  (PRILOSEC ) 20 MG capsule; Take 1 capsule (20 mg total) by mouth daily.  Dispense: 90 capsule; Refill: 1  3. Acquired hypothyroidism Labs pending - Thyroid  Panel With TSH - levothyroxine  (SYNTHROID ) 88 MCG tablet; Take 1 tablet (88 mcg  total) by mouth daily.  Dispense: 90 tablet; Refill: 1  4. Thrombocytopenia (HCC) Lab spending  5. GAD (generalized anxiety disorder) Stress management - LORazepam  (ATIVAN ) 0.5 MG tablet; Take 1 tablet (0.5 mg total) by mouth 2 (two) times daily as needed for anxiety.  Dispense: 60 tablet; Refill: 5  6. BMI 34.0-34.9,adult Discussed diet and exercise for person with BMI >25 Will recheck weight in 3-6 months   7. Elevated blood sugar Labs pending - Bayer DCA Hb A1c Waived   Labs pending Health Maintenance reviewed Diet and exercise encouraged  Follow up plan: 6 months   Mary-Margaret Gladis, FNP

## 2024-07-08 NOTE — Addendum Note (Signed)
 Addended by: GLADIS MUSTARD on: 07/08/2024 10:16 AM   Modules accepted: Level of Service

## 2024-07-08 NOTE — Patient Instructions (Signed)
 Low Number of Platelets (Thrombocytopenia): What to Know  Thrombocytopenia means that you have a low number of platelets. Platelets are cells in your blood. When you bleed, they clump together as a clot to stop the bleeding. If you don't have enough platelets, your blood may have trouble clotting. This may cause you to bleed inside your body. What are the causes? Having a low number of platelets in your blood may happen if: Your body doesn't make enough platelets. This may be caused by: Anemia. Leukemia. Other bone marrow diseases. Your platelets don't release into your blood. This can happen if: You have a spleen that's too large. You have Gaucher disease. Your body breaks down platelets too fast. This may be caused by: An autoimmune disease. This is a problem that affects your body's defense (immune) system. Certain medicines that thin your blood. Being exposed to harmful (toxic) chemicals. Pregnancy. You have a disease that's inherited. This means it's passed from parent to child. You've had certain cancer medicines or treatments. You have an infection from germs, such as bacteria or viruses. You drink too much alcohol. What are the signs or symptoms? Bruising easy. Bleeding from your nose or mouth. Heavy periods in females. Blood in your pee, poop, or throw-up. A purple-red color to your skin. Having very small red spots on your skin that look like a rash. How is this diagnosed? You may be diagnosed with a physical exam. You may also have tests, such as: Blood tests. A biopsy. This is when a small piece of bone marrow is removed for testing. An ultrasound or CT scan of your belly. How is this treated? Treatment may include: Treating the problem that's causing the low number of platelets. Taking medicines to stop your body from breaking down platelets too fast. Being given platelets through a transfusion. Having surgery to remove your spleen. Follow these instructions at  home: Medicines Take your medicines only as told. Do not take aspirin or ibuprofen unless you're told to. These medicines can make you more likely to bleed. Activity Do not do things that could hurt or bruise you. Be careful to avoid falls. Do not play contact sports. Ask what things are safe for you to do at home. Take care not to cut yourself: When you shave. When you use scissors or other sharp tools. General instructions  Check your skin and the inside of your mouth for bruises or blood. Check your pee and poop for blood. Do not drink alcohol if your health care provider tells you not to drink. Stay away from harmful chemicals. Tell all of your providers that you have a low number of platelets. Tell your dental care provider before you have any dental work or cleanings. Wear an alert bracelet or carry a card that says you may bleed easy. Contact a health care provider if: You have bruises that you can't explain. You have any bleeding. You have blood in your: Throw-up. Pee. Poop. You have a fever. Get help right away if: You have bleeding that you can't stop. You hit your head. You have a sudden, very bad headache. This information is not intended to replace advice given to you by your health care provider. Make sure you discuss any questions you have with your health care provider. Document Revised: 10/14/2023 Document Reviewed: 10/14/2023 Elsevier Patient Education  2025 [employer].

## 2024-07-08 NOTE — Addendum Note (Signed)
 Addended by: VIKTORIA ALAN MATSU on: 07/08/2024 10:28 AM   Modules accepted: Orders

## 2024-07-09 ENCOUNTER — Ambulatory Visit: Payer: Self-pay | Admitting: Nurse Practitioner

## 2024-07-09 LAB — CMP14+EGFR
ALT: 16 IU/L (ref 0–32)
AST: 29 IU/L (ref 0–40)
Albumin: 4.3 g/dL (ref 3.8–4.8)
Alkaline Phosphatase: 205 IU/L — ABNORMAL HIGH (ref 49–135)
BUN/Creatinine Ratio: 9 — ABNORMAL LOW (ref 12–28)
BUN: 7 mg/dL — ABNORMAL LOW (ref 8–27)
Bilirubin Total: 0.9 mg/dL (ref 0.0–1.2)
CO2: 22 mmol/L (ref 20–29)
Calcium: 10.1 mg/dL (ref 8.7–10.3)
Chloride: 98 mmol/L (ref 96–106)
Creatinine, Ser: 0.8 mg/dL (ref 0.57–1.00)
Globulin, Total: 2.8 g/dL (ref 1.5–4.5)
Glucose: 98 mg/dL (ref 70–99)
Potassium: 4.7 mmol/L (ref 3.5–5.2)
Sodium: 134 mmol/L (ref 134–144)
Total Protein: 7.1 g/dL (ref 6.0–8.5)
eGFR: 78 mL/min/1.73 (ref >59)

## 2024-07-09 LAB — CBC WITH DIFFERENTIAL/PLATELET
Basophils Absolute: 0 x10E3/uL (ref 0.0–0.2)
Basos: 1 %
EOS (ABSOLUTE): 0 x10E3/uL (ref 0.0–0.4)
Eos: 1 %
Hematocrit: 42 % (ref 34.0–46.6)
Hemoglobin: 13.7 g/dL (ref 11.1–15.9)
Immature Grans (Abs): 0 x10E3/uL (ref 0.0–0.1)
Immature Granulocytes: 0 %
Lymphocytes Absolute: 0.9 x10E3/uL (ref 0.7–3.1)
Lymphs: 24 %
MCH: 30.7 pg (ref 26.6–33.0)
MCHC: 32.6 g/dL (ref 31.5–35.7)
MCV: 94 fL (ref 79–97)
Monocytes Absolute: 0.2 x10E3/uL (ref 0.1–0.9)
Monocytes: 6 %
Neutrophils Absolute: 2.7 x10E3/uL (ref 1.4–7.0)
Neutrophils: 68 %
Platelets: 77 x10E3/uL — CL (ref 150–450)
RBC: 4.46 x10E6/uL (ref 3.77–5.28)
RDW: 12.2 % (ref 11.7–15.4)
WBC: 3.9 x10E3/uL (ref 3.4–10.8)

## 2024-07-09 LAB — LIPID PANEL
Chol/HDL Ratio: 3.2 ratio (ref 0.0–4.4)
Cholesterol, Total: 194 mg/dL (ref 100–199)
HDL: 61 mg/dL (ref >39)
LDL Chol Calc (NIH): 118 mg/dL — ABNORMAL HIGH (ref 0–99)
Triglycerides: 84 mg/dL (ref 0–149)
VLDL Cholesterol Cal: 15 mg/dL (ref 5–40)

## 2024-07-09 LAB — THYROID PANEL WITH TSH
Free Thyroxine Index: 2.5 (ref 1.2–4.9)
T3 Uptake Ratio: 23 % — ABNORMAL LOW (ref 24–39)
T4, Total: 11 ug/dL (ref 4.5–12.0)
TSH: 1.11 u[IU]/mL (ref 0.450–4.500)

## 2024-10-20 ENCOUNTER — Inpatient Hospital Stay

## 2024-10-27 ENCOUNTER — Ambulatory Visit: Admitting: Physician Assistant

## 2024-11-08 ENCOUNTER — Inpatient Hospital Stay: Admitting: Physician Assistant

## 2025-01-04 ENCOUNTER — Ambulatory Visit: Admitting: Nurse Practitioner
